# Patient Record
Sex: Female | Born: 1995 | Race: Black or African American | Hispanic: No | Marital: Single | Smoking: Never smoker
Health system: Southern US, Community
[De-identification: ages and names within clinical notes are randomized; demographics above are authoritative.]

## PROBLEM LIST (undated history)

## (undated) ENCOUNTER — Inpatient Hospital Stay (HOSPITAL_COMMUNITY): Payer: Self-pay

## (undated) ENCOUNTER — Ambulatory Visit

## (undated) DIAGNOSIS — F32A Depression, unspecified: Secondary | ICD-10-CM

## (undated) DIAGNOSIS — R87629 Unspecified abnormal cytological findings in specimens from vagina: Secondary | ICD-10-CM

## (undated) DIAGNOSIS — F419 Anxiety disorder, unspecified: Secondary | ICD-10-CM

## (undated) DIAGNOSIS — Z789 Other specified health status: Secondary | ICD-10-CM

## (undated) HISTORY — DX: Unspecified abnormal cytological findings in specimens from vagina: R87.629

## (undated) HISTORY — PX: NO PAST SURGERIES: SHX2092

## (undated) HISTORY — DX: Anxiety disorder, unspecified: F41.9

## (undated) HISTORY — DX: Depression, unspecified: F32.A

## (undated) HISTORY — PX: CERVICAL BIOPSY: SHX590

---
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
## 2016-10-22 ENCOUNTER — Ambulatory Visit: Payer: Medicaid Other | Admitting: Obstetrics and Gynecology

## 2016-11-12 ENCOUNTER — Ambulatory Visit: Payer: Medicaid Other | Admitting: Obstetrics and Gynecology

## 2016-12-15 ENCOUNTER — Inpatient Hospital Stay (HOSPITAL_COMMUNITY)
Admission: AD | Admit: 2016-12-15 | Discharge: 2016-12-15 | Disposition: A | Discharge status: Home / Self Care | Payer: Medicaid Other | Source: Ambulatory Visit | Attending: Obstetrics and Gynecology | Admitting: Obstetrics and Gynecology

## 2016-12-15 ENCOUNTER — Encounter (HOSPITAL_COMMUNITY): Payer: Self-pay | Admitting: *Deleted

## 2016-12-15 DIAGNOSIS — D649 Anemia, unspecified: Secondary | ICD-10-CM | POA: Insufficient documentation

## 2016-12-15 DIAGNOSIS — Z880 Allergy status to penicillin: Secondary | ICD-10-CM | POA: Insufficient documentation

## 2016-12-15 DIAGNOSIS — N939 Abnormal uterine and vaginal bleeding, unspecified: Secondary | ICD-10-CM | POA: Insufficient documentation

## 2016-12-15 LAB — URINALYSIS, ROUTINE W REFLEX MICROSCOPIC
BILIRUBIN URINE: NEGATIVE
Glucose, UA: NEGATIVE mg/dL
Ketones, ur: NEGATIVE mg/dL
LEUKOCYTES UA: NEGATIVE
NITRITE: NEGATIVE
Protein, ur: NEGATIVE mg/dL
SPECIFIC GRAVITY, URINE: 1.02 (ref 1.005–1.030)
pH: 5 (ref 5.0–8.0)

## 2016-12-15 LAB — CBC
HCT: 34.6 % — ABNORMAL LOW (ref 36.0–46.0)
HEMOGLOBIN: 11.5 g/dL — AB (ref 12.0–15.0)
MCH: 24.4 pg — AB (ref 26.0–34.0)
MCHC: 33.2 g/dL (ref 30.0–36.0)
MCV: 73.3 fL — ABNORMAL LOW (ref 78.0–100.0)
Platelets: 439 10*3/uL — ABNORMAL HIGH (ref 150–400)
RBC: 4.72 MIL/uL (ref 3.87–5.11)
RDW: 16.6 % — ABNORMAL HIGH (ref 11.5–15.5)
WBC: 5.8 10*3/uL (ref 4.0–10.5)

## 2016-12-15 LAB — WET PREP, GENITAL
Clue Cells Wet Prep HPF POC: NONE SEEN
SPERM: NONE SEEN
Trich, Wet Prep: NONE SEEN
Yeast Wet Prep HPF POC: NONE SEEN

## 2016-12-15 LAB — POCT PREGNANCY, URINE: PREG TEST UR: NEGATIVE

## 2016-12-15 MED ORDER — NORETHINDRONE-ETH ESTRADIOL 0.5-35 MG-MCG PO TABS: 1.0000 | ORAL_TABLET | Freq: Every day | ORAL | 0 refills | Status: DC

## 2016-12-15 NOTE — MAU Note (Addendum)
Has been feeling really weak and dizzy the last 2 days.  Has been bleeding a lot this month, has had "multiple periods". Was dx with a UTI a year ago, her momma had lost her card and she wasn't able to get the medication.  (gait steady and even when walking, did not require assistance)

## 2016-12-15 NOTE — Discharge Instructions (Signed)
Get your pills at the pharmacy and take one every day. Condoms for protections from sexually transmitted infections and protection from pregnancy. Drink at least 8 8-oz glasses of water every day. You can take ibuprofen 200 mg - take 3 tablets by mouth every 6 hours with food. Take a multivitamin every day. Eat 3 meals a day with healthy foods. Return if the bleeding worsens. [facility].

## 2016-12-15 NOTE — MAU Provider Note (Signed)
History     CSN: [id number]  Arrival date and time: 12/15/16 1721   First Provider Initiated Contact with Patient 12/15/16 1813      Chief Complaint  Patient presents with  . Vaginal Bleeding   HPI Rhonda Evans 21 y.o.  Comes to MAU today with heavy vaginal bleeding for the third time this month.  Was feeling weak and dizzy earlier today but now is OK.  Has not had a meal today but only a few bites of snacks today.  Bled first this month on the 12th, and then twice more for approx 3 days at a time.  Uses condoms (but not always) for contraception.  OB History    Gravida Para Term Preterm AB Living   1 1 1     1    SAB TAB Ectopic Multiple Live Births                  Past Medical History:  Diagnosis Date  . Medical history non-contributory     Past Surgical History:  Procedure Laterality Date  . NO PAST SURGERIES      Family History  Problem Relation Age of Onset  . Diabetes Maternal Grandfather   . Hypertension Maternal Grandfather   . Cancer Maternal Grandfather   . Cancer Paternal Grandfather   . Hypertension Maternal Aunt   . Diabetes Maternal Grandmother        great  . Hypertension Maternal Grandmother        great  . Hearing loss Neg Hx     Social History  Substance Use Topics  . Smoking status: Never Smoker  . Smokeless tobacco: Never Used  . Alcohol use No    Allergies:  Allergies  Allergen Reactions  . Amoxicillin Anaphylaxis and Rash    Has patient had a PCN reaction causing immediate rash, facial/tongue/throat swelling, SOB or lightheadedness with hypotension: Yes Has patient had a PCN reaction causing severe rash involving mucus membranes or skin necrosis: No Has patient had a PCN reaction that required hospitalization Yes Has patient had a PCN reaction occurring within the last 10 years: No If all of the above answers are "NO", then may proceed with Cephalosporin use.     Prescriptions Prior to Admission  Medication Sig Dispense  Refill Last Dose  . sulfamethoxazole-trimethoprim (BACTRIM DS,SEPTRA DS) 800-160 MG tablet Take 1 tablet by mouth 2 (two) times daily. 10 tablet 0   this was prescribed one year ago and she never got it filled due to a problem with her insurance.  Review of Systems  Constitutional: Negative for fever.  Gastrointestinal: Negative for nausea and vomiting.       Mild abdominal pain with the bleeding.  Genitourinary: Positive for vaginal bleeding. Negative for dysuria, urgency and vaginal discharge.   Physical Exam   Blood pressure 123/78, pulse 81, temperature 98.1 F (36.7 C), temperature source Oral, resp. rate 16, height 5\' 5"  (1.651 m), weight 161 lb 8 oz (73.3 kg), last menstrual period 12/13/2016, SpO2 100 %, unknown if currently breastfeeding.  Physical Exam  Nursing note and vitals reviewed. Constitutional: She is oriented to person, place, and time. She appears well-developed and well-nourished.  HENT:  Head: Normocephalic.  Eyes: EOM are normal.  Neck: Neck supple.  GI: Soft. There is no tenderness.  Genitourinary:  Genitourinary Comments: Speculum exam: Vagina - Mod amount of dark blood Cervix - small amount of active bleeding Bimanual exam: Cervix closed Uterus non tender, normal size Adnexa non tender,  no masses bilaterally GC/Chlam, wet prep done Chaperone present for exam.   Musculoskeletal: Normal range of motion.  Neurological: She is alert and oriented to person, place, and time.  Skin: Skin is warm and dry.  Psychiatric: She has a normal mood and affect.    MAU Course  Procedures Results for orders placed or performed during the hospital encounter of 12/15/16 (from the past 24 hour(s))  Urinalysis, Routine w reflex microscopic     Status: Abnormal   Collection Time: 12/15/16  5:37 PM  Result Value Ref Range   Color, Urine YELLOW YELLOW   APPearance CLEAR CLEAR   Specific Gravity, Urine 1.020 1.005 - 1.030   pH 5.0 5.0 - 8.0   Glucose, UA NEGATIVE  NEGATIVE mg/dL   Hgb urine dipstick LARGE (A) NEGATIVE   Bilirubin Urine NEGATIVE NEGATIVE   Ketones, ur NEGATIVE NEGATIVE mg/dL   Protein, ur NEGATIVE NEGATIVE mg/dL   Nitrite NEGATIVE NEGATIVE   Leukocytes, UA NEGATIVE NEGATIVE   RBC / HPF TOO NUMEROUS TO COUNT 0 - 5 RBC/hpf   WBC, UA 0-5 0 - 5 WBC/hpf   Bacteria, UA RARE (A) NONE SEEN   Squamous Epithelial / LPF 0-5 (A) NONE SEEN   Mucous PRESENT   Pregnancy, urine POC     Status: None   Collection Time: 12/15/16  5:45 PM  Result Value Ref Range   Preg Test, Ur NEGATIVE NEGATIVE  CBC     Status: Abnormal   Collection Time: 12/15/16  6:11 PM  Result Value Ref Range   WBC 5.8 4.0 - 10.5 K/uL   RBC 4.72 3.87 - 5.11 MIL/uL   Hemoglobin 11.5 (L) 12.0 - 15.0 g/dL   HCT 16.134.6 (L) 09.636.0 - 04.546.0 %   MCV 73.3 (L) 78.0 - 100.0 fL   MCH 24.4 (L) 26.0 - 34.0 pg   MCHC 33.2 30.0 - 36.0 g/dL   RDW 40.916.6 (H) 81.111.5 - 91.415.5 %   Platelets 439 (H) 150 - 400 K/uL  Wet prep, genital     Status: Abnormal   Collection Time: 12/15/16  6:12 PM  Result Value Ref Range   Yeast Wet Prep HPF POC NONE SEEN NONE SEEN   Trich, Wet Prep NONE SEEN NONE SEEN   Clue Cells Wet Prep HPF POC NONE SEEN NONE SEEN   WBC, Wet Prep HPF POC FEW (A) NONE SEEN   Sperm NONE SEEN     MDM Advised client she does not have a UTI and does not need a blood transfusion.  Likely the dizziness and weakness she had today is from lack of good nutrition and lack of fluids today.  Discussed the way to manage this episode of abnormal bleeding with birth control pills.  [facility]  Assessment and Plan  Abnormal vaginal bleeding Mild anemia  Plan Will prescribe birth control pills (x2 packs) to help the bleeding to stop.   Get your pills at the pharmacy and take one every day. Condoms for protections from sexually transmitted infections and protection from pregnancy. Drink at least 8 8-oz glasses of water every day. You can take ibuprofen 200 mg -  take 3 tablets by mouth every 6 hours with food. Take a multivitamin every day. Eat 3 meals a day with healthy foods. Return if the bleeding worsens. Labs pending. We will call you if you need any additional treatment. [facility] for further evaluation.  Maram Bently L Abrar Koone 12/15/2016, 6:15 PM

## 2016-12-16 LAB — HIV ANTIBODY (ROUTINE TESTING W REFLEX): HIV Screen 4th Generation wRfx: NONREACTIVE

## 2016-12-16 LAB — RPR: RPR Ser Ql: NONREACTIVE

## 2016-12-17 LAB — GC/CHLAMYDIA PROBE AMP (~~LOC~~) NOT AT ARMC
CHLAMYDIA, DNA PROBE: POSITIVE — AB
NEISSERIA GONORRHEA: NEGATIVE

## 2016-12-19 ENCOUNTER — Other Ambulatory Visit: Payer: Self-pay | Admitting: Student

## 2016-12-19 ENCOUNTER — Telehealth (HOSPITAL_COMMUNITY): Payer: Self-pay

## 2016-12-19 DIAGNOSIS — A749 Chlamydial infection, unspecified: Secondary | ICD-10-CM

## 2016-12-19 MED ORDER — AZITHROMYCIN 500 MG PO TABS: 1000.0000 mg | ORAL_TABLET | Freq: Once | ORAL | 0 refills | Status: AC

## 2016-12-19 NOTE — Telephone Encounter (Signed)

## 2017-05-27 ENCOUNTER — Encounter (HOSPITAL_COMMUNITY): Payer: Self-pay | Admitting: *Deleted

## 2017-05-27 ENCOUNTER — Inpatient Hospital Stay (HOSPITAL_COMMUNITY)
Admission: AD | Admit: 2017-05-27 | Discharge: 2017-05-27 | Disposition: A | Discharge status: Home / Self Care | Payer: Medicaid Other | Source: Ambulatory Visit | Attending: Family Medicine | Admitting: Family Medicine

## 2017-05-27 DIAGNOSIS — Z8249 Family history of ischemic heart disease and other diseases of the circulatory system: Secondary | ICD-10-CM | POA: Insufficient documentation

## 2017-05-27 DIAGNOSIS — A5602 Chlamydial vulvovaginitis: Secondary | ICD-10-CM | POA: Insufficient documentation

## 2017-05-27 DIAGNOSIS — Z3202 Encounter for pregnancy test, result negative: Secondary | ICD-10-CM | POA: Insufficient documentation

## 2017-05-27 DIAGNOSIS — Z822 Family history of deafness and hearing loss: Secondary | ICD-10-CM | POA: Insufficient documentation

## 2017-05-27 DIAGNOSIS — B9689 Other specified bacterial agents as the cause of diseases classified elsewhere: Secondary | ICD-10-CM | POA: Diagnosis not present

## 2017-05-27 DIAGNOSIS — Z8619 Personal history of other infectious and parasitic diseases: Secondary | ICD-10-CM

## 2017-05-27 DIAGNOSIS — Z809 Family history of malignant neoplasm, unspecified: Secondary | ICD-10-CM | POA: Diagnosis not present

## 2017-05-27 DIAGNOSIS — Z833 Family history of diabetes mellitus: Secondary | ICD-10-CM | POA: Insufficient documentation

## 2017-05-27 DIAGNOSIS — N760 Acute vaginitis: Secondary | ICD-10-CM

## 2017-05-27 DIAGNOSIS — Z880 Allergy status to penicillin: Secondary | ICD-10-CM | POA: Insufficient documentation

## 2017-05-27 DIAGNOSIS — A5402 Gonococcal vulvovaginitis, unspecified: Secondary | ICD-10-CM | POA: Insufficient documentation

## 2017-05-27 DIAGNOSIS — N898 Other specified noninflammatory disorders of vagina: Secondary | ICD-10-CM

## 2017-05-27 LAB — URINALYSIS, ROUTINE W REFLEX MICROSCOPIC
Bilirubin Urine: NEGATIVE
GLUCOSE, UA: NEGATIVE mg/dL
Ketones, ur: NEGATIVE mg/dL
NITRITE: NEGATIVE
PH: 5 (ref 5.0–8.0)
Protein, ur: NEGATIVE mg/dL
SPECIFIC GRAVITY, URINE: 1.021 (ref 1.005–1.030)

## 2017-05-27 LAB — WET PREP, GENITAL
Sperm: NONE SEEN
Trich, Wet Prep: NONE SEEN
YEAST WET PREP: NONE SEEN

## 2017-05-27 LAB — POCT PREGNANCY, URINE: Preg Test, Ur: NEGATIVE

## 2017-05-27 MED ORDER — AZITHROMYCIN 250 MG PO TABS: ORAL_TABLET | ORAL | 0 refills | Status: DC

## 2017-05-27 MED ORDER — METRONIDAZOLE 500 MG PO TABS: 500.0000 mg | ORAL_TABLET | Freq: Two times a day (BID) | ORAL | 0 refills | Status: DC

## 2017-05-27 NOTE — MAU Provider Note (Signed)
History     CSN: [id number]  Arrival date and time: 05/27/17 1306   First Provider Initiated Contact with Patient 05/27/17 1338      Chief Complaint  Patient presents with  . Possible Pregnancy  . Vaginal Discharge   HPI .  Rhonda Evans presents for evaluation of vaginal discharge and odor for several weeks.  Recent hx of + Chlamydia, treated.  Boyfriend is aware and she cannot be sure he was treated "because he didn't talk about medication".  She is not currently using OCPs, that she has had in the past.  She is having mild nausea.  Hx of irregular cycles, LMP over a month ago.  Not sure if she pregnant.     Past Medical History:  Diagnosis Date  . Medical history non-contributory     Past Surgical History:  Procedure Laterality Date  . NO PAST SURGERIES      Family History  Problem Relation Age of Onset  . Diabetes Maternal Grandfather   . Hypertension Maternal Grandfather   . Cancer Maternal Grandfather   . Cancer Paternal Grandfather   . Hypertension Maternal Aunt   . Diabetes Maternal Grandmother        great  . Hypertension Maternal Grandmother        great  . Hearing loss Neg Hx     Social History   Tobacco Use  . Smoking status: Never Smoker  . Smokeless tobacco: Never Used  Substance Use Topics  . Alcohol use: No  . Drug use: No    Allergies:  Allergies  Allergen Reactions  . Amoxicillin Anaphylaxis and Rash    Has patient had a PCN reaction causing immediate rash, facial/tongue/throat swelling, SOB or lightheadedness with hypotension: Yes Has patient had a PCN reaction causing severe rash involving mucus membranes or skin necrosis: No Has patient had a PCN reaction that required hospitalization Yes Has patient had a PCN reaction occurring within the last 10 years: No If all of the above answers are "NO", then may proceed with Cephalosporin use.     Medications Prior to Admission  Medication Sig Dispense Refill Last Dose  .  norethindrone-ethinyl estradiol (NECON,BREVICON,MODICON) 0.5-35 MG-MCG tablet Take 1 tablet by mouth daily. (Patient not taking: Reported on 05/27/2017) 2 Package 0 Not Taking at Unknown time    Review of Systems  Constitutional: Negative for chills and fever.  Respiratory: Negative for shortness of breath.   Cardiovascular: Negative for chest pain.  Gastrointestinal: Positive for nausea (mild). Negative for abdominal pain, diarrhea and vomiting.  Genitourinary: Positive for vaginal discharge (with odor). Negative for difficulty urinating, dysuria, frequency, hematuria, pelvic pain and vaginal bleeding.   Physical Exam   Blood pressure 127/76, pulse 94, temperature 98.6 F (37 C), temperature source Oral, resp. rate 16, height 5\' 5"  (1.651 m), weight 165 lb (74.8 kg), SpO2 100 %, unknown if currently breastfeeding.  Physical Exam  Nursing note and vitals reviewed. Constitutional: She is oriented to person, place, and time. She appears well-developed and well-nourished. No distress.  HENT:  Head: Normocephalic.  Neck: Normal range of motion.  Cardiovascular: Normal rate.  Respiratory: Effort normal.  GI: Soft. She exhibits no distension and no mass. There is no tenderness. There is no rebound and no guarding.  Genitourinary: There is no rash, tenderness or lesion on the right labia. There is no rash, tenderness or lesion on the left labia. Uterus is not enlarged and not tender. Cervix exhibits no motion tenderness and no discharge. Right  adnexum displays no mass, no tenderness and no fullness. Left adnexum displays no mass, no tenderness and no fullness. No erythema, tenderness or bleeding in the vagina. No foreign body in the vagina. Vaginal discharge (moderate amount of white frothy discharge with mild odor) found.  Neurological: She is alert and oriented to person, place, and time.  Skin: Skin is warm and dry.  Psychiatric: She has a normal mood and affect. Her behavior is normal. Thought  content normal.   Results for orders placed or performed during the hospital encounter of 05/27/17 (from the past 24 hour(s))  Urinalysis, Routine w reflex microscopic     Status: Abnormal   Collection Time: 05/27/17  1:09 PM  Result Value Ref Range   Color, Urine YELLOW YELLOW   APPearance HAZY (A) CLEAR   Specific Gravity, Urine 1.021 1.005 - 1.030   pH 5.0 5.0 - 8.0   Glucose, UA NEGATIVE NEGATIVE mg/dL   Hgb urine dipstick SMALL (A) NEGATIVE   Bilirubin Urine NEGATIVE NEGATIVE   Ketones, ur NEGATIVE NEGATIVE mg/dL   Protein, ur NEGATIVE NEGATIVE mg/dL   Nitrite NEGATIVE NEGATIVE   Leukocytes, UA LARGE (A) NEGATIVE   RBC / HPF 0-5 0 - 5 RBC/hpf   WBC, UA TOO NUMEROUS TO COUNT 0 - 5 WBC/hpf   Bacteria, UA FEW (A) NONE SEEN   Squamous Epithelial / LPF 6-30 (A) NONE SEEN   Mucus PRESENT   Pregnancy, urine POC     Status: None   Collection Time: 05/27/17  1:25 PM  Result Value Ref Range   Preg Test, Ur NEGATIVE NEGATIVE  Wet prep, genital     Status: Abnormal   Collection Time: 05/27/17  3:00 PM  Result Value Ref Range   Yeast Wet Prep HPF POC NONE SEEN NONE SEEN   Trich, Wet Prep NONE SEEN NONE SEEN   Clue Cells Wet Prep HPF POC PRESENT (A) NONE SEEN   WBC, Wet Prep HPF POC MANY (A) NONE SEEN   Sperm NONE SEEN    MAU Course  Procedures  GC/CHL culture and HIV pending  MDM MSE Exam Labs Discussed with the patient retreating for Chlamydia with the concern her partner may not hve been treated vs in case he was treated, waiting for the culture result.  She would like to be treated  Assessment and Plan  A:  Vaginal discharge with odor      Bacterial vaginosis      Recent dx of Chlamydia, elsewhere    P:  Rx for Flagyl and Zithromax to pharmacy        ETOH warning with Flagyl      Will call for + culture results      Pelvic rest until 1 week after partner is treated if + culture     Encouraged contraception  Eve M Derrisha Foos 05/27/2017, 3:32 PM

## 2017-05-27 NOTE — MAU Note (Cosign Needed)
History     CSN: [id number]  Arrival date and time: 05/27/17 1306   First Provider Initiated Contact with Patient 05/27/17 1338      Chief Complaint  Patient presents with  . Possible Pregnancy  . Vaginal Discharge    21 y.o G1P1 female with prior history of Chlaymdia infection presents today with complaints of vaginal discharge and odor with intermittent nausea. States this has all been going on for "several weeks, maybe a month." Reports discharge has increased in amount, described as white/clear in color. Patient is sexually active and was treated for Chlamydia recently but states she did not wait the appropriate time before engaging in intercourse again. Denies any form of birth control currently. Patient is unsure if partner was treated, stating he said she went but she isn't sure if he took medication. Patient does not know LMP, but reports it to be irregular and heavy. Denies fever, chills, shortness of breath, abdominal pain, diarrhea, constipation, vaginal bleeding, vaginal itching, hematuria, or dysuria.   OB History    Gravida Para Term Preterm AB Living   1 1 1     1    SAB TAB Ectopic Multiple Live Births                  Past Medical History:  Diagnosis Date  . Medical history non-contributory     Past Surgical History:  Procedure Laterality Date  . NO PAST SURGERIES      Family History  Problem Relation Age of Onset  . Diabetes Maternal Grandfather   . Hypertension Maternal Grandfather   . Cancer Maternal Grandfather   . Cancer Paternal Grandfather   . Hypertension Maternal Aunt   . Diabetes Maternal Grandmother        great  . Hypertension Maternal Grandmother        great  . Hearing loss Neg Hx     Social History   Tobacco Use  . Smoking status: Never Smoker  . Smokeless tobacco: Never Used  Substance Use Topics  . Alcohol use: No  . Drug use: No    Allergies:  Allergies  Allergen Reactions  . Amoxicillin Anaphylaxis and Rash    Has  patient had a PCN reaction causing immediate rash, facial/tongue/throat swelling, SOB or lightheadedness with hypotension: Yes Has patient had a PCN reaction causing severe rash involving mucus membranes or skin necrosis: No Has patient had a PCN reaction that required hospitalization Yes Has patient had a PCN reaction occurring within the last 10 years: No If all of the above answers are "NO", then may proceed with Cephalosporin use.     Medications Prior to Admission  Medication Sig Dispense Refill Last Dose  . norethindrone-ethinyl estradiol (NECON,BREVICON,MODICON) 0.5-35 MG-MCG tablet Take 1 tablet by mouth daily. (Patient not taking: Reported on 05/27/2017) 2 Package 0 Not Taking at Unknown time    Review of Systems  Constitutional: Negative for chills, fatigue and fever.  Respiratory: Negative for chest tightness and shortness of breath.   Cardiovascular: Negative for palpitations and leg swelling.  Gastrointestinal: Positive for nausea. Negative for abdominal pain, constipation, diarrhea and vomiting.  Genitourinary: Positive for vaginal discharge. Negative for dysuria, hematuria, pelvic pain and vaginal bleeding.       Positive for Vaginal odor.   All other systems reviewed and are negative.  Physical Exam   Blood pressure 127/76, pulse 94, temperature 98.6 F (37 C), temperature source Oral, resp. rate 16, height 5\' 5"  (1.651 m), weight 74.8 kg (  165 lb), SpO2 100 %, unknown if currently breastfeeding.  Physical Exam  Constitutional: She appears well-developed and well-nourished.  HENT:  Head: Normocephalic and atraumatic.  Cardiovascular: Normal rate.  Respiratory: Effort normal.  GI: Soft. There is no tenderness.  Genitourinary: Cervix exhibits no motion tenderness. Right adnexum displays no mass and no tenderness. Left adnexum displays no mass and no tenderness. Vaginal discharge found.  Genitourinary Comments: RN chaperone present.   Skin: Skin is warm and dry.      Results for orders placed or performed during the hospital encounter of 05/27/17 (from the past 24 hour(s))  Urinalysis, Routine w reflex microscopic     Status: Abnormal   Collection Time: 05/27/17  1:09 PM  Result Value Ref Range   Color, Urine YELLOW YELLOW   APPearance HAZY (A) CLEAR   Specific Gravity, Urine 1.021 1.005 - 1.030   pH 5.0 5.0 - 8.0   Glucose, UA NEGATIVE NEGATIVE mg/dL   Hgb urine dipstick SMALL (A) NEGATIVE   Bilirubin Urine NEGATIVE NEGATIVE   Ketones, ur NEGATIVE NEGATIVE mg/dL   Protein, ur NEGATIVE NEGATIVE mg/dL   Nitrite NEGATIVE NEGATIVE   Leukocytes, UA LARGE (A) NEGATIVE   RBC / HPF 0-5 0 - 5 RBC/hpf   WBC, UA TOO NUMEROUS TO COUNT 0 - 5 WBC/hpf   Bacteria, UA FEW (A) NONE SEEN   Squamous Epithelial / LPF 6-30 (A) NONE SEEN   Mucus PRESENT   Pregnancy, urine POC     Status: None   Collection Time: 05/27/17  1:25 PM  Result Value Ref Range   Preg Test, Ur NEGATIVE NEGATIVE    MAU Course  Procedures G/C culture: Pending   MDM  Assessment and Plan  Vaginal discharge and odor: G/C cultures pending. Patient informed she would receive a call with those results. If positive, patient encouraged to seek treatment for herself and her partner and to remain abstinent x1 week following appropriate treatment. Patient also encouraged to seek out a method of contraception.   Felisa BonierBianca Seivley 05/27/2017, 2:55 PM

## 2017-05-27 NOTE — Discharge Instructions (Signed)
Bacterial Vaginosis Bacterial vaginosis is an infection of the vagina. It happens when too many germs (bacteria) grow in the vagina. This infection puts you at risk for infections from sex (STIs). Treating this infection can lower your risk for some STIs. You should also treat this if you are pregnant. It can cause your baby to be born early. Follow these instructions at home: Medicines  Take over-the-counter and prescription medicines only as told by your doctor.  Take or use your antibiotic medicine as told by your doctor. Do not stop taking or using it even if you start to feel better. General instructions  If you your sexual partner is a woman, tell her that you have this infection. She needs to get treatment if she has symptoms. If you have a female partner, he does not need to be treated.  During treatment: ? Avoid sex. ? Do not douche. ? Avoid alcohol as told. ? Avoid breastfeeding as told.  Drink enough fluid to keep your pee (urine) clear or pale yellow.  Keep your vagina and butt (rectum) clean. ? Wash the area with warm water every day. ? Wipe from front to back after you use the toilet.  Keep all follow-up visits as told by your doctor. This is important. Preventing this condition  Do not douche.  Use only warm water to wash around your vagina.  Use protection when you have sex. This includes: ? Latex condoms. ? Dental dams.  Limit how many people you have sex with. It is best to only have sex with the same person (be monogamous).  Get tested for STIs. Have your partner get tested.  Wear underwear that is cotton or lined with cotton.  Avoid tight pants and pantyhose. This is most important in summer.  Do not use any products that have nicotine or tobacco in them. These include cigarettes and e-cigarettes. If you need help quitting, ask your doctor.  Do not use illegal drugs.  Limit how much alcohol you drink. Contact a doctor if:  Your symptoms do not get  better, even after you are treated.  You have more discharge or pain when you pee (urinate).  You have a fever.  You have pain in your belly (abdomen).  You have pain with sex.  Your bleed from your vagina between periods. Summary  This infection happens when too many germs (bacteria) grow in the vagina.  Treating this condition can lower your risk for some infections from sex (STIs).  You should also treat this if you are pregnant. It can cause early (premature) birth.  Do not stop taking or using your antibiotic medicine even if you start to feel better. This information is not intended to replace advice given to you by your health care provider. Make sure you discuss any questions you have with your health care provider. Document Released: 03/20/2008 Document Revised: 02/25/2016 Document Reviewed: 02/25/2016 Elsevier Interactive Patient Education  2017 [employer]  Chlamydia, Female Chlamydia is an STD (sexually transmitted disease). It is a bacterial infection that spreads (is contagious) through sexual contact. Chlamydia can occur in different areas of the body, including:  The tube that moves urine from the bladder out of the body (urethra).  The lower part of the uterus (cervix).  The throat.  The rectum.  This condition is not difficult to treat. However, if left untreated, chlamydia can lead to more serious health problems, including pelvic inflammatory disorder (PID). PID can increase your risk of not being able to  have children (sterility). What are the causes? Chlamydia is caused by the bacteria Chlamydia trachomatis. It is passed from an infected partner during sexual activity. Chlamydia can spread through contact with the genitals, mouth, or rectum. What are the signs or symptoms? In some cases, there may not be any symptoms for this condition (asymptomatic), especially early in the infection. If symptoms develop, they may include:  Burning with  urination.  Frequent urination.  Vaginal discharge.  Redness, soreness, and swelling (inflammation) of the rectum.  Bleeding or discharge from the rectum.  Abdominal pain.  Pain during sexual intercourse.  Bleeding between menstrual periods.  Itching, burning, or redness in the eyes, or discharge from the eyes.  How is this diagnosed? This condition may be diagnosed with:  Urine tests.  Swab tests. Depending on your symptoms, your health care provider may use a cotton swab to collect discharge from your vagina or rectum to test for the bacteria.  A pelvic exam.  How is this treated? This condition is treated with antibiotic medicines. If you are pregnant, certain types of antibiotics will need to be avoided. Follow these instructions at home: Medicines  Take over-the-counter and prescription medicines only as told by your health care provider.  Take your antibiotic medicine as told by your health care provider. Do not stop taking the antibiotic even if you start to feel better. Sexual activity  Tell sexual partners about your infection. This includes any oral, anal, or vaginal sex partners you have had within 60 days of when your symptoms started. Sexual partners should also be treated, even if they have no signs of the disease.  Do not have sex until you and your sexual partners have completed treatment and your health care provider says it is okay. If your health care provider prescribed you a single dose treatment, wait 7 days after taking the treatment before having sex. General instructions  It is your responsibility to get your test results. Ask your health care provider, or the department performing the test, when your results will be ready.  Get plenty of rest.  Eat a healthy, well-balanced diet.  Drink enough fluids to keep your urine clear or pale yellow.  Keep all follow-up visits as told by your health care provider. This is important. You may need to be  tested for infection again 3 months after treatment. How is this prevented? The only sure way to prevent chlamydia is to avoid having sex. However, you can lower your risk by:  Using latex condoms correctly every time you have sex.  Not having multiple sexual partners.  Asking if your sexual partner has been tested for STIs and had negative results.  Contact a health care provider if:  You develop new symptoms or your symptoms do not get better after completing treatment.  You have a fever or chills.  You have pain during sexual intercourse. Get help right away if:  Your pain gets worse and does not get better with medicine.  You develop flu-like symptoms, such as night sweats, sore throat, or muscle aches.  You experience nausea or vomiting.  You have difficulty swallowing.  You have bleeding between periods or after sex.  You have irregular menstrual periods.  You have abdominal or lower back pain that does not get better with medicine.  You feel weak or dizzy, or you faint.  You are pregnant and you develop symptoms of chlamydia. Summary  Chlamydia is an STD (sexually transmitted disease). It is a bacterial infection  that spreads (is contagious) through sexual contact.  This condition is not difficult to treat, however. If left untreated, chlamydia can lead to more serious health problems, including pelvic inflammatory disease (PID).  In some cases, there may not be any symptoms for this condition (asymptomatic).  This condition is treated with antibiotic medicines.  Using latex condoms correctly every time you have sex can help prevent chlamydia. This information is not intended to replace advice given to you by your health care provider. Make sure you discuss any questions you have with your health care provider. Document Released: 03/21/2005 Document Revised: 05/28/2016 Document Reviewed: 05/28/2016 Elsevier Interactive Patient Education  2017 [employer]

## 2017-05-27 NOTE — MAU Note (Signed)
Pt reports discharge with a odor and wants to be checked for STD's. Denies pain. Also thinks she may be pregnant, reports nausea off/on.

## 2017-05-28 LAB — HIV ANTIBODY (ROUTINE TESTING W REFLEX): HIV SCREEN 4TH GENERATION: NONREACTIVE

## 2017-05-29 LAB — GC/CHLAMYDIA PROBE AMP (~~LOC~~) NOT AT ARMC
Chlamydia: POSITIVE — AB
NEISSERIA GONORRHEA: POSITIVE — AB

## 2017-07-19 ENCOUNTER — Encounter (HOSPITAL_COMMUNITY): Payer: Self-pay | Admitting: Emergency Medicine

## 2017-07-19 ENCOUNTER — Emergency Department (HOSPITAL_COMMUNITY): Payer: Medicaid Other

## 2017-07-19 DIAGNOSIS — S93401A Sprain of unspecified ligament of right ankle, initial encounter: Secondary | ICD-10-CM | POA: Diagnosis not present

## 2017-07-19 DIAGNOSIS — Y999 Unspecified external cause status: Secondary | ICD-10-CM | POA: Insufficient documentation

## 2017-07-19 DIAGNOSIS — Y929 Unspecified place or not applicable: Secondary | ICD-10-CM | POA: Insufficient documentation

## 2017-07-19 DIAGNOSIS — W108XXA Fall (on) (from) other stairs and steps, initial encounter: Secondary | ICD-10-CM | POA: Insufficient documentation

## 2017-07-19 DIAGNOSIS — Y939 Activity, unspecified: Secondary | ICD-10-CM | POA: Insufficient documentation

## 2017-07-19 DIAGNOSIS — S99911A Unspecified injury of right ankle, initial encounter: Secondary | ICD-10-CM | POA: Diagnosis present

## 2017-07-19 NOTE — ED Triage Notes (Signed)
Pt reports she tripped and fell down 3-4 stairs. Pt reports R ankle pain. Pt is ambulatory.

## 2017-07-20 ENCOUNTER — Emergency Department (HOSPITAL_COMMUNITY)
Admission: EM | Admit: 2017-07-20 | Discharge: 2017-07-20 | Disposition: A | Discharge status: Home / Self Care | Payer: Medicaid Other | Attending: Emergency Medicine | Admitting: Emergency Medicine

## 2017-07-20 DIAGNOSIS — S93401A Sprain of unspecified ligament of right ankle, initial encounter: Secondary | ICD-10-CM

## 2017-07-20 MED ORDER — IBUPROFEN 600 MG PO TABS: 600.0000 mg | ORAL_TABLET | Freq: Four times a day (QID) | ORAL | 0 refills | Status: DC | PRN

## 2017-07-20 NOTE — ED Provider Notes (Signed)
[facility] EMERGENCY DEPARTMENT Provider Note   CSN: [id number] Arrival date & time: 07/19/17  2246     History   Chief Complaint Chief Complaint  Patient presents with  . Ankle Pain    HPI Rhonda Evans is a 22 y.o. female.  Patient presents to the emergency department with a chief complaint of right ankle pain.  She states that she tripped and fell down 3-4 stairs.  She injured her right ankle during the fall.  She complains of pain with palpation and movement.  It is difficult to ambulate on the affected ankle.  Did not take anything for symptoms.  Denies any other injuries.   The history is provided by the patient. No language interpreter was used.    Past Medical History:  Diagnosis Date  . Medical history non-contributory     There are no active problems to display for this patient.   Past Surgical History:  Procedure Laterality Date  . NO PAST SURGERIES      OB History    Gravida Para Term Preterm AB Living   1 1 1     1    SAB TAB Ectopic Multiple Live Births                   Home Medications    Prior to Admission medications   Medication Sig Start Date End Date Taking? Authorizing Provider  azithromycin (ZITHROMAX) 250 MG tablet Take all 4 tabs at once with food. 05/27/17   Key, Verita Schneiders, NP  metroNIDAZOLE (FLAGYL) 500 MG tablet Take 1 tablet (500 mg total) by mouth 2 (two) times daily. 05/27/17   KeyVerita Schneiders, NP  norethindrone-ethinyl estradiol (NECON,BREVICON,MODICON) 0.5-35 MG-MCG tablet Take 1 tablet by mouth daily. Patient not taking: Reported on 05/27/2017 12/15/16   Currie Paris, NP    Family History Family History  Problem Relation Age of Onset  . Diabetes Maternal Grandfather   . Hypertension Maternal Grandfather   . Cancer Maternal Grandfather   . Cancer Paternal Grandfather   . Hypertension Maternal Aunt   . Diabetes Maternal Grandmother        great  . Hypertension Maternal Grandmother        great  .  Hearing loss Neg Hx     Social History Social History   Tobacco Use  . Smoking status: Never Smoker  . Smokeless tobacco: Never Used  Substance Use Topics  . Alcohol use: No  . Drug use: No     Allergies   Amoxicillin   Review of Systems Review of Systems  All other systems reviewed and are negative.    Physical Exam Updated Vital Signs BP 125/82   Pulse (!) 115   Temp 99.2 F (37.3 C) (Oral)   Resp 16   Ht 5\' 5"  (1.651 m)   Wt 72.6 kg (160 lb)   LMP  (Within Months)   SpO2 95%   BMI 26.63 kg/m   Physical Exam Nursing note and vitals reviewed.  Constitutional: Pt appears well-developed and well-nourished. No distress.  HENT:  Head: Normocephalic and atraumatic.  Eyes: Conjunctivae are normal.  Neck: Normal range of motion.  Cardiovascular: Normal rate, regular rhythm. Intact distal pulses.   Capillary refill < 3 sec.  Pulmonary/Chest: Effort normal and breath sounds normal.  Musculoskeletal:  Right ankle Pt exhibits TTP about the right ankle without bony abnormality or deformity.   ROM: limited by pain  Strength: limited by pain  Neurological: Pt  is alert. Coordination normal.  Sensation: 5/5 Skin: Skin is warm and dry. Pt is not diaphoretic.  No evidence of open wound or skin tenting Psychiatric: Pt has a normal mood and affect.     ED Treatments / Results  Labs (all labs ordered are listed, but only abnormal results are displayed) Labs Reviewed - No data to display  EKG  EKG Interpretation None       Radiology Dg Ankle Complete Right  Result Date: 07/20/2017 CLINICAL DATA:  Larey SeatFell down the stairs now with ankle pain EXAM: RIGHT ANKLE - COMPLETE 3+ VIEW COMPARISON:  None. FINDINGS: There is no evidence of fracture, dislocation, or joint effusion. There is no evidence of arthropathy or other focal bone abnormality. Soft tissues are unremarkable. IMPRESSION: Negative. Electronically Signed   By: Jasmine PangKim  Fujinaga M.D.   On: 07/20/2017 00:04     Procedures Procedures (including critical care time)  Medications Ordered in ED Medications - No data to display   Initial Impression / Assessment and Plan / ED Course  I have reviewed the triage vital signs and the nursing notes.  Pertinent labs & imaging results that were available during my care of the patient were reviewed by me and considered in my medical decision making (see chart for details).     Patient presents with injury to right ankle.  DDx includes, fracture, strain, or sprain.  Plain films reveal no fracture or dislocation.  Pt advised to follow up with PCP and/or orthopedics. Patient given splint and crutches while in ED, conservative therapy such as RICE recommended and discussed.   Patient will be discharged home & is agreeable with above plan. Returns precautions discussed. Pt appears safe for discharge.   Final Clinical Impressions(s) / ED Diagnoses   Final diagnoses:  Sprain of right ankle, unspecified ligament, initial encounter    ED Discharge Orders        Ordered    ibuprofen (ADVIL,MOTRIN) 600 MG tablet  Every 6 hours PRN     07/20/17 0313       Roxy HorsemanBrowning, Javoni Lucken, PA-C 07/20/17 [id number]    Azalia Bilisampos, Kevin, MD 07/20/17 [phone]

## 2017-11-19 ENCOUNTER — Encounter (HOSPITAL_COMMUNITY): Payer: Self-pay | Admitting: Emergency Medicine

## 2017-11-19 ENCOUNTER — Emergency Department (HOSPITAL_COMMUNITY)
Admission: EM | Admit: 2017-11-19 | Discharge: 2017-11-20 | Disposition: A | Discharge status: Home / Self Care | Payer: Medicaid Other | Attending: Emergency Medicine | Admitting: Emergency Medicine

## 2017-11-19 DIAGNOSIS — Z5321 Procedure and treatment not carried out due to patient leaving prior to being seen by health care provider: Secondary | ICD-10-CM | POA: Diagnosis not present

## 2017-11-19 DIAGNOSIS — R1030 Lower abdominal pain, unspecified: Secondary | ICD-10-CM | POA: Insufficient documentation

## 2017-11-19 DIAGNOSIS — N898 Other specified noninflammatory disorders of vagina: Secondary | ICD-10-CM | POA: Insufficient documentation

## 2017-11-19 LAB — CBC
HCT: 35.1 % — ABNORMAL LOW (ref 36.0–46.0)
Hemoglobin: 11.5 g/dL — ABNORMAL LOW (ref 12.0–15.0)
MCH: 24.3 pg — ABNORMAL LOW (ref 26.0–34.0)
MCHC: 32.8 g/dL (ref 30.0–36.0)
MCV: 74.1 fL — ABNORMAL LOW (ref 78.0–100.0)
Platelets: 396 10*3/uL (ref 150–400)
RBC: 4.74 MIL/uL (ref 3.87–5.11)
RDW: 16 % — ABNORMAL HIGH (ref 11.5–15.5)
WBC: 10.9 10*3/uL — ABNORMAL HIGH (ref 4.0–10.5)

## 2017-11-19 LAB — URINALYSIS, ROUTINE W REFLEX MICROSCOPIC
Bacteria, UA: NONE SEEN
Bilirubin Urine: NEGATIVE
Glucose, UA: NEGATIVE mg/dL
Ketones, ur: NEGATIVE mg/dL
Nitrite: NEGATIVE
Protein, ur: NEGATIVE mg/dL
Specific Gravity, Urine: 1.027 (ref 1.005–1.030)
pH: 6 (ref 5.0–8.0)

## 2017-11-19 LAB — COMPREHENSIVE METABOLIC PANEL
ALT: 11 U/L — ABNORMAL LOW (ref 14–54)
AST: 16 U/L (ref 15–41)
Albumin: 3.6 g/dL (ref 3.5–5.0)
Alkaline Phosphatase: 75 U/L (ref 38–126)
Anion gap: 9 (ref 5–15)
BUN: 8 mg/dL (ref 6–20)
CO2: 26 mmol/L (ref 22–32)
Calcium: 9.5 mg/dL (ref 8.9–10.3)
Chloride: 105 mmol/L (ref 101–111)
Creatinine, Ser: 0.79 mg/dL (ref 0.44–1.00)
GFR calc Af Amer: >60 mL/min (ref >60)
GFR calc non Af Amer: >60 mL/min (ref >60)
Glucose, Bld: 79 mg/dL (ref 65–99)
Potassium: 3.7 mmol/L (ref 3.5–5.1)
Sodium: 140 mmol/L (ref 135–145)
Total Bilirubin: 0.2 mg/dL — ABNORMAL LOW (ref 0.3–1.2)
Total Protein: 7.5 g/dL (ref 6.5–8.1)

## 2017-11-19 LAB — I-STAT BETA HCG BLOOD, ED (MC, WL, AP ONLY): I-stat hCG, quantitative: <5 m[IU]/mL (ref <5)

## 2017-11-19 LAB — LIPASE, BLOOD: Lipase: 25 U/L (ref 11–51)

## 2017-11-19 NOTE — ED Triage Notes (Signed)
Pt reports lower abd pain X several weeks, intermittent, sharp in nature. Also reports abnormal vaginal DC.

## 2017-11-20 LAB — RPR: RPR Ser Ql: NONREACTIVE

## 2017-11-20 LAB — HIV ANTIBODY (ROUTINE TESTING W REFLEX): HIV Screen 4th Generation wRfx: NONREACTIVE

## 2017-11-20 NOTE — ED Notes (Signed)
Follow up call made   1215  11/20/17  s Monay Houlton rn

## 2017-11-20 NOTE — ED Notes (Signed)
No reply for vitals x4 pt not seen in lobby at this time.

## 2017-11-22 ENCOUNTER — Encounter (HOSPITAL_COMMUNITY): Payer: Self-pay | Admitting: *Deleted

## 2017-11-22 ENCOUNTER — Inpatient Hospital Stay (HOSPITAL_COMMUNITY)
Admission: AD | Admit: 2017-11-22 | Discharge: 2017-11-22 | Disposition: A | Discharge status: Home / Self Care | Payer: Medicaid Other | Source: Ambulatory Visit | Attending: Obstetrics and Gynecology | Admitting: Obstetrics and Gynecology

## 2017-11-22 DIAGNOSIS — Z880 Allergy status to penicillin: Secondary | ICD-10-CM | POA: Diagnosis not present

## 2017-11-22 DIAGNOSIS — N3000 Acute cystitis without hematuria: Secondary | ICD-10-CM | POA: Diagnosis not present

## 2017-11-22 DIAGNOSIS — N760 Acute vaginitis: Secondary | ICD-10-CM

## 2017-11-22 DIAGNOSIS — R109 Unspecified abdominal pain: Secondary | ICD-10-CM | POA: Diagnosis present

## 2017-11-22 DIAGNOSIS — B9689 Other specified bacterial agents as the cause of diseases classified elsewhere: Secondary | ICD-10-CM | POA: Diagnosis not present

## 2017-11-22 DIAGNOSIS — N730 Acute parametritis and pelvic cellulitis: Secondary | ICD-10-CM | POA: Diagnosis not present

## 2017-11-22 DIAGNOSIS — Z79899 Other long term (current) drug therapy: Secondary | ICD-10-CM | POA: Insufficient documentation

## 2017-11-22 LAB — URINALYSIS, ROUTINE W REFLEX MICROSCOPIC
BILIRUBIN URINE: NEGATIVE
GLUCOSE, UA: NEGATIVE mg/dL
Ketones, ur: NEGATIVE mg/dL
NITRITE: POSITIVE — AB
PH: 5 (ref 5.0–8.0)
Protein, ur: NEGATIVE mg/dL
SPECIFIC GRAVITY, URINE: 1.028 (ref 1.005–1.030)

## 2017-11-22 LAB — WET PREP, GENITAL
Sperm: NONE SEEN
Trich, Wet Prep: NONE SEEN
YEAST WET PREP: NONE SEEN

## 2017-11-22 LAB — POCT PREGNANCY, URINE: Preg Test, Ur: NEGATIVE

## 2017-11-22 MED ORDER — DIPHENHYDRAMINE HCL 25 MG PO CAPS
25.0000 mg | ORAL_CAPSULE | Freq: Once | ORAL | Status: AC
  Administered 2017-11-22: 25 mg via ORAL
  Filled 2017-11-22: qty 1

## 2017-11-22 MED ORDER — SECNIDAZOLE 2 G PO PACK: 2.0000 g | PACK | Freq: Once | ORAL | 0 refills | Status: AC

## 2017-11-22 MED ORDER — KETOROLAC TROMETHAMINE 60 MG/2ML IM SOLN
60.0000 mg | Freq: Once | INTRAMUSCULAR | Status: AC
  Administered 2017-11-22: 60 mg via INTRAMUSCULAR
  Filled 2017-11-22: qty 2

## 2017-11-22 MED ORDER — ACETAMINOPHEN 500 MG PO TABS
1000.0000 mg | ORAL_TABLET | Freq: Once | ORAL | Status: AC
Start: 2017-11-22 — End: 2017-11-22
  Administered 2017-11-22: 1000 mg via ORAL
  Filled 2017-11-22: qty 2

## 2017-11-22 MED ORDER — DOXYCYCLINE HYCLATE 100 MG PO TABS: 100.0000 mg | ORAL_TABLET | Freq: Two times a day (BID) | ORAL | 0 refills | Status: DC

## 2017-11-22 MED ORDER — CEFTRIAXONE SODIUM 250 MG IJ SOLR
250.0000 mg | Freq: Once | INTRAMUSCULAR | Status: AC
  Administered 2017-11-22: 250 mg via INTRAMUSCULAR
  Filled 2017-11-22: qty 250

## 2017-11-22 MED ORDER — SULFAMETHOXAZOLE-TRIMETHOPRIM 800-160 MG PO TABS: 1.0000 | ORAL_TABLET | Freq: Two times a day (BID) | ORAL | 0 refills | Status: DC

## 2017-11-22 NOTE — MAU Note (Signed)
PT SAYS  [facility]-  STARTED  ABOUT 2 WEEKS  BEFORE.   SHE LEFT  [facility] HAD TO GO TO WORK-  [employer] IN [place].    SINCE Tuesday  - ABD PAIN IS WORSE-  NO MEDS  FOR PAIN.     LAST SEX-  Sunday  .   NO BIRTH CONTROL.      HAS VAG D/C - LAST Friday - HAS ODOR- AND YELLOW.

## 2017-11-22 NOTE — Discharge Instructions (Signed)
In late 2019, the [facility] will be moving to the [place] campus. At that time, the MAU (Maternity Admissions Unit), where you are being seen today, will no longer take care of non-pregnant patients. We strongly encourage you to find a doctor's office before that time, so that you can be seen with any GYN concerns, like vaginal discharge, urinary tract infection, etc.. in a timely manner. ° °In order to make an office visit more convenient, the [facility] will be offering evening hours with same-day appointments, walk-in appointments and scheduled appointments available during this time. ° °[facility] Hours: °Monday - 8am - 7:30 pm with walk-in between 4pm- 7:30 pm °Tuesday - 8 am - 5 pm (starting 09/24/17 we will be open late and accepting walk-ins from 4pm - 7:30pm) °Wednesday - 8 am - 5 pm (starting 12/25/17 we will be open late and accepting walk-ins from 4pm - 7:30pm) °Thursday 8 am - 5 pm (starting 03/27/18 we will be open late and accepting walk-ins from 4pm - 7:30pm) °Friday 8 am - 5 pm ° °For an appointment please call the [facility] at [phone] ° °For urgent needs,  Urgent Care is also available for management of urgent GYN complaints such as vaginal discharge or urinary tract infections. ° ° ° ° ° °

## 2017-11-22 NOTE — MAU Provider Note (Signed)
Chief Complaint: Abdominal Pain   First Provider Initiated Contact with Patient 11/22/17 0522      SUBJECTIVE HPI: Rhonda Evans is a 22 y.o. G1P1001 not currently pregnant who presents to maternity admissions reporting abdominal pain and vaginal discharge. She reports abdominal pain has been occurring for 2 weeks now. She describes the abdominal pain as sharp pain and pressure in her lower pubic area. She rates pain 7/10- has not taken any medication for abdominal pain. She denies any associated symptoms with the abdominal pain and denies radiation to back or upper abdominal region. She reports vaginal discharge started this past Friday. Describes the discharge as yellow discharge with a odor. She denies vaginal itching or burning- has hx of BV, chlamydia and gonorrhea -has taken Flagyl for BV in the past. She reports recent IC this past Sunday after symptoms started occurring. She denies vaginal bleeding, h/a, dizziness, n/v, or chills. Patient reports not taking her temperature at home- fever is noted on arrival to MAU.    Past Medical History:  Diagnosis Date  . Medical history non-contributory    Past Surgical History:  Procedure Laterality Date  . NO PAST SURGERIES     Social History   Socioeconomic History  . Marital status: Single    Spouse name: Not on file  . Number of children: Not on file  . Years of education: Not on file  . Highest education level: Not on file  Occupational History  . Not on file  Social Needs  . Financial resource strain: Not on file  . Food insecurity:    Worry: Not on file    Inability: Not on file  . Transportation needs:    Medical: Not on file    Non-medical: Not on file  Tobacco Use  . Smoking status: Never Smoker  . Smokeless tobacco: Never Used  Substance and Sexual Activity  . Alcohol use: No  . Drug use: No  . Sexual activity: Yes    Birth control/protection: None  Lifestyle  . Physical activity:    Days per week: Not on file   Minutes per session: Not on file  . Stress: Not on file  Relationships  . Social connections:    Talks on phone: Not on file    Gets together: Not on file    Attends religious service: Not on file    Active member of club or organization: Not on file    Attends meetings of clubs or organizations: Not on file    Relationship status: Not on file  . Intimate partner violence:    Fear of current or ex partner: Not on file    Emotionally abused: Not on file    Physically abused: Not on file    Forced sexual activity: Not on file  Other Topics Concern  . Not on file  Social History Narrative  . Not on file   No current facility-administered medications on file prior to encounter.    Current Outpatient Medications on File Prior to Encounter  Medication Sig Dispense Refill  . azithromycin (ZITHROMAX) 250 MG tablet Take all 4 tabs at once with food. 4 tablet 0  . ibuprofen (ADVIL,MOTRIN) 600 MG tablet Take 1 tablet (600 mg total) by mouth every 6 (six) hours as needed. 30 tablet 0  . metroNIDAZOLE (FLAGYL) 500 MG tablet Take 1 tablet (500 mg total) by mouth 2 (two) times daily. 14 tablet 0  . norethindrone-ethinyl estradiol (NECON,BREVICON,MODICON) 0.5-35 MG-MCG tablet Take 1 tablet by mouth  daily. (Patient not taking: Reported on 05/27/2017) 2 Package 0   Allergies  Allergen Reactions  . Amoxicillin Anaphylaxis and Rash    Has patient had a PCN reaction causing immediate rash, facial/tongue/throat swelling, SOB or lightheadedness with hypotension: Yes Has patient had a PCN reaction causing severe rash involving mucus membranes or skin necrosis: No Has patient had a PCN reaction that required hospitalization Yes Has patient had a PCN reaction occurring within the last 10 years: No If all of the above answers are "NO", then may proceed with Cephalosporin use.     ROS:  Review of Systems  Constitutional: Positive for fever. Negative for chills, diaphoresis and fatigue.  Respiratory:  Negative.   Cardiovascular: Negative.   Gastrointestinal: Positive for abdominal pain. Negative for constipation, diarrhea, nausea and vomiting.  Genitourinary: Positive for vaginal discharge. Negative for difficulty urinating, dysuria, frequency, hematuria, urgency and vaginal bleeding.  Musculoskeletal: Negative.    I have reviewed patient's Past Medical Hx, Surgical Hx, Family Hx, Social Hx, medications and allergies.   Physical Exam   Patient Vitals for the past 24 hrs:  BP Temp Temp src Pulse Resp SpO2 Height Weight  11/22/17 0735 116/64 99.4 F (37.4 C) Oral (!) 116 20 97 % - -  11/22/17 0649 - (!) 102.3 F (39.1 C) Axillary - - - - -  11/22/17 0452 101/68 (!) 102.1 F (38.9 C) Oral (!) 129 20 - 5\' 5"  (1.651 m) 159 lb 12 oz (72.5 kg)   Constitutional: Well-developed, well-nourished female in no acute distress.  Cardiovascular: normal rate Respiratory: normal effort GI: Abd soft, tender in suprapubic region. Negative for rebound tenderness or guarding. Pos BS x 4 Neurologic: Alert and oriented x 4.  GU: Neg CVAT.  PELVIC EXAM: blind swabs obtained, yellow discharge noted on in vaginal introitus with a mild vaginal odor.  Bimanual exam: Cervix 0/long/high, firm, anterior, positive CMT, uterus tender, nonenlarged, adnexa without tenderness, enlargement, or mass   LAB RESULTS Results for orders placed or performed during the hospital encounter of 11/22/17 (from the past 24 hour(s))  Wet prep, genital     Status: Abnormal   Collection Time: 11/22/17  4:55 AM  Result Value Ref Range   Yeast Wet Prep HPF POC NONE SEEN NONE SEEN   Trich, Wet Prep NONE SEEN NONE SEEN   Clue Cells Wet Prep HPF POC PRESENT (A) NONE SEEN   WBC, Wet Prep HPF POC MANY (A) NONE SEEN   Sperm NONE SEEN   Urinalysis, Routine w reflex microscopic     Status: Abnormal   Collection Time: 11/22/17  5:05 AM  Result Value Ref Range   Color, Urine AMBER (A) YELLOW   APPearance HAZY (A) CLEAR   Specific  Gravity, Urine 1.028 1.005 - 1.030   pH 5.0 5.0 - 8.0   Glucose, UA NEGATIVE NEGATIVE mg/dL   Hgb urine dipstick SMALL (A) NEGATIVE   Bilirubin Urine NEGATIVE NEGATIVE   Ketones, ur NEGATIVE NEGATIVE mg/dL   Protein, ur NEGATIVE NEGATIVE mg/dL   Nitrite POSITIVE (A) NEGATIVE   Leukocytes, UA MODERATE (A) NEGATIVE   RBC / HPF 6-10 0 - 5 RBC/hpf   WBC, UA 21-50 0 - 5 WBC/hpf   Bacteria, UA MANY (A) NONE SEEN   Squamous Epithelial / LPF 0-5 0 - 5   Mucus PRESENT   Pregnancy, urine POC     Status: None   Collection Time: 11/22/17  5:17 AM  Result Value Ref Range   Preg Test, Ur  NEGATIVE NEGATIVE    MAU Management/MDM: Orders Placed This Encounter  Procedures  . Wet prep, genital  . Urine Culture  . Urinalysis, Routine w reflex microscopic  . Pregnancy, urine POC   Wet prep- positive for clue cells, will treat for BV based on clinical symptoms of discharge with odor GC/C- pending  UA- positive for nitrates  Urine culture pending   Meds ordered this encounter  Medications  . ketorolac (TORADOL) injection 60 mg  . acetaminophen (TYLENOL) tablet 1,000 mg  . Secnidazole (SOLOSEC) 2 g PACK    Sig: Take 2 g by mouth once for 1 dose.    Dispense:  1 each    Refill:  0    Order Specific Question:   Supervising Provider    Answer:   Conan Bowens [[id number]]  . sulfamethoxazole-trimethoprim (BACTRIM DS,SEPTRA DS) 800-160 MG tablet    Sig: Take 1 tablet by mouth 2 (two) times daily.    Dispense:  6 tablet    Refill:  0    Order Specific Question:   Supervising Provider    Answer:   Conan Bowens [[id number]]  . cefTRIAXone (ROCEPHIN) injection 250 mg    Order Specific Question:   Antibiotic Indication:    Answer:   STD  . diphenhydrAMINE (BENADRYL) capsule 25 mg  . doxycycline (VIBRA-TABS) 100 MG tablet    Sig: Take 1 tablet (100 mg total) by mouth 2 (two) times daily.    Dispense:  28 tablet    Refill:  0    Order Specific Question:   Supervising Provider    Answer:   Conan Bowens [[id number]]   Treatments in MAU included 60mg  Toradol for abdominal pain and 1,000mg  Tylenol for fever. Patient was dx with chlamydia and gonorrhea on 12/3 but only received azithromycin for treatment due to childhood allergy to amoxicillin. Will treat for gonorrhea and PID based on clinical symptoms of CMT, uterine tenderness, fever and not adequately treated. Discussed with pharmacy alternative medication for rocephin- pharmacy states rocephin is okay to give with benadryl and extra monitoring prior to discharge home. Rocephin 250mg  injection given to patient prior to discharge.     Patient did not have a reaction to Rocephin after medication was given or prior to discharge- monitored for 40 minutes after medication given.    Pt discharged. F/u in 3 weeks in office after treatment for PID. Discussed reasons to return to MAU. Rx for Doxycycline sent to pharmacy on file for PID treatment. Rx for Bactrim for UTI and Solosec for BV sent to pharmacy of choice.   ASSESSMENT 1. PID (acute pelvic inflammatory disease)   2. Acute cystitis without hematuria   3. BV (bacterial vaginosis)     PLAN Discharge home. Pt stable prior to discharge.  Rx for Bactrim and Solosec sent to pharmacy of choice  Discussed reasons to return to MAU  F/u in 3 weeks in office- make appointment to be seen   Educated on the need to increased amount of water in diet to help prevent UTIs  Will call patient if GC/C is positive or if urine culture shows Bactrim is resistant.  Discussed with patient the importance of abstaining intercourse until PID is fully treated- patient verbalizes understanding.    Follow-up Information    [facility] Follow up.   Specialty:  Urgent Care Why:  return to urgent care or [facility] for worsening symptoms  Contact information: [address]  [place] [address] [phone]          Allergies as of 11/22/2017      Reactions    Amoxicillin Anaphylaxis, Rash   Has patient had a PCN reaction causing immediate rash, facial/tongue/throat swelling, SOB or lightheadedness with hypotension: Yes Has patient had a PCN reaction causing severe rash involving mucus membranes or skin necrosis: No Has patient had a PCN reaction that required hospitalization Yes Has patient had a PCN reaction occurring within the last 10 years: No If all of the above answers are "NO", then may proceed with Cephalosporin use.      Medication List    STOP taking these medications   azithromycin 250 MG tablet Commonly known as:  ZITHROMAX   metroNIDAZOLE 500 MG tablet Commonly known as:  FLAGYL     TAKE these medications   doxycycline 100 MG tablet Commonly known as:  VIBRA-TABS Take 1 tablet (100 mg total) by mouth 2 (two) times daily.   ibuprofen 600 MG tablet Commonly known as:  ADVIL,MOTRIN Take 1 tablet (600 mg total) by mouth every 6 (six) hours as needed.   norethindrone-ethinyl estradiol 0.5-35 MG-MCG tablet Commonly known as:  NECON,BREVICON,MODICON Take 1 tablet by mouth daily.   Secnidazole 2 g Pack Commonly known as:  SOLOSEC Take 2 g by mouth once for 1 dose.   sulfamethoxazole-trimethoprim 800-160 MG tablet Commonly known as:  BACTRIM DS,SEPTRA DS Take 1 tablet by mouth 2 (two) times daily.       Steward Drone  Certified Nurse-Midwife 11/22/2017  7:57 AM

## 2017-11-24 LAB — URINE CULTURE: Culture: >=100000 — AB

## 2017-11-25 LAB — GC/CHLAMYDIA PROBE AMP (~~LOC~~) NOT AT ARMC
CHLAMYDIA, DNA PROBE: POSITIVE — AB
NEISSERIA GONORRHEA: POSITIVE — AB

## 2018-04-04 ENCOUNTER — Encounter: Payer: Self-pay | Admitting: Emergency Medicine

## 2018-04-04 ENCOUNTER — Emergency Department (HOSPITAL_COMMUNITY)
Admission: EM | Admit: 2018-04-04 | Discharge: 2018-04-04 | Disposition: A | Discharge status: Home / Self Care | Payer: Medicaid Other | Attending: Emergency Medicine | Admitting: Emergency Medicine

## 2018-04-04 DIAGNOSIS — L731 Pseudofolliculitis barbae: Secondary | ICD-10-CM | POA: Diagnosis not present

## 2018-04-04 DIAGNOSIS — Z79899 Other long term (current) drug therapy: Secondary | ICD-10-CM | POA: Diagnosis not present

## 2018-04-04 DIAGNOSIS — R600 Localized edema: Secondary | ICD-10-CM | POA: Diagnosis present

## 2018-04-04 LAB — I-STAT BETA HCG BLOOD, ED (MC, WL, AP ONLY): I-stat hCG, quantitative: <5 m[IU]/mL (ref <5)

## 2018-04-04 NOTE — ED Provider Notes (Signed)
[facility] EMERGENCY DEPARTMENT Provider Note   CSN: [id number] Arrival date & time: 04/04/18  1510     History   Chief Complaint Chief Complaint  Patient presents with  . Abscess    HPI Rhonda Evans is a 22 y.o. female presenting for ingrown hair to her left buttock that she first noticed yesterday.  Patient describes her pain as a mild sharp pain that is constant and worsened with touching the area and with sitting down.  Patient denies history of similar symptoms in the past.  Patient has not tried anything for her pain.  Patient states that she is also concerned that she may be pregnant today.  States her last menstrual period was in August.  Patient denies vaginal bleeding, vaginal discharge, abdominal pain, pelvic pain or nausea/vomiting.  HPI  Past Medical History:  Diagnosis Date  . Medical history non-contributory     There are no active problems to display for this patient.   Past Surgical History:  Procedure Laterality Date  . NO PAST SURGERIES       OB History    Gravida  1   Para  1   Term  1   Preterm      AB      Living  1     SAB      TAB      Ectopic      Multiple      Live Births               Home Medications    Prior to Admission medications   Medication Sig Start Date End Date Taking? Authorizing Provider  doxycycline (VIBRA-TABS) 100 MG tablet Take 1 tablet (100 mg total) by mouth 2 (two) times daily. 11/22/17   Sharyon Cable, CNM  ibuprofen (ADVIL,MOTRIN) 600 MG tablet Take 1 tablet (600 mg total) by mouth every 6 (six) hours as needed. 07/20/17   Roxy Horseman, PA-C  norethindrone-ethinyl estradiol (NECON,BREVICON,MODICON) 0.5-35 MG-MCG tablet Take 1 tablet by mouth daily. Patient not taking: Reported on 05/27/2017 12/15/16   Currie Paris, NP  sulfamethoxazole-trimethoprim (BACTRIM DS,SEPTRA DS) 800-160 MG tablet Take 1 tablet by mouth 2 (two) times daily. 11/22/17   Sharyon Cable, CNM      Family History Family History  Problem Relation Age of Onset  . Diabetes Maternal Grandfather   . Hypertension Maternal Grandfather   . Cancer Maternal Grandfather   . Cancer Paternal Grandfather   . Hypertension Maternal Aunt   . Diabetes Maternal Grandmother        great  . Hypertension Maternal Grandmother        great  . Hearing loss Neg Hx     Social History Social History   Tobacco Use  . Smoking status: Never Smoker  . Smokeless tobacco: Never Used  Substance Use Topics  . Alcohol use: No  . Drug use: No     Allergies   Amoxicillin   Review of Systems Review of Systems  Constitutional: Negative.  Negative for chills and fever.  Gastrointestinal: Negative.  Negative for abdominal pain, nausea and vomiting.  Genitourinary: Negative.  Negative for dysuria, flank pain, hematuria, pelvic pain, vaginal bleeding and vaginal discharge.  Skin:       Ingrown hair to left buttock   Physical Exam Updated Vital Signs BP 124/82 (BP Location: Right Arm)   Pulse 86   Temp 99 F (37.2 C) (Oral)   Resp 16   LMP  02/08/2018 (Exact Date)   SpO2 100%   Physical Exam  Constitutional: She is oriented to person, place, and time. She appears well-developed and well-nourished. No distress.  HENT:  Head: Normocephalic and atraumatic.  Right Ear: External ear normal.  Left Ear: External ear normal.  Nose: Nose normal.  Eyes: Pupils are equal, round, and reactive to light. EOM are normal.  Neck: Trachea normal and normal range of motion. No tracheal deviation present.  Pulmonary/Chest: Effort normal. No respiratory distress.  Abdominal: Soft. There is no tenderness. There is no rebound and no guarding.  Musculoskeletal: Normal range of motion.  Neurological: She is alert and oriented to person, place, and time.  Skin: Skin is warm and dry.     Patient with single ingrown hair to left buttock.  Small amount of pus noted in follicles surrounding the area.  Minimal  surrounding erythema.  No fluctuance or induration present.  No streaking present.  Psychiatric: She has a normal mood and affect. Her behavior is normal.    ED Treatments / Results  Labs (all labs ordered are listed, but only abnormal results are displayed) Labs Reviewed  I-STAT BETA HCG BLOOD, ED ([facility], [facility], [facility] ONLY)    EKG None  Radiology No results found.  Procedures Procedures (including critical care time)  Ingrown hair removal----------------- Single ingrown hair removed with tweezers.  Area was cleaned with Betadine prior to procedure.  Small amount of pus expressed with removal of hair.  Area covered with bacitracin and sterile gauze.  Procedure chaperoned by Marcelino Duster EMT. ---------------------------------  Medications Ordered in ED Medications - No data to display   Initial Impression / Assessment and Plan / ED Course  I have reviewed the triage vital signs and the nursing notes.  Pertinent labs & imaging results that were available during my care of the patient were reviewed by me and considered in my medical decision making (see chart for details).  Clinical Course as of Apr 04 1554  Fri Apr 04, 2018  1544 Examination chaperoned by Marcelino Duster EMT.   [BM]    Clinical Course User Index [BM] Bill Salinas, PA-C   Pregnancy test negative  Patient with 1 day history of pain and swelling to left buttock.  Single ingrown hair noted to area.  No surrounding infection or abscess present.  Ingrown hair was removed as above.  Area cleaned and prepped with Betadine and small amount of pus expressed.  Area covered with bacitracin and sterile gauze.  Patient encouraged to use warm compresses and sitz bath's to help encourage healing of the area.  Patient informed to follow-up with PCP as soon as possible for reevaluation.  Patient encouraged to come back to the emergency department if symptoms return.  Patient states improvement in pain following ingrown hair  removal.  Patient is afebrile, not tachycardic, not tachypnea, not hypotensive, well-appearing and in no acute distress.  At this time there does not appear to be any evidence of an acute emergency medical condition and the patient appears stable for discharge with appropriate outpatient follow up. Diagnosis was discussed with patient who verbalizes understanding of care plan and is agreeable to discharge. I have discussed return precautions with patient who verbalizes understanding of return precautions. Patient strongly encouraged to follow-up with their PCP. All questions answered.   Note: Portions of this report may have been transcribed using voice recognition software. Every effort was made to ensure accuracy; however, inadvertent computerized transcription errors may still be present.  Final Clinical  Impressions(s) / ED Diagnoses   Final diagnoses:  Ingrown hair    ED Discharge Orders    None       Elizabeth Palau 04/04/18 1604    Donnetta Hutching, MD 04/05/18 [phone]

## 2018-04-04 NOTE — ED Triage Notes (Signed)
Pt presents for evaluation of abscess to L buttocks. Denies fever, chills, some redness to skin reported. Pt reports she has missed period, LMP 8/17.

## 2018-04-04 NOTE — Discharge Instructions (Addendum)
Please return to the Emergency Department for any new or worsening symptoms or if your symptoms do not improve. Please be sure to follow up with your Primary Care Physician as soon as possible regarding your visit today. If you do not have a Primary Doctor please use the resources below to establish one. Please keep the area covered with a clean dry bandage and use antibiotic ointment for the next few days. Please use sitz bath's and warm soaks 3-4 times daily to help keep the area draining. Now that the area is open it will hopefully heal well on its own with the treatment as above.  However if the swelling and pain returns you will need to be reevaluated.  Even if your symptoms do not return please follow-up with your primary care provider for reevaluation as soon as possible.  Contact a health care provider if: Your cyst or abscess returns. You have a fever. You have more redness, swelling, or pain around your incision. You have more fluid or blood coming from your incision. Your incision feels warm to the touch. You have pus or a bad smell coming from your incision. Get help right away if: You have severe pain or bleeding. You cannot eat or drink without vomiting. You have decreased urine output. You become short of breath. You have chest pain. You cough up blood. The area where the incision and drainage occurred becomes numb or it tingles.  Do not take your medicine if  develop an itchy rash, swelling in your mouth or lips, or difficulty breathing.   RESOURCE GUIDE  Chronic Pain Problems: Contact [facility]  [phone] Patients need to be referred by their primary care doctor.  Insufficient Money for Medicine: Contact United Way:  call "211" or [facility] (424)146-8586.  No Primary Care Doctor: Call [facility]  [phone] - can help you locate a primary care doctor that  accepts your insurance, provides certain services, etc. Physician Referral  Service- [phone]  Agencies that provide inexpensive medical care: [facility]  [phone] [facility]  [phone] [facility]  [phone] [facility]  [phone] [facility]  [phone] [facility]  [phone]  Medicaid-accepting [facility] Providers: [facility]- [address]  [phone], Mon-Fri 9am-7pm, Sat 9am-1pm [facility]- [address] [place], Suite [place]  [phone] [facility]- [address] [place]  [phone] [facility]- [address]  [phone] Renaye Rakers- [address] [place], [address], [phone]  Only accepts [place] patients after they have their name  applied to their card  Self Pay (no insurance) in [facility]: Sickle Cell Patients: Dr Willey Blade, [facility] Internal Medicine  [address] [place] [address] [place], [phone] [facility]- [address] [place]  [phone]       [facility] [place]- [address]       -     [facility]- see information above (Speak to [employer] if you do not have insurance)       -  [facility]- [address] [place], [phone]       -  [facility]- [address],  [phone]       -  [facility]- [address] [phone]       -  Dr Julio Sicks-  [address], [place], [phone]       -  [facility]- [address] [phone]       -  [facility]- [address], [phone], also [address], [phone]       -    [facility]- [address] [place], [phone], 1st & 3rd Saturday   every month, 10am-1pm  1) Find a Doctor and Pay Out of Pocket Although you won't have to find out who is covered by your insurance plan, it is a good idea to ask around and get recommendations. You will then need to call the office and see if the  doctor you have chosen will accept you as a new patient and what types of options they offer for patients who are self-pay. Some doctors offer discounts or will set up payment plans for their patients who do not have insurance, but you will need to ask so you aren't surprised when you get to your appointment.  2) Contact Your Local Health Department Not all health departments have doctors that can see patients for sick visits, but many do, so it is worth a call to see if yours does. If you don't know where your local health department is, you can check in your phone book. The CDC also has a tool to help you locate your state's health department, and many state websites also have listings of all of their local health departments.  3) Find a Walk-in Clinic If your illness is not likely to be very severe or complicated, you may want to try a walk in clinic. These are popping up all over the country in pharmacies, drugstores, and shopping centers. They're usually staffed by nurse practitioners or physician assistants that have been trained to treat common illnesses and complaints. They're usually fairly quick and inexpensive. However, if you have serious medical issues or chronic medical problems, these are probably not your best option  STD Testing [facility] of [facility] [place], STD Clinic, [address] [place], phone [phone] or [phone].  Monday - Friday, call for an appointment. [facility] Department of [employer], STD Clinic, [address], [place], phone [phone] or [phone].  Monday - Friday, call for an appointment.  Abuse/Neglect: [facility] Child Abuse Hotline 253-008-2943 [facility] Child Abuse Hotline 916-141-6779 (After Hours)  Emergency Shelter:  [facility] 213-010-3911  Maternity Homes: Room at the [place] [facility] (806)538-3157 [facility] [phone]  MRSA  Hotline #:   (718) 359-0177  [facility] Resources  [facility]  [employer] [facility] Dept. [address]                 [address]         [address] [place] [address]  [place]                                               Cristobal Goldmann Phone:  [phone]  Phone:  [phone]                   Phone:  [phone]  [facility], [phone] [place] [facility] [place]- [phone]       -     [facility] in [place], [address]                                  [phone], [facility] Child Abuse Hotline 910-172-3284 or (804)756-5592 (After Hours)   Behavioral Health Services  Substance Abuse Resources: [facility]  (601) 032-3220 [facility] 418-257-5897 [facility] [place] [phone] Floydene Flock [phone] Residential & Outpatient Substance Abuse Program  8256689438  Psychological Services: [facility]  [phone] [facility] Services  [phone] [facility], [phone] [place]. [address], [place], ACCESS LINE: 646-447-0775 or (678)526-2836, [employer].za  Dental Assistance  If unable to pay or uninsured, contact:  [facility] or [facility]. to become qualified for the adult dental clinic.  Patients with Medicaid: [facility] [phone] [address], [phone] [address] [phone]  If unable to pay, or uninsured, contact [facility] [phone]) or [facility] Department [phone] in [place], [phone] in [facility]) to become qualified for the adult dental clinic   Other Low-Cost Community Dental Services: [facility]- [address] [place], [place], [place], [address], [phone], [phone], 2nd and 4th Thursday of the month at 6:30am.  10  clients each day by appointment, can sometimes see walk-in patients if someone does not show for an appointment. [facility]- [address] [place], [place], [address], [phone] [facility] [address] [place], [place], [address], [phone] [facility] Health Department- [phone] [facility] Health Department- [phone] [facility] [phone]

## 2018-04-09 ENCOUNTER — Ambulatory Visit (HOSPITAL_COMMUNITY): Payer: Self-pay | Admitting: Licensed Clinical Social Worker

## 2018-04-14 ENCOUNTER — Ambulatory Visit (INDEPENDENT_AMBULATORY_CARE_PROVIDER_SITE_OTHER): Payer: Medicaid Other | Admitting: Licensed Clinical Social Worker

## 2018-04-14 DIAGNOSIS — F331 Major depressive disorder, recurrent, moderate: Secondary | ICD-10-CM | POA: Diagnosis not present

## 2018-04-16 ENCOUNTER — Other Ambulatory Visit: Payer: Self-pay

## 2018-04-16 ENCOUNTER — Emergency Department (HOSPITAL_COMMUNITY): Payer: Medicaid Other

## 2018-04-16 ENCOUNTER — Emergency Department (HOSPITAL_COMMUNITY)
Admission: EM | Admit: 2018-04-16 | Discharge: 2018-04-16 | Disposition: A | Discharge status: Home / Self Care | Payer: Medicaid Other | Attending: Emergency Medicine | Admitting: Emergency Medicine

## 2018-04-16 ENCOUNTER — Encounter (HOSPITAL_COMMUNITY): Payer: Self-pay

## 2018-04-16 DIAGNOSIS — Z111 Encounter for screening for respiratory tuberculosis: Secondary | ICD-10-CM

## 2018-04-16 DIAGNOSIS — Z0289 Encounter for other administrative examinations: Secondary | ICD-10-CM | POA: Diagnosis not present

## 2018-04-16 DIAGNOSIS — Z79899 Other long term (current) drug therapy: Secondary | ICD-10-CM | POA: Diagnosis not present

## 2018-04-16 NOTE — ED Triage Notes (Signed)
Pt here for chest XR that she needs for work, pt unable to receive TB skin test d/t allergy.

## 2018-04-16 NOTE — Discharge Instructions (Addendum)
Return here as needed. Your chest x-ray was negative and shows no signs of TB

## 2018-04-16 NOTE — ED Notes (Signed)
Pt requesting 2 copies of her radiology reports of her chest x-ray. copies provided per pt request

## 2018-04-16 NOTE — ED Notes (Signed)
Pt reports needing a chest xray in order to attend orientation for a job tomorrow. Allergic to TB test; denies any complaints

## 2018-04-16 NOTE — ED Provider Notes (Signed)
[facility] EMERGENCY DEPARTMENT Provider Note   CSN: [id number] Arrival date & time: 04/16/18  1829     History   Chief Complaint Chief Complaint  Patient presents with  . Follow-up    HPI Rhonda Evans is a 22 y.o. female.  HPI Patient presents to the emergency department with a request for a chest x-ray to rule out TB.  The patient states she cannot have a TB skin test due to an allergy.  She has no symptoms but need this for her work for tomorrow. Past Medical History:  Diagnosis Date  . Medical history non-contributory     There are no active problems to display for this patient.   Past Surgical History:  Procedure Laterality Date  . NO PAST SURGERIES       OB History    Gravida  1   Para  1   Term  1   Preterm      AB      Living  1     SAB      TAB      Ectopic      Multiple      Live Births               Home Medications    Prior to Admission medications   Medication Sig Start Date End Date Taking? Authorizing Provider  doxycycline (VIBRA-TABS) 100 MG tablet Take 1 tablet (100 mg total) by mouth 2 (two) times daily. 11/22/17   Sharyon Cable, CNM  ibuprofen (ADVIL,MOTRIN) 600 MG tablet Take 1 tablet (600 mg total) by mouth every 6 (six) hours as needed. 07/20/17   Roxy Horseman, PA-C  norethindrone-ethinyl estradiol (NECON,BREVICON,MODICON) 0.5-35 MG-MCG tablet Take 1 tablet by mouth daily. Patient not taking: Reported on 05/27/2017 12/15/16   Currie Paris, NP  sulfamethoxazole-trimethoprim (BACTRIM DS,SEPTRA DS) 800-160 MG tablet Take 1 tablet by mouth 2 (two) times daily. 11/22/17   Sharyon Cable, CNM    Family History Family History  Problem Relation Age of Onset  . Diabetes Maternal Grandfather   . Hypertension Maternal Grandfather   . Cancer Maternal Grandfather   . Cancer Paternal Grandfather   . Hypertension Maternal Aunt   . Diabetes Maternal Grandmother        great  . Hypertension  Maternal Grandmother        great  . Hearing loss Neg Hx     Social History Social History   Tobacco Use  . Smoking status: Never Smoker  . Smokeless tobacco: Never Used  Substance Use Topics  . Alcohol use: No  . Drug use: No     Allergies   Amoxicillin   Review of Systems Review of Systems All other systems negative except as documented in the HPI. All pertinent positives and negatives as reviewed in the HPI.  Physical Exam Updated Vital Signs BP 126/85 (BP Location: Right Arm)   Pulse 94   Temp 98.6 F (37 C) (Oral)   Resp 16   Ht 5\' 5"  (1.651 m)   Wt 72.6 kg   LMP 04/16/2018   SpO2 94%   BMI 26.63 kg/m   Physical Exam  Constitutional: She is oriented to person, place, and time. She appears well-developed and well-nourished. No distress.  HENT:  Head: Normocephalic and atraumatic.  Eyes: Pupils are equal, round, and reactive to light.  Pulmonary/Chest: Effort normal and breath sounds normal. No stridor. No respiratory distress. She has no wheezes. She has  no rales.  Neurological: She is alert and oriented to person, place, and time.  Skin: Skin is warm and dry.  Psychiatric: She has a normal mood and affect.  Nursing note and vitals reviewed.    ED Treatments / Results  Labs (all labs ordered are listed, but only abnormal results are displayed) Labs Reviewed - No data to display  EKG None  Radiology Dg Chest 2 View  Result Date: 04/16/2018 CLINICAL DATA:  Allergy to TB skin test EXAM: CHEST - 2 VIEW COMPARISON:  None. FINDINGS: The heart size and mediastinal contours are within normal limits. Both lungs are clear. The visualized skeletal structures are unremarkable. IMPRESSION: No active cardiopulmonary disease. Electronically Signed   By: Jasmine Pang M.D.   On: 04/16/2018 21:37    Procedures Procedures (including critical care time)  Medications Ordered in ED Medications - No data to display   Initial Impression / Assessment and Plan /  ED Course  I have reviewed the triage vital signs and the nursing notes.  Pertinent labs & imaging results that were available during my care of the patient were reviewed by me and considered in my medical decision making (see chart for details).    Patient has a negative chest x-ray.  Patient has no other requests.  Final Clinical Impressions(s) / ED Diagnoses   Final diagnoses:  None    ED Discharge Orders    None       Charlestine Night, PA-C 04/17/18 0003    Mesner, Barbara Cower, MD 04/19/18 [phone]
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
## 2018-05-01 ENCOUNTER — Ambulatory Visit (HOSPITAL_COMMUNITY): Payer: Medicaid Other | Admitting: Licensed Clinical Social Worker

## 2018-05-15 ENCOUNTER — Ambulatory Visit (HOSPITAL_COMMUNITY): Payer: Medicaid Other | Admitting: Licensed Clinical Social Worker

## 2018-06-04 ENCOUNTER — Ambulatory Visit (INDEPENDENT_AMBULATORY_CARE_PROVIDER_SITE_OTHER): Payer: Medicaid Other | Admitting: Licensed Clinical Social Worker

## 2018-06-04 DIAGNOSIS — F331 Major depressive disorder, recurrent, moderate: Secondary | ICD-10-CM | POA: Diagnosis not present
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
## 2018-07-01 ENCOUNTER — Ambulatory Visit (HOSPITAL_COMMUNITY): Payer: Medicaid Other | Admitting: Licensed Clinical Social Worker

## 2018-07-16 ENCOUNTER — Ambulatory Visit (HOSPITAL_COMMUNITY): Payer: Medicaid Other | Admitting: Licensed Clinical Social Worker

## 2018-07-16 ENCOUNTER — Ambulatory Visit (INDEPENDENT_AMBULATORY_CARE_PROVIDER_SITE_OTHER): Payer: Medicaid Other | Admitting: Licensed Clinical Social Worker

## 2018-07-16 DIAGNOSIS — F331 Major depressive disorder, recurrent, moderate: Secondary | ICD-10-CM | POA: Diagnosis not present

## 2018-07-17 NOTE — Progress Notes (Signed)
   THERAPIST PROGRESS NOTE  Session Time: 4:10pm-4:55pm  Participation Level: Active  Behavioral Response: CasualAlertAnxious, Depressed and Irritable  Type of Therapy: Individual Therapy  Treatment Goals addressed: Coping  Interventions: CBT and Supportive  Summary: Rhonda Evans is a 22 y.o. female who presents with symptoms of depression including possible hallucinations, anger outburts, and decreased motivation and mood. Client presents with flat affect, denies tearfulness most days. Client reports she is still easily frustrated and is isolating herself to avoid 'lashing out' at others. Client is concerned she is not being a good enough mother due to this. [name]' father has joined the [employer] and client is concerned with the lack of support. Client currently lives with daughters biological grandfather who is supportive. Client endorses concern about being the only adult in the home with her daughter at this time. Client denies this is due to thoughts of wanting to hurt herself or others, but to ensure her daughter is taken care of even when she is 'having a bad day.' Client reports she continues to have frustrations at work and is worried about losing her job due to reacting to client behaviors such as yelling at her. Client reports having lots of trouble with memory and sleeping as well. Client reports racing thoughts while trying to fall asleep, denies ruminations or obsessive thoughts. Client was receptive to 'brain dump' when unable to fall asleep at night, as well as 5-senses grounding activity to help manage overwhelming moods.   Suicidal/Homicidal: Nowithout intent/plan  Therapist Response: Clinician met with client and daughter, assessing for SI/HI/psychosis and overall level of functioning. Clinician inquired about frequency of reported seeing shadows which client reports has increased. Clinician validated client feelings related to frustration of symptoms not improving  and feelings of lack of support from family. Clinician provided client mindfulness grounding activity and practiced in session. Clinician encouraged client to practice skill daily, no matter her mood. Clinician assessed for safety of client and child. Clinician and client scheduled appointment for medication management for client.  Plan: Return again in 2 weeks.  Diagnosis: Axis I: Major Depression, Recurrent severe     Olegario Messier, LCSW 07/16/2018

## 2018-07-22 ENCOUNTER — Emergency Department (HOSPITAL_COMMUNITY)
Admission: EM | Admit: 2018-07-22 | Discharge: 2018-07-22 | Disposition: A | Discharge status: Home / Self Care | Payer: Medicaid Other | Attending: Emergency Medicine | Admitting: Emergency Medicine

## 2018-07-22 DIAGNOSIS — R112 Nausea with vomiting, unspecified: Secondary | ICD-10-CM | POA: Insufficient documentation

## 2018-07-22 DIAGNOSIS — R102 Pelvic and perineal pain: Secondary | ICD-10-CM | POA: Insufficient documentation

## 2018-07-22 DIAGNOSIS — N926 Irregular menstruation, unspecified: Secondary | ICD-10-CM | POA: Insufficient documentation

## 2018-07-22 DIAGNOSIS — R350 Frequency of micturition: Secondary | ICD-10-CM | POA: Diagnosis not present

## 2018-07-22 DIAGNOSIS — N898 Other specified noninflammatory disorders of vagina: Secondary | ICD-10-CM

## 2018-07-22 LAB — URINALYSIS, ROUTINE W REFLEX MICROSCOPIC
Bilirubin Urine: NEGATIVE
Glucose, UA: NEGATIVE mg/dL
Ketones, ur: NEGATIVE mg/dL
NITRITE: NEGATIVE
PROTEIN: NEGATIVE mg/dL
Specific Gravity, Urine: 1.019 (ref 1.005–1.030)
pH: 6 (ref 5.0–8.0)

## 2018-07-22 LAB — PREGNANCY, URINE: Preg Test, Ur: NEGATIVE

## 2018-07-22 MED ORDER — FOSFOMYCIN TROMETHAMINE 3 G PO PACK
3.0000 g | PACK | Freq: Once | ORAL | Status: DC
  Filled 2018-07-22: qty 3

## 2018-07-22 NOTE — Discharge Instructions (Addendum)
Today your pregnancy test was negative.  Please take another test in 3 days, if that is negative take one 1 week from now.  I have given you information for the [facility] outpatient clinic.  Please call them to schedule an appointment for a pelvic exam.  As we discussed today I am unable to fully evaluate you as you refused pelvic exam and are having pelvic pain and vaginal discharge.  You may have conditions that may be life threatening or cause disability or loss of fertility.    I have given you a one-time dose of antibiotics here in the emergency room for urinary tract infection.  With the possible pregnancy, and your allergies this appears to be the best option time.   If your symptoms worsen, or you have additional concerns please seek additional medical care and evaluation.

## 2018-07-22 NOTE — ED Provider Notes (Addendum)
[facility]   CSN: [id number] Arrival date & time: 07/22/18  1310     History   Chief Complaint No chief complaint on file.   HPI Rhonda Evans is a 23 y.o. female who presents today for evaluation of urinary frequency and urgency.  She reports that for the past 2 weeks she has been having intermittent however gradually worsening urinary frequency urgency along with foul urinary odors.  She denies any dysuria.  She says that she has had urinary tract infections before and this feels similar.  She reports discomfort in her pelvis and in her lower back.  She denies any fevers.  She has been nauseous for the past 2 days with vomiting 2 days ago and yesterday however none today.  No diarrhea.  She denies any fevers.  She does report vaginal discharge.  She is currently 8 days late on her menstrual cycle, which she says is abnormal for her.  She says she may be pregnant.   HPI  Past Medical History:  Diagnosis Date  . Medical history non-contributory     There are no active problems to display for this patient.   Past Surgical History:  Procedure Laterality Date  . NO PAST SURGERIES       OB History    Gravida  1   Para  1   Term  1   Preterm      AB      Living  1     SAB      TAB      Ectopic      Multiple      Live Births               Home Medications    Prior to Admission medications   Medication Sig Start Date End Date Taking? Authorizing Provider  doxycycline (VIBRA-TABS) 100 MG tablet Take 1 tablet (100 mg total) by mouth 2 (two) times daily. 11/22/17   Sharyon Cableogers, Veronica C, CNM  ibuprofen (ADVIL,MOTRIN) 600 MG tablet Take 1 tablet (600 mg total) by mouth every 6 (six) hours as needed. 07/20/17   Roxy HorsemanBrowning, Robert, PA-C  norethindrone-ethinyl estradiol (NECON,BREVICON,MODICON) 0.5-35 MG-MCG tablet Take 1 tablet by mouth daily. Patient not taking: Reported on 05/27/2017 12/15/16   Currie ParisBurleson, Terri L,  NP  sulfamethoxazole-trimethoprim (BACTRIM DS,SEPTRA DS) 800-160 MG tablet Take 1 tablet by mouth 2 (two) times daily. 11/22/17   Sharyon Cableogers, Veronica C, CNM    Family History Family History  Problem Relation Age of Onset  . Diabetes Maternal Grandfather   . Hypertension Maternal Grandfather   . Cancer Maternal Grandfather   . Cancer Paternal Grandfather   . Hypertension Maternal Aunt   . Diabetes Maternal Grandmother        great  . Hypertension Maternal Grandmother        great  . Hearing loss Neg Hx     Social History Social History   Tobacco Use  . Smoking status: Never Smoker  . Smokeless tobacco: Never Used  Substance Use Topics  . Alcohol use: No  . Drug use: No     Allergies   Amoxicillin   Review of Systems Review of Systems  Constitutional: Negative for chills and fever.  Gastrointestinal: Positive for nausea and vomiting. Negative for abdominal pain, constipation and diarrhea.  Genitourinary: Positive for frequency, pelvic pain, urgency and vaginal discharge. Negative for dysuria and vaginal bleeding.  Musculoskeletal: Positive for back pain. Negative for neck  pain.  Skin: Negative for color change and wound.  Neurological: Negative for weakness and numbness.  All other systems reviewed and are negative.    Physical Exam Updated Vital Signs BP 114/79   Pulse 96   Temp 99.5 F (37.5 C)   Resp 18   SpO2 100%   Physical Exam Vitals signs and nursing note reviewed.  Constitutional:      General: She is not in acute distress.    Appearance: She is not ill-appearing.  HENT:     Head: Normocephalic.  Neck:     Musculoskeletal: Normal range of motion.  Cardiovascular:     Rate and Rhythm: Normal rate.     Pulses: Normal pulses.     Heart sounds: Normal heart sounds.  Pulmonary:     Effort: Pulmonary effort is normal. No respiratory distress.  Abdominal:     General: Abdomen is flat. Bowel sounds are normal. There is no distension.     Palpations:  Abdomen is soft.     Tenderness: There is abdominal tenderness in the right lower quadrant, suprapubic area and left lower quadrant.  Genitourinary:    Comments: Patient refused pelvic exam Skin:    General: Skin is warm and dry.  Neurological:     Mental Status: She is alert.      ED Treatments / Results  Labs (all labs ordered are listed, but only abnormal results are displayed) Labs Reviewed  URINALYSIS, ROUTINE W REFLEX MICROSCOPIC - Abnormal; Notable for the following components:      Result Value   Color, Urine AMBER (*)    APPearance CLOUDY (*)    Hgb urine dipstick SMALL (*)    Leukocytes, UA LARGE (*)    Bacteria, UA MANY (*)    All other components within normal limits  URINE CULTURE  PREGNANCY, URINE    EKG None  Radiology No results found.  Procedures Procedures (including critical care time)  Medications Ordered in ED Medications  fosfomycin (MONUROL) packet 3 g (has no administration in time range)     Initial Impression / Assessment and Plan / ED Course  I have reviewed the triage vital signs and the nursing notes.  Pertinent labs & imaging results that were available during my care of the patient were reviewed by me and considered in my medical decision making (see chart for details).    Patient presents today for evaluation of pelvic pain and urinary frequency and urgency.  She is 8 days late on her menstrual cycle.  Urine pregnancy test here is negative.  She reports pelvic pain, however refused pelvic exam or STD testing despite having discharge.  She is aware that this limits my ability to evaluate her and she may have a infection or other condition that could cause pain, worsening condition, infection, disability that may be severe, loss of fertility, and death and states her understanding of this.  Her urine appears contaminated, however she is having increased frequency or urgency.  Urine culture was sent.  While her pregnancy test here is  negative she is late on her menstrual cycle which is not normal for her, she has severe allergy to amoxicillin.  She has never had Keflex before, and chart review supports that.  Therefore will treat with one-time dose of fosfomycin due to concerns of pregnancy and allergic reaction.    I have given her information for the [facility] outpatient clinic to establish care for pelvic exam.    She reported nausea and  vomiting was worse 2 days ago, only once yesterday and none today.  She is offered further evaluation including blood work evaluation as needed, however she refused this.  She was offered nausea medicine which she refused.  Return precautions were discussed with patient who states their understanding.  At the time of discharge patient denied any unaddressed complaints or concerns.  Patient is agreeable for discharge home.   Final Clinical Impressions(s) / ED Diagnoses   Final diagnoses:  Urinary frequency  Pelvic pain  Vaginal discharge  Menstrual period late    ED Discharge Orders    None           Cristina Gong, PA-C 07/22/18 1709    Doug Sou, MD 07/22/18 [phone]

## 2018-07-22 NOTE — ED Triage Notes (Signed)
Pt here from home with c/o freg urination and possible pregnancy , no bleeding but does have some discharge

## 2018-07-24 LAB — URINE CULTURE

## 2018-07-25 ENCOUNTER — Telehealth: Payer: Self-pay | Admitting: *Deleted

## 2018-07-25 NOTE — Telephone Encounter (Signed)
Post ED Visit - Positive Culture Follow-up: Successful Patient Follow-Up  Culture assessed and recommendations reviewed by:  []  Enzo Bi, Pharm.D. []  Celedonio Miyamoto, Pharm.D., BCPS AQ-ID []  Garvin Fila, Pharm.D., BCPS []  Georgina Pillion, Pharm.D., BCPS []  [place], [address].D., BCPS, AAHIVP []  Estella Husk, Pharm.D., BCPS, AAHIVP [x]  Lysle Pearl, PharmD, BCPS []  Phillips Climes, PharmD, BCPS []  Agapito Games, PharmD, BCPS []  Verlan Friends, PharmD  Positive urine culture  [x]  Patient discharged without antimicrobial prescription and treatment is now indicated []  Organism is resistant to prescribed ED discharge antimicrobial []  Patient with positive blood cultures  Changes discussed with ED provider Aviva Kluver, PA New antibiotic prescription Macrobid 100mg  PO BID x 5 days Called to [facility], [address], [phone]  Contacted patient, date 07/25/2018, time 0900  Lysle Pearl 07/25/2018, 9:07 AM

## 2018-07-25 NOTE — Progress Notes (Signed)
ED Antimicrobial Stewardship Positive Culture Follow Up   Rhonda Evans is an 23 y.o. female who presented to [facility] on 07/22/2018 with a chief complaint of No chief complaint on file.   Recent Results (from the past 720 hour(s))  Urine culture     Status: Abnormal   Collection Time: 07/22/18  3:30 PM  Result Value Ref Range Status   Specimen Description URINE, CLEAN CATCH  Final   Special Requests   Final    NONE Performed at [facility], [address], [place], [place] [address]    Culture >=100,000 COLONIES/mL ESCHERICHIA COLI (A)  Final   Report Status 07/24/2018 FINAL  Final   Organism ID, Bacteria ESCHERICHIA COLI (A)  Final      Susceptibility   Escherichia coli - MIC*    AMPICILLIN >=32 RESISTANT Resistant     CEFAZOLIN <=4 SENSITIVE Sensitive     CEFTRIAXONE <=1 SENSITIVE Sensitive     CIPROFLOXACIN 1 SENSITIVE Sensitive     GENTAMICIN <=1 SENSITIVE Sensitive     IMIPENEM <=0.25 SENSITIVE Sensitive     NITROFURANTOIN <=16 SENSITIVE Sensitive     TRIMETH/SULFA >=320 RESISTANT Resistant     AMPICILLIN/SULBACTAM 16 INTERMEDIATE Intermediate     PIP/TAZO <=4 SENSITIVE Sensitive     Extended ESBL NEGATIVE Sensitive     * >=100,000 COLONIES/mL ESCHERICHIA COLI    []  Treated with N/A, organism resistant to prescribed antimicrobial [x]  Patient discharged originally without antimicrobial agent and treatment is now indicated  New antibiotic prescription: Prescribed 1x dose of fosfomycin in the ED but it is not charted against so it is unclear if she took it. Will follow-up with patient to ask if she took it. If so, no further treatment needed. If not, start macrobid 100mg  PO BID x 5 days  ED Provider: Aviva Kluver, PA   Rhonda Evans, Rhonda Evans 07/25/2018, 8:25 AM Clinical Pharmacist Monday - Friday phone -  [phone] Saturday - Sunday phone - [phone]

## 2018-08-06 ENCOUNTER — Ambulatory Visit (HOSPITAL_COMMUNITY): Payer: Medicaid Other | Admitting: Licensed Clinical Social Worker

## 2018-08-28 ENCOUNTER — Ambulatory Visit (HOSPITAL_COMMUNITY): Payer: Medicaid Other | Admitting: Licensed Clinical Social Worker

## 2018-09-03 ENCOUNTER — Ambulatory Visit (HOSPITAL_COMMUNITY): Payer: Medicaid Other | Admitting: Licensed Clinical Social Worker

## 2018-09-17 ENCOUNTER — Ambulatory Visit (HOSPITAL_COMMUNITY): Payer: Medicaid Other | Admitting: Psychiatry

## 2018-09-23 ENCOUNTER — Other Ambulatory Visit: Payer: Self-pay

## 2018-09-23 ENCOUNTER — Ambulatory Visit (INDEPENDENT_AMBULATORY_CARE_PROVIDER_SITE_OTHER): Payer: Medicaid Other | Admitting: Licensed Clinical Social Worker

## 2018-09-23 DIAGNOSIS — F331 Major depressive disorder, recurrent, moderate: Secondary | ICD-10-CM

## 2018-09-23 NOTE — Progress Notes (Signed)
Virtual Visit via Telephone Note  I connected with Rhonda Evans on 09/23/18 at 12:00 PM EDT by telephone and verified that I am speaking with the correct person using two identifiers.   I discussed the limitations, risks, security and privacy concerns of performing an evaluation and management service by telephone and the availability of in person appointments. I also discussed with the patient that there may be a patient responsible charge related to this service. The patient expressed understanding and agreed to proceed.   I discussed the assessment and treatment plan with the patient. The patient was provided an opportunity to ask questions and all were answered. The patient agreed with the plan and demonstrated an understanding of the instructions.   The patient was advised to call back or seek an in-person evaluation if the symptoms worsen or if the condition fails to improve as anticipated.  I provided 45 minutes of non-face-to-face time during this encounter.    THERAPIST PROGRESS NOTE  Session Time: 12:05-12:50  Participation Level: Active  Behavioral Response: NAAlertAnxious, Depressed and Irritable  Type of Therapy: Individual Therapy  Treatment Goals addressed: Anger and Coping  Interventions: CBT, Motivational Interviewing and Supportive  Summary: Rhonda Evans is a 23 y.o. female who presents with MDD R/O Bipolar R/O Borderline PD. Client reports ongoing visual hallucinations of a black shadow which she also sees when she closes her eyes. Client moved to her own residence with her daughter however reports spending most time at her mothers house with her brother, due to her wanting help with her daughter. Client reports she is easily 'triggered' by her daughter's behavior and does not want to continue to 'lash out' at her when it is not deserved. Client reports she believes due to how she was raised, being moved around often and her mother putting relationships before her  children, client feels she does ont know how to be a mother and does not feel stable enough to take care of her daughter without support, which she is receiving. Client reports continued mood swings daily with a short temper. Client noted it has been hard for her to fall and stay asleep, noting she has been up for 2-3 days with less than 4 hours of sleep and only able to fall asleep with sleeping pills. 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 Client continues to endorse racing thoughts and deny substance use at this time. Client did not attempt the 5 senses skill since last visit, noting her last few visits with therapist and psychiatrist were missed because on those days she just wasn't able to get up and come.  Suicidal/Homicidal: Nowithout intent/plan  Therapist Response:  Clinician spoke with client assessing for SI/HI/psychosis and changes in stressors. Clinician actively listened, providing validation and summarizing statements. Clinician utilized clarifying questions to gather information on specific symptoms. Clinician inquired about skills used since last visit, and discussed reasons for missed visits with therapist and psychiatrist. Client was encouraged to practice coping skills with daughter when needed and practice when not needed to gain mastery. Clinician encouraged client to identify specific triggers for different situations and feelings rather than "everything" and "all the time."  Plan: Return again in 2 weeks.  Diagnosis: Axis I: Major Depression, Recurrent severe     R/O Bipolar Disorder Mixed Episode vs Borderline Personality Disorder   Harlon Ditty, LCSW 09/23/2018

## 2018-09-26 ENCOUNTER — Other Ambulatory Visit: Payer: Self-pay

## 2018-09-26 ENCOUNTER — Ambulatory Visit (INDEPENDENT_AMBULATORY_CARE_PROVIDER_SITE_OTHER): Payer: Medicaid Other

## 2018-09-26 DIAGNOSIS — Z3009 Encounter for other general counseling and advice on contraception: Secondary | ICD-10-CM | POA: Diagnosis not present

## 2018-09-26 DIAGNOSIS — R300 Dysuria: Secondary | ICD-10-CM

## 2018-09-26 DIAGNOSIS — Z202 Contact with and (suspected) exposure to infections with a predominantly sexual mode of transmission: Secondary | ICD-10-CM

## 2018-09-26 MED ORDER — AZITHROMYCIN 500 MG PO TABS: 1000.0000 mg | ORAL_TABLET | Freq: Once | ORAL | 0 refills | Status: AC

## 2018-09-26 NOTE — Progress Notes (Signed)
TELEHEALTH VIRTUAL GYNECOLOGY VISIT ENCOUNTER NOTE  I connected with Rhonda Evans on 09/26/18 at 10:00 AM EDT by telephone at home and verified that I am speaking with the correct person using two identifiers.   I discussed the limitations, risks, security and privacy concerns of performing an evaluation and management service by telephone and the availability of in person appointments. I also discussed with the patient that there may be a patient responsible charge related to this service. The patient expressed understanding and agreed to proceed.   History:  Rhonda Evans is a 23 y.o. G54P1001 female being evaluated today for STD exposure.  She states her most recent partner reports testing positive for CT a week ago.  She reports she has not had sexual intercourse with this partner since finding out, but has vaginal discharge that is runny, milky, and has an odor but is not fishy.  She endorses pelvic pain and states it could be stomach/abdominal pain/aching but it is intermittent that lasts 2-3 minutes but is intense at 7/10.  She also reports odor and discomfort during urination and states she thinks she may have a UTI.  She endorses frequency and hesitancy as well as "feeling like my bladder isn't empty after I pee."  Patient also reports desire for initiation of oral contraception.  She states she has taking pills in the past with good results and denies h/o migraines, htn, or bleeding disorders.  Patient is a non-smoker, but goes on to report that last UPSI was 2 weeks ago and she did not have a menses in March.    Past Medical History:  Diagnosis Date  . Medical history non-contributory    Past Surgical History:  Procedure Laterality Date  . NO PAST SURGERIES     The following portions of the patient's history were reviewed and updated as appropriate: allergies, current medications, past family history, past medical history, past social history, past surgical history and problem list.    Health Maintenance:  Pap due   Review of Systems:  Pertinent items noted in HPI and remainder of comprehensive ROS otherwise negative.  Physical Exam:  Physical exam deferred due to nature of the encounter  Labs and Imaging No results found for this or any previous visit (from the past 336 hour(s)). No results found.    Assessment and Plan:  1. Exposure to sexually transmitted disease (STD) -Patient with known history of multiple STD exposure including CT. -Will send RX for Zithromax 1g to pharmacy on file for immediate treatment. -Discussed no sexual intercourse until at least 7 days after completion of treatment. -Encouraged safe sex practices in the future.   2. Encounter for counseling regarding contraception -Patient requests restart of oral contraception from past; Necon. -Discussed need for UPT in setting of amenorrhea for the month of March. -Patient to report to office for UPT, next week, and provider will send rx based on results. -Patient agreeable to plan and without q/c.  3. Dysuria -Patient instructed to come to office for lab visit so that urine culture can be sent. -Patient with known allergy to amoxicillin and has never had Keflex. -Previous urine species resistant/intermidiate to some treatments. -Will await results and treat accordingly. -Patient agreeable   I discussed the assessment and treatment plan with the patient. The patient was provided an opportunity to ask questions and all were answered. The patient agreed with the plan and demonstrated an understanding of the instructions.   The patient was advised to call back or seek  an in-person evaluation/go to the ED if the symptoms worsen or if the condition fails to improve as anticipated.  I provided >20 minutes of non-face-to-face time during this encounter.   Cherre Robins, CNM [facility] [employer], [facility]

## 2018-09-26 NOTE — Progress Notes (Signed)
TELEHEALTH VISIT  Pt states she had recent exposure to STD  Partner + CT x 1 week ago.  Irregular Periods unsure of LMP. Contraception: None [id number]

## 2018-10-01 ENCOUNTER — Ambulatory Visit: Payer: Medicaid Other

## 2018-10-02 ENCOUNTER — Encounter (HOSPITAL_COMMUNITY): Payer: Self-pay | Admitting: Psychiatry

## 2018-10-02 ENCOUNTER — Ambulatory Visit (INDEPENDENT_AMBULATORY_CARE_PROVIDER_SITE_OTHER): Payer: Medicaid Other | Admitting: Psychiatry

## 2018-10-02 ENCOUNTER — Other Ambulatory Visit: Payer: Self-pay

## 2018-10-02 DIAGNOSIS — F331 Major depressive disorder, recurrent, moderate: Secondary | ICD-10-CM | POA: Diagnosis not present

## 2018-10-02 DIAGNOSIS — F419 Anxiety disorder, unspecified: Secondary | ICD-10-CM

## 2018-10-02 MED ORDER — ESCITALOPRAM OXALATE 10 MG PO TABS: 10.0000 mg | ORAL_TABLET | Freq: Every day | ORAL | 1 refills | Status: DC

## 2018-10-02 MED ORDER — HYDROXYZINE HCL 10 MG PO TABS: ORAL_TABLET | ORAL | 0 refills | Status: DC
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
## 2018-10-07 ENCOUNTER — Ambulatory Visit (INDEPENDENT_AMBULATORY_CARE_PROVIDER_SITE_OTHER): Payer: Medicaid Other | Admitting: Licensed Clinical Social Worker

## 2018-10-07 ENCOUNTER — Other Ambulatory Visit: Payer: Self-pay

## 2018-10-07 DIAGNOSIS — F331 Major depressive disorder, recurrent, moderate: Secondary | ICD-10-CM | POA: Diagnosis not present

## 2018-10-07 NOTE — Progress Notes (Signed)
Virtual Visit via Telephone Note  I connected with Rhonda Evans on 10/07/18 at 12:00 PM EDT by telephone and verified that I am speaking with the correct person using two identifiers.   I discussed the limitations, risks, security and privacy concerns of performing an evaluation and management service by telephone and the availability of in person appointments. I also discussed with the patient that there may be a patient responsible charge related to this service. The patient expressed understanding and agreed to proceed.   I discussed the assessment and treatment plan with the patient. The patient was provided an opportunity to ask questions and all were answered. The patient agreed with the plan and demonstrated an understanding of the instructions.   The patient was advised to call back or seek an in-person evaluation if the symptoms worsen or if the condition fails to improve as anticipated.  I provided 30 minutes of non-face-to-face time during this encounter.   Harlon Ditty, LCSW    THERAPIST PROGRESS NOTE  Session Time: 12pm-12:30pm  Participation Level: Active  Behavioral Response: NAAlertEuthymic and tired (woken up from sleep)  Type of Therapy: Individual Therapy  Treatment Goals addressed: Coping  Interventions: DBT and Supportive  Summary: Rhonda Evans is a 23 y.o. female who presents vi telehealth, phone session, with major depressive disorder. Client reports overall she has had improved sleep since meeting with psychiatrist. Client did have a previous day when she had to take time off work due to yelling at a patient who was raising his voice. Client continues to stay at her brothers house with her daughter rather than stay at home alone. Client reports now this is due to her sleeping a lot and wanting her daughter to have supervision. Client reports still having no appetite and was encouraged to at least at something when taking her medications. Client repots  improved relationship with her boyfriend as they text daily however she does not believe the improvements with last when he returns home. Client reports she is sometimes easily frustrated with herself and has begun to hit herself. Client reports this is a behavior from her past but has increase recently. Client is open to trying TIPP skill and reports the tempeture change is an options she believes she can implement.   Suicidal/Homicidal: Nowithout intent/plan  Therapist Response: Clinician checked in with client, assessing for SI/HI/psychosis and overall level of functioning. Clinician inquired about medication compliance following recent visit with psychiatrist. Clinician inquired about clients interactions with her daughter and level of irritability. Clinician inquired about thoughts and situations prior to client hitting herself when frustrated. Clinician encouraged client to document triggers of hitting self. Clinician provided client with TIPP skill to help with distress tolerance and managing overwhelming emotions without hitting self.  Plan: Return again in  2-3 weeks.  Diagnosis: Axis I: Major Depressive Disorder, recurrent episode, moderate      Harlon Ditty, LCSW 10/07/2018

## 2018-10-08 ENCOUNTER — Other Ambulatory Visit: Payer: Self-pay

## 2018-10-08 ENCOUNTER — Ambulatory Visit (INDEPENDENT_AMBULATORY_CARE_PROVIDER_SITE_OTHER): Payer: Medicaid Other

## 2018-10-08 DIAGNOSIS — Z30011 Encounter for initial prescription of contraceptive pills: Secondary | ICD-10-CM

## 2018-10-08 DIAGNOSIS — R300 Dysuria: Secondary | ICD-10-CM

## 2018-10-08 DIAGNOSIS — Z3202 Encounter for pregnancy test, result negative: Secondary | ICD-10-CM

## 2018-10-08 LAB — POCT URINE PREGNANCY: Preg Test, Ur: NEGATIVE

## 2018-10-08 MED ORDER — NORETHINDRONE-ETH ESTRADIOL 0.5-35 MG-MCG PO TABS: 1.0000 | ORAL_TABLET | Freq: Every day | ORAL | 11 refills | Status: DC

## 2018-10-08 NOTE — Progress Notes (Signed)
Nurse visit for UPT to restart Necon per provider. Pt denies IC x 14 days. UPT neg today Pt still having urinary sx's. UCx sent today. Pt started antidepressants 10/02/18. Reviewed new meds with Sam, CNM. It is safe to take Necon.

## 2018-10-10 ENCOUNTER — Other Ambulatory Visit (HOSPITAL_COMMUNITY): Payer: Self-pay

## 2018-10-10 LAB — URINE CULTURE

## 2018-10-10 MED ORDER — NITROFURANTOIN MONOHYD MACRO 100 MG PO CAPS: 100.0000 mg | ORAL_CAPSULE | Freq: Two times a day (BID) | ORAL | 0 refills | Status: DC

## 2018-10-21 ENCOUNTER — Other Ambulatory Visit: Payer: Self-pay

## 2018-10-21 ENCOUNTER — Ambulatory Visit (HOSPITAL_COMMUNITY): Payer: Medicaid Other | Admitting: Licensed Clinical Social Worker

## 2018-11-13 ENCOUNTER — Other Ambulatory Visit: Payer: Self-pay

## 2018-11-13 ENCOUNTER — Ambulatory Visit (INDEPENDENT_AMBULATORY_CARE_PROVIDER_SITE_OTHER): Payer: Medicaid Other | Admitting: Psychiatry

## 2018-11-13 ENCOUNTER — Telehealth (HOSPITAL_COMMUNITY): Payer: Self-pay | Admitting: Psychiatry

## 2018-11-13 ENCOUNTER — Encounter (HOSPITAL_COMMUNITY): Payer: Self-pay | Admitting: Psychiatry

## 2018-11-13 DIAGNOSIS — F419 Anxiety disorder, unspecified: Secondary | ICD-10-CM

## 2018-11-13 DIAGNOSIS — F331 Major depressive disorder, recurrent, moderate: Secondary | ICD-10-CM | POA: Diagnosis not present

## 2018-11-13 MED ORDER — ESCITALOPRAM OXALATE 20 MG PO TABS: 20.0000 mg | ORAL_TABLET | Freq: Every day | ORAL | 1 refills | Status: DC

## 2018-11-13 MED ORDER — HYDROXYZINE PAMOATE 25 MG PO CAPS: 25.0000 mg | ORAL_CAPSULE | Freq: Every day | ORAL | 0 refills | Status: DC

## 2018-11-13 NOTE — Telephone Encounter (Signed)
D:  Dr. Lolly Mustache referred pt to MH-IOP.  A  Placed call to pt to orient and provide her with a start date, but pt only has Medicaid.  [facility] [place]  [facility]).  Pt states she will look up the phone number. Encouraged pt to contact Dr. Sheela Stack office for a therapist appointment.  R:  Pt receptive.
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
## 2018-11-19 ENCOUNTER — Telehealth: Payer: Self-pay | Admitting: *Deleted

## 2018-11-19 NOTE — Telephone Encounter (Signed)
Pt called to office for tx for UTI. Pt was just treated on 4-17.  Made pt aware she needs to come by office and leave urine sample to be dipped and UC sent for TOC.  Pt made aware she may need a different antibiotic if UTI is not completely tx. Pt made aware she may come this week or have sample at nurse visit next week. Pt states she will check at upcoming appt, advised to call sooner if needed.  Pt made aware message to be sent to provider for further tx recommendations.

## 2018-11-26 ENCOUNTER — Ambulatory Visit (INDEPENDENT_AMBULATORY_CARE_PROVIDER_SITE_OTHER): Payer: Medicaid Other | Admitting: Obstetrics

## 2018-11-26 ENCOUNTER — Other Ambulatory Visit: Payer: Self-pay

## 2018-11-26 VITALS — BP 112/71 | HR 81 | Ht 65.0 in | Wt 168.7 lb

## 2018-11-26 DIAGNOSIS — Z3202 Encounter for pregnancy test, result negative: Secondary | ICD-10-CM

## 2018-11-26 DIAGNOSIS — R319 Hematuria, unspecified: Secondary | ICD-10-CM | POA: Diagnosis not present

## 2018-11-26 DIAGNOSIS — N390 Urinary tract infection, site not specified: Secondary | ICD-10-CM

## 2018-11-26 LAB — POCT URINALYSIS DIPSTICK
Bilirubin, UA: POSITIVE
Glucose, UA: NEGATIVE
Ketones, UA: NEGATIVE
Nitrite, UA: POSITIVE
Protein, UA: POSITIVE — AB
Spec Grav, UA: 1.025 (ref 1.010–1.025)
Urobilinogen, UA: 0.2 E.U./dL
pH, UA: 5 (ref 5.0–8.0)

## 2018-11-26 LAB — POCT URINE PREGNANCY: Preg Test, Ur: NEGATIVE

## 2018-11-26 MED ORDER — CIPROFLOXACIN HCL 500 MG PO TABS: 500.0000 mg | ORAL_TABLET | Freq: Two times a day (BID) | ORAL | 0 refills | Status: DC

## 2018-11-26 NOTE — Progress Notes (Addendum)
Presented for UPT and urine culture.   UPT today is NEGATIVE.  SUBJECTIVE: Rhonda Evans is a 23 y.o. female who complains of urinary frequency, urgency and dysuria x 30 days, bleeding and abdominal pain 7/10 radiating to back, denies fever, chills, or abnormal vaginal discharge. bleeding.   OBJECTIVE: Appears well, in no apparent distress.  Vital signs are normal. Urine dipstick shows positive for leukocytes, nitrates.    ASSESSMENT: Dysuria  PLAN: Treatment per orders.  Rx Cipro, Urine sent to Lab.  Call or return to clinic prn if these symptoms worsen or fail to improve as anticipated per Dr. Clearance Coots   I have reviewed the chart and agree with nursing staff's documentation of this patient's encounter.  Coral Ceo, MD 11/26/2018 12:08 PM

## 2018-11-30 LAB — URINE CULTURE

## 2018-12-04 ENCOUNTER — Encounter (HOSPITAL_COMMUNITY): Payer: Self-pay | Admitting: Psychiatry

## 2018-12-04 ENCOUNTER — Ambulatory Visit (INDEPENDENT_AMBULATORY_CARE_PROVIDER_SITE_OTHER): Payer: Medicaid Other | Admitting: Psychiatry

## 2018-12-04 ENCOUNTER — Other Ambulatory Visit: Payer: Self-pay

## 2018-12-04 DIAGNOSIS — F419 Anxiety disorder, unspecified: Secondary | ICD-10-CM

## 2018-12-04 DIAGNOSIS — F331 Major depressive disorder, recurrent, moderate: Secondary | ICD-10-CM | POA: Diagnosis not present

## 2018-12-04 MED ORDER — ESCITALOPRAM OXALATE 20 MG PO TABS: 20.0000 mg | ORAL_TABLET | Freq: Every day | ORAL | 1 refills | Status: DC

## 2018-12-04 MED ORDER — HYDROXYZINE PAMOATE 25 MG PO CAPS: ORAL_CAPSULE | ORAL | 1 refills | Status: DC
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
## 2019-01-16 ENCOUNTER — Telehealth: Payer: Self-pay

## 2019-01-16 NOTE — Telephone Encounter (Signed)
Pt. Looking for a PCP. Warm transfer to [facility].

## 2019-02-03 ENCOUNTER — Other Ambulatory Visit: Payer: Self-pay

## 2019-02-03 ENCOUNTER — Ambulatory Visit (HOSPITAL_COMMUNITY): Payer: Medicaid Other | Admitting: Psychiatry

## 2019-02-23 ENCOUNTER — Ambulatory Visit: Payer: Medicaid Other | Admitting: Family Medicine

## 2019-05-20 ENCOUNTER — Emergency Department (HOSPITAL_COMMUNITY)
Admission: EM | Admit: 2019-05-20 | Discharge: 2019-05-20 | Disposition: A | Discharge status: Home / Self Care | Payer: Medicaid Other | Attending: Emergency Medicine | Admitting: Emergency Medicine

## 2019-05-20 DIAGNOSIS — R0789 Other chest pain: Secondary | ICD-10-CM | POA: Insufficient documentation

## 2019-05-20 DIAGNOSIS — R519 Headache, unspecified: Secondary | ICD-10-CM | POA: Insufficient documentation

## 2019-05-20 DIAGNOSIS — R05 Cough: Secondary | ICD-10-CM | POA: Diagnosis present

## 2019-05-20 DIAGNOSIS — J069 Acute upper respiratory infection, unspecified: Secondary | ICD-10-CM

## 2019-05-20 DIAGNOSIS — Z79899 Other long term (current) drug therapy: Secondary | ICD-10-CM | POA: Insufficient documentation

## 2019-05-20 DIAGNOSIS — M7918 Myalgia, other site: Secondary | ICD-10-CM | POA: Diagnosis not present

## 2019-05-20 DIAGNOSIS — U071 COVID-19: Secondary | ICD-10-CM | POA: Diagnosis not present

## 2019-05-20 MED ORDER — BENZONATATE 100 MG PO CAPS: 100.0000 mg | ORAL_CAPSULE | Freq: Three times a day (TID) | ORAL | 0 refills | Status: DC

## 2019-05-20 NOTE — ED Triage Notes (Signed)
Pt to ED, reports body aches, head ache, and chest pressure when she coughs, reports stuffy nose and feelings of SHOB at times. All symptoms started 3 days ago. Pt states she has known exposure to COVID. Denies fever.

## 2019-05-20 NOTE — Discharge Instructions (Signed)
Take Tessalon as needed as prescribed for cough.  Take Tylenol as needed as directed for body aches and headache.  You will need to quarantine for the next 10 days regardless of your test result.

## 2019-05-20 NOTE — ED Notes (Addendum)
Pt d/c home per MD order. Discharge summary reviewed with pt, pt verbalizes understanding. No s/s of acute distress noted. Ambulatory off unit.  °

## 2019-05-20 NOTE — ED Provider Notes (Signed)
[facility] EMERGENCY DEPARTMENT Provider Note   CSN: [id number] Arrival date & time: 05/20/19  0623     History   Chief Complaint Chief Complaint  Patient presents with  . Headache    Covid exposure  . Cough  . Chest Pain    HPI Rhonda Evans is a 23 y.o. female.     23yo female with complaint of cough, congestion, headache, sneezing, body aches, onset 3 days ago.  No history of asthma or chronic lung problems, non smoker.  Exposed to 3 people who have COVID, rode in the car with her cousin who has COVID on 11/20, exposed to step father who was admitted for COVID earlier this month.  Rhonda Evans was evaluated in Emergency Department on 05/20/2019 for the symptoms described in the history of present illness. She was evaluated in the context of the global COVID-19 pandemic, which necessitated consideration that the patient might be at risk for infection with the SARS-CoV-2 virus that causes COVID-19. Institutional protocols and algorithms that pertain to the evaluation of patients at risk for COVID-19 are in a state of rapid change based on information released by regulatory bodies including the CDC and federal and state organizations. These policies and algorithms were followed during the patient's care in the ED.      Past Medical History:  Diagnosis Date  . Medical history non-contributory     There are no active problems to display for this patient.   Past Surgical History:  Procedure Laterality Date  . NO PAST SURGERIES       OB History    Gravida  1   Para  1   Term  1   Preterm      AB      Living  1     SAB      TAB      Ectopic      Multiple      Live Births               Home Medications    Prior to Admission medications   Medication Sig Start Date End Date Taking? Authorizing Provider  benzonatate (TESSALON) 100 MG capsule Take 1 capsule (100 mg total) by mouth every 8 (eight) hours. 05/20/19   Jeannie Fend, PA-C  ciprofloxacin (CIPRO) 500 MG tablet Take 1 tablet (500 mg total) by mouth 2 (two) times daily. 11/26/18   Brock Bad, MD  escitalopram (LEXAPRO) 20 MG tablet Take 1 tablet (20 mg total) by mouth daily. 12/04/18 12/04/19  Arfeen, Phillips Grout, MD  hydrOXYzine (VISTARIL) 25 MG capsule Take 1-2 capsule at bed time 12/04/18   Arfeen, Phillips Grout, MD  nitrofurantoin, macrocrystal-monohydrate, (MACROBID) 100 MG capsule Take 1 capsule (100 mg total) by mouth 2 (two) times daily. 10/10/18   Gerrit Heck, CNM  norethindrone-ethinyl estradiol (NECON 0.5/35, 28,) 0.5-35 MG-MCG tablet Take 1 tablet by mouth daily. 10/08/18   Gerrit Heck, CNM    Family History Family History  Problem Relation Age of Onset  . Diabetes Maternal Grandfather   . Hypertension Maternal Grandfather   . Cancer Maternal Grandfather   . Cancer Paternal Grandfather   . Hypertension Maternal Aunt   . Diabetes Maternal Grandmother        great  . Hypertension Maternal Grandmother        great  . Hearing loss Neg Hx     Social History Social History   Tobacco Use  . Smoking status:  Never Smoker  . Smokeless tobacco: Never Used  Substance Use Topics  . Alcohol use: No  . Drug use: No     Allergies   Amoxicillin   Review of Systems Review of Systems  Constitutional: Positive for chills. Negative for fever.  HENT: Positive for congestion, rhinorrhea and sneezing. Negative for sore throat.   Respiratory: Positive for cough.   Gastrointestinal: Negative for abdominal distention, diarrhea, nausea and vomiting.  Musculoskeletal: Positive for arthralgias and myalgias.  Skin: Negative for rash.  Allergic/Immunologic: Negative for immunocompromised state.  Neurological: Positive for headaches.  Hematological: Negative for adenopathy.  Psychiatric/Behavioral: Negative for confusion.  All other systems reviewed and are negative.    Physical Exam Updated Vital Signs BP 115/74   Pulse 79   Temp 99.1 F (37.3  C) (Oral)   Resp 13   Ht 5\' 6"  (1.676 m)   Wt 77.1 kg   SpO2 100%   BMI 27.44 kg/m   Physical Exam Vitals signs and nursing note reviewed.  Constitutional:      General: She is not in acute distress.    Appearance: She is well-developed. She is not diaphoretic.  HENT:     Head: Normocephalic and atraumatic.  Cardiovascular:     Rate and Rhythm: Normal rate and regular rhythm.     Heart sounds: Normal heart sounds.  Pulmonary:     Effort: Pulmonary effort is normal.     Breath sounds: Normal breath sounds.  Chest:     Chest wall: No tenderness.  Musculoskeletal:     Right lower leg: No edema.     Left lower leg: No edema.  Skin:    General: Skin is warm and dry.     Findings: No erythema or rash.  Neurological:     Mental Status: She is alert and oriented to person, place, and time.  Psychiatric:        Behavior: Behavior normal.      ED Treatments / Results  Labs (all labs ordered are listed, but only abnormal results are displayed) Labs Reviewed  NOVEL CORONAVIRUS, NAA (HOSP ORDER, SEND-OUT TO REF LAB; TAT 18-24 HRS)    EKG None  Radiology No results found.  Procedures Procedures (including critical care time)  Medications Ordered in ED Medications - No data to display   Initial Impression / Assessment and Plan / ED Course  I have reviewed the triage vital signs and the nursing notes.  Pertinent labs & imaging results that were available during my care of the patient were reviewed by me and considered in my medical decision making (see chart for details).  Clinical Course as of May 20 931  Wed May 20, 2019  [id number] 23 year old female with complaint of cough, sneezing, congestion and body aches onset after exposure to 3 people known to have Covid.  Patient reports symptom onset 3 days ago, denies fevers or shortness of breath.  Patient reports chest pain with sneezing or coughing otherwise does not have pain in her chest.  On exam, patient is  well-appearing, vitals are reassuring.  Patient agreeable with Covid test and quarantine at home, return as needed.  Given prescription for Tessalon to take as needed for cough, recommend Tylenol for her headaches and body aches.   [LM]    Clinical Course User Index [LM] Jeannie FendMurphy, Kie Calvin A, PA-C      Final Clinical Impressions(s) / ED Diagnoses   Final diagnoses:  Viral upper respiratory tract infection    ED Discharge  Orders         Ordered    benzonatate (TESSALON) 100 MG capsule  Every 8 hours     05/20/19 0925           Tacy Learn, PA-C 05/20/19 0932    Isla Pence, MD 05/20/19 [phone]

## 2019-05-22 ENCOUNTER — Telehealth: Payer: Self-pay

## 2019-05-22 LAB — NOVEL CORONAVIRUS, NAA (HOSP ORDER, SEND-OUT TO REF LAB; TAT 18-24 HRS): SARS-CoV-2, NAA: DETECTED — AB

## 2019-05-22 NOTE — Telephone Encounter (Signed)
Patient called and was told her COVID-19 test was positive 05/20/2019. Test was done in hospital. She states she has chest pain and loss of taste and smell. Symptom tier and criteria for ending isolation were read to patient. Good preventative practices were reviewed.  She verbalized understanding of all information. [place] [facility] will be notified.

## 2019-06-01 ENCOUNTER — Emergency Department (HOSPITAL_COMMUNITY): Payer: Medicaid Other

## 2019-06-01 ENCOUNTER — Encounter (HOSPITAL_COMMUNITY): Payer: Self-pay | Admitting: Emergency Medicine

## 2019-06-01 ENCOUNTER — Other Ambulatory Visit: Payer: Self-pay

## 2019-06-01 ENCOUNTER — Emergency Department (HOSPITAL_COMMUNITY)
Admission: EM | Admit: 2019-06-01 | Discharge: 2019-06-01 | Disposition: A | Discharge status: Home / Self Care | Payer: Medicaid Other | Attending: Emergency Medicine | Admitting: Emergency Medicine

## 2019-06-01 DIAGNOSIS — Z79899 Other long term (current) drug therapy: Secondary | ICD-10-CM | POA: Diagnosis not present

## 2019-06-01 DIAGNOSIS — U071 COVID-19: Secondary | ICD-10-CM

## 2019-06-01 DIAGNOSIS — R05 Cough: Secondary | ICD-10-CM | POA: Diagnosis not present

## 2019-06-01 DIAGNOSIS — R0602 Shortness of breath: Secondary | ICD-10-CM | POA: Diagnosis not present

## 2019-06-01 LAB — URINALYSIS, ROUTINE W REFLEX MICROSCOPIC
Bacteria, UA: NONE SEEN
Bilirubin Urine: NEGATIVE
Glucose, UA: NEGATIVE mg/dL
Ketones, ur: 20 mg/dL — AB
Nitrite: NEGATIVE
Protein, ur: NEGATIVE mg/dL
Specific Gravity, Urine: 1.021 (ref 1.005–1.030)
pH: 6 (ref 5.0–8.0)

## 2019-06-01 LAB — PREGNANCY, URINE: Preg Test, Ur: NEGATIVE

## 2019-06-01 NOTE — ED Triage Notes (Signed)
Pt has covid was diagnosed on 11/25- pt states she has been sob and having chest pain since going home seemed worse today.

## 2019-06-01 NOTE — Discharge Instructions (Signed)
As discussed, your evaluation today has been largely reassuring.  But, it is important that you monitor your condition carefully, and do not hesitate to return to the ED if you develop new, or concerning changes in your condition. ? ?Otherwise, please follow-up with your physician for appropriate ongoing care. ? ?

## 2019-06-01 NOTE — ED Notes (Signed)
Patient Alert and oriented to baseline. Stable and ambulatory to baseline. Patient verbalized understanding of the discharge instructions.  Patient belongings were taken by the patient.   

## 2019-06-01 NOTE — ED Provider Notes (Signed)
[facility] EMERGENCY DEPARTMENT Provider Note   CSN: [id number] Arrival date & time: 06/01/19  1805     History   Chief Complaint Chief Complaint  Patient presents with  . covid +  . Shortness of Breath    HPI Rhonda Evans is a 23 y.o. female.     HPI Patient presents almost 2 weeks after my initial being diagnosed with coronavirus, now with concern for ongoing chest discomfort, dyspnea, cough. Initially the patient denies other complaints, but on review of systems she states that she has some concern for possible pregnancy, occasional polyuria, though no obvious dysuria, no hematuria. Patient notes that she was diagnosed in is doing transiently better, but now, with persistency of symptoms that she has concern.  Past Medical History:  Diagnosis Date  . Medical history non-contributory     There are no active problems to display for this patient.   Past Surgical History:  Procedure Laterality Date  . NO PAST SURGERIES       OB History    Gravida  1   Para  1   Term  1   Preterm      AB      Living  1     SAB      TAB      Ectopic      Multiple      Live Births               Home Medications    Prior to Admission medications   Medication Sig Start Date End Date Taking? Authorizing Provider  hydrOXYzine (VISTARIL) 25 MG capsule Take 1-2 capsule at bed time Patient taking differently: Take 25-50 mg by mouth at bedtime.  12/04/18  Yes Arfeen, Arlyce Harman, MD  benzonatate (TESSALON) 100 MG capsule Take 1 capsule (100 mg total) by mouth every 8 (eight) hours. Patient not taking: Reported on 06/01/2019 05/20/19   Suella Broad A, PA-C  escitalopram (LEXAPRO) 20 MG tablet Take 1 tablet (20 mg total) by mouth daily. Patient not taking: Reported on 06/01/2019 12/04/18 12/04/19  Arfeen, Arlyce Harman, MD  norethindrone-ethinyl estradiol (NECON 0.5/35, 28,) 0.5-35 MG-MCG tablet Take 1 tablet by mouth daily. Patient not taking: Reported on  06/01/2019 10/08/18   Gavin Pound, CNM    Family History Family History  Problem Relation Age of Onset  . Diabetes Maternal Grandfather   . Hypertension Maternal Grandfather   . Cancer Maternal Grandfather   . Cancer Paternal Grandfather   . Hypertension Maternal Aunt   . Diabetes Maternal Grandmother        great  . Hypertension Maternal Grandmother        great  . Hearing loss Neg Hx     Social History Social History   Tobacco Use  . Smoking status: Never Smoker  . Smokeless tobacco: Never Used  Substance Use Topics  . Alcohol use: No  . Drug use: No     Allergies   Amoxicillin   Review of Systems Review of Systems  Constitutional:       Per HPI, otherwise negative  HENT:       Per HPI, otherwise negative  Respiratory:       Per HPI, otherwise negative  Cardiovascular:       Per HPI, otherwise negative  Gastrointestinal: Negative for vomiting.  Endocrine:       Negative aside from HPI  Genitourinary:       Neg aside from HPI  Musculoskeletal:       Per HPI, otherwise negative  Skin: Negative.   Neurological: Negative for syncope.     Physical Exam Updated Vital Signs BP 111/78   Pulse 75   Temp 99.1 F (37.3 C) (Oral)   Resp 19   SpO2 99%   Physical Exam Vitals signs and nursing note reviewed.  Constitutional:      General: She is not in acute distress.    Appearance: She is well-developed.  HENT:     Head: Normocephalic and atraumatic.  Eyes:     Conjunctiva/sclera: Conjunctivae normal.  Cardiovascular:     Rate and Rhythm: Normal rate and regular rhythm.  Pulmonary:     Effort: Pulmonary effort is normal. No respiratory distress.     Breath sounds: Normal breath sounds. No stridor.  Abdominal:     General: There is no distension.  Skin:    General: Skin is warm and dry.  Neurological:     Mental Status: She is alert and oriented to person, place, and time.     Cranial Nerves: No cranial nerve deficit.      ED Treatments /  Results  Labs (all labs ordered are listed, but only abnormal results are displayed) Labs Reviewed  URINALYSIS, ROUTINE W REFLEX MICROSCOPIC - Abnormal; Notable for the following components:      Result Value   APPearance CLOUDY (*)    Hgb urine dipstick SMALL (*)    Ketones, ur 20 (*)    Leukocytes,Ua MODERATE (*)    All other components within normal limits  PREGNANCY, URINE    EKG EKG Interpretation  Date/Time:  Monday June 01 2019 18:22:46 EST Ventricular Rate:  84 PR Interval:  182 QRS Duration: 90 QT Interval:  340 QTC Calculation: 401 R Axis:   34 Text Interpretation: Normal sinus rhythm with sinus arrhythmia Artifact Abnormal ECG Confirmed by Gerhard MunchLockwood, Kyandra Mcclaine [phone]([id number]) on 06/01/2019 8:15:44 PM   Radiology Dg Chest Port 1 View  Result Date: 06/01/2019 CLINICAL DATA:  Shortness of breath x1 day, COVID positive EXAM: PORTABLE CHEST 1 VIEW COMPARISON:  04/16/2018 FINDINGS: Lungs are clear.  No pleural effusion or pneumothorax. Heart is normal in size. IMPRESSION: No evidence of acute cardiopulmonary disease. Electronically Signed   By: Charline BillsSriyesh  Krishnan M.D.   On: 06/01/2019 20:56    Procedures Procedures (including critical care time)  Medications Ordered in ED Medications - No data to display   Initial Impression / Assessment and Plan / ED Course  I have reviewed the triage vital signs and the nursing notes.  Pertinent labs & imaging results that were available during my care of the patient were reviewed by me and considered in my medical decision making (see chart for details).    On repeat exam the patient is awake, alert, aware of all findings now, with evidence for mild dehydration, nitrite negative urine, pregnancy negative, no pneumonia. She remains hemodynamically unremarkable, no hypoxia, no increased work of breathing, no tachypnea or tachycardia. Patient is appropriate for discharge. On discharge she requested refill of her hydroxyzine. This was  deferred to her primary care clinic for follow-up.     [name] was evaluated in Emergency Department on 06/01/2019 for the symptoms described in the history of present illness. She was evaluated in the context of the global COVID-19 pandemic, which necessitated consideration that the patient might be at risk for infection with the SARS-CoV-2 virus that causes COVID-19. Institutional protocols and algorithms that pertain to the evaluation of  patients at risk for COVID-19 are in a state of rapid change based on information released by regulatory bodies including the CDC and federal and state organizations. These policies and algorithms were followed during the patient's care in the ED.   Final Clinical Impressions(s) / ED Diagnoses   Final diagnoses:  COVID-19 virus infection    ED Discharge Orders    None       Gerhard Munch, MD 06/01/19 2303

## 2019-07-16 ENCOUNTER — Other Ambulatory Visit: Payer: Self-pay

## 2019-07-16 ENCOUNTER — Encounter (HOSPITAL_COMMUNITY): Payer: Self-pay

## 2019-07-16 ENCOUNTER — Emergency Department (HOSPITAL_COMMUNITY)
Admission: EM | Admit: 2019-07-16 | Discharge: 2019-07-16 | Disposition: A | Discharge status: Home / Self Care | Payer: Medicaid Other | Attending: Emergency Medicine | Admitting: Emergency Medicine

## 2019-07-16 DIAGNOSIS — Z79899 Other long term (current) drug therapy: Secondary | ICD-10-CM | POA: Insufficient documentation

## 2019-07-16 DIAGNOSIS — J039 Acute tonsillitis, unspecified: Secondary | ICD-10-CM | POA: Insufficient documentation

## 2019-07-16 DIAGNOSIS — J029 Acute pharyngitis, unspecified: Secondary | ICD-10-CM | POA: Diagnosis present

## 2019-07-16 LAB — GROUP A STREP BY PCR: Group A Strep by PCR: NOT DETECTED

## 2019-07-16 LAB — POC URINE PREG, ED: Preg Test, Ur: NEGATIVE

## 2019-07-16 MED ORDER — CLINDAMYCIN HCL 300 MG PO CAPS: 300.0000 mg | ORAL_CAPSULE | Freq: Three times a day (TID) | ORAL | 0 refills | Status: AC

## 2019-07-16 NOTE — ED Triage Notes (Addendum)
Pt states that she has had a sore throat and body aches "from top to bottom". Pt states that she had COVID back in November.  Pt also states that she has not had a menstrual cycle since early December.

## 2019-07-16 NOTE — ED Provider Notes (Signed)
[facility]-EMERGENCY DEPT Provider Note   CSN: [id number] Arrival date & time: 07/16/19  1520     History Chief Complaint  Patient presents with  . Sore Throat  . Generalized Body Aches    Rhonda Evans is a 24 y.o. female.  The history is provided by the patient. No language interpreter was used.  Sore Throat This is a new problem. The current episode started more than 2 days ago. The problem occurs constantly. The problem has been gradually worsening. Pertinent negatives include no abdominal pain. Nothing aggravates the symptoms. Nothing relieves the symptoms. She has tried nothing for the symptoms. The treatment provided moderate relief.   Pt complains of swollen tonsils.  Pt reports she had covid in November.     Past Medical History:  Diagnosis Date  . Medical history non-contributory     There are no problems to display for this patient.   Past Surgical History:  Procedure Laterality Date  . NO PAST SURGERIES       OB History    Gravida  1   Para  1   Term  1   Preterm      AB      Living  1     SAB      TAB      Ectopic      Multiple      Live Births              Family History  Problem Relation Age of Onset  . Diabetes Maternal Grandfather   . Hypertension Maternal Grandfather   . Cancer Maternal Grandfather   . Cancer Paternal Grandfather   . Hypertension Maternal Aunt   . Diabetes Maternal Grandmother        great  . Hypertension Maternal Grandmother        great  . Hearing loss Neg Hx     Social History   Tobacco Use  . Smoking status: Never Smoker  . Smokeless tobacco: Never Used  Substance Use Topics  . Alcohol use: No  . Drug use: No    Home Medications Prior to Admission medications   Medication Sig Start Date End Date Taking? Authorizing Provider  benzonatate (TESSALON) 100 MG capsule Take 1 capsule (100 mg total) by mouth every 8 (eight) hours. Patient not taking: Reported on 06/01/2019  05/20/19   Army Melia A, PA-C  clindamycin (CLEOCIN) 300 MG capsule Take 1 capsule (300 mg total) by mouth 3 (three) times daily for 10 days. 07/16/19 07/26/19  Elson Areas, PA-C  escitalopram (LEXAPRO) 20 MG tablet Take 1 tablet (20 mg total) by mouth daily. Patient not taking: Reported on 06/01/2019 12/04/18 12/04/19  Arfeen, Phillips Grout, MD  hydrOXYzine (VISTARIL) 25 MG capsule Take 1-2 capsule at bed time Patient taking differently: Take 25-50 mg by mouth at bedtime.  12/04/18   Arfeen, Phillips Grout, MD  norethindrone-ethinyl estradiol (NECON 0.5/35, 28,) 0.5-35 MG-MCG tablet Take 1 tablet by mouth daily. Patient not taking: Reported on 06/01/2019 10/08/18   Gerrit Heck, CNM    Allergies    Amoxicillin  Review of Systems   Review of Systems  Gastrointestinal: Negative for abdominal pain.  All other systems reviewed and are negative.   Physical Exam Updated Vital Signs BP (!) 118/91   Pulse (!) 112   Temp 99.1 F (37.3 C)   Resp 18   LMP 06/01/2019   SpO2 98%   Physical Exam Vitals and nursing note reviewed.  Constitutional:      Appearance: She is well-developed.  HENT:     Head: Normocephalic.     Right Ear: Tympanic membrane normal.     Mouth/Throat:     Mouth: Mucous membranes are moist.  Cardiovascular:     Rate and Rhythm: Normal rate.  Pulmonary:     Effort: Pulmonary effort is normal.  Abdominal:     General: There is no distension.     Palpations: Abdomen is soft.  Musculoskeletal:        General: Normal range of motion.     Cervical back: Normal range of motion.  Skin:    General: Skin is warm.  Neurological:     Mental Status: She is alert and oriented to person, place, and time.  Psychiatric:        Mood and Affect: Mood normal.     ED Results / Procedures / Treatments   Labs (all labs ordered are listed, but only abnormal results are displayed) Labs Reviewed  GROUP A STREP BY PCR  POC URINE PREG, ED    EKG None  Radiology No results found.   Procedures Procedures (including critical care time)  Medications Ordered in ED Medications - No data to display  ED Course  I have reviewed the triage vital signs and the nursing notes.  Pertinent labs & imaging results that were available during my care of the patient were reviewed by me and considered in my medical decision making (see chart for details).    MDM Rules/Calculators/A&P                      MDM  Strep negative,  Pt given clindamycin rx.  Final Clinical Impression(s) / ED Diagnoses Final diagnoses:  Tonsillitis    Rx / DC Orders ED Discharge Orders         Ordered    clindamycin (CLEOCIN) 300 MG capsule  3 times daily     07/16/19 1715        An After Visit Summary was printed and given to the patient.    Fransico Meadow, Hershal Coria 07/16/19 1953    Lajean Saver, MD 07/17/19 [id number]

## 2019-07-16 NOTE — Discharge Instructions (Signed)
Return if any problems.

## 2019-07-16 NOTE — ED Notes (Signed)
An After Visit Summary was printed and given to the patient. Discharge instructions given and no further questions at this time.  

## 2019-07-29 ENCOUNTER — Ambulatory Visit: Payer: Self-pay | Admitting: *Deleted

## 2019-07-29 NOTE — Telephone Encounter (Signed)
3 attempts to reach pt.

## 2019-07-29 NOTE — Telephone Encounter (Signed)
Attempted to contact patient. Mailbox full.

## 2020-02-21 ENCOUNTER — Encounter (HOSPITAL_COMMUNITY): Payer: Self-pay

## 2020-02-21 ENCOUNTER — Ambulatory Visit (HOSPITAL_COMMUNITY)
Admission: EM | Admit: 2020-02-21 | Discharge: 2020-02-21 | Disposition: A | Discharge status: Home / Self Care | Payer: Medicaid Other | Attending: Urgent Care | Admitting: Urgent Care

## 2020-02-21 ENCOUNTER — Other Ambulatory Visit: Payer: Self-pay

## 2020-02-21 DIAGNOSIS — R109 Unspecified abdominal pain: Secondary | ICD-10-CM

## 2020-02-21 DIAGNOSIS — R1030 Lower abdominal pain, unspecified: Secondary | ICD-10-CM | POA: Diagnosis present

## 2020-02-21 DIAGNOSIS — Z3202 Encounter for pregnancy test, result negative: Secondary | ICD-10-CM

## 2020-02-21 DIAGNOSIS — R102 Pelvic and perineal pain: Secondary | ICD-10-CM | POA: Diagnosis present

## 2020-02-21 DIAGNOSIS — R110 Nausea: Secondary | ICD-10-CM | POA: Diagnosis present

## 2020-02-21 LAB — POCT URINALYSIS DIPSTICK, ED / UC
Glucose, UA: NEGATIVE mg/dL
Ketones, ur: 15 mg/dL — AB
Leukocytes,Ua: NEGATIVE
Nitrite: NEGATIVE
Protein, ur: NEGATIVE mg/dL
Specific Gravity, Urine: >=1.03 (ref 1.005–1.030)
Urobilinogen, UA: 0.2 mg/dL (ref 0.0–1.0)
pH: 5.5 (ref 5.0–8.0)

## 2020-02-21 LAB — POC URINE PREG, ED: Preg Test, Ur: NEGATIVE

## 2020-02-21 MED ORDER — ONDANSETRON 8 MG PO TBDP: 8.0000 mg | ORAL_TABLET | Freq: Three times a day (TID) | ORAL | 0 refills | Status: DC | PRN

## 2020-02-21 MED ORDER — NAPROXEN 375 MG PO TABS: 375.0000 mg | ORAL_TABLET | Freq: Two times a day (BID) | ORAL | 0 refills | Status: DC

## 2020-02-21 NOTE — ED Triage Notes (Signed)
Pt presents with lower abdominal pain, radiates to the back and nausea x 1 week.

## 2020-02-21 NOTE — ED Provider Notes (Signed)
[facility]   MRN: [id number] DOB: 03-Jul-1995  Subjective:   Rhonda Evans is a 24 y.o. female presenting for 1 week history of persistent lower abdominal pain that can radiate toward her back.  Has also had nausea without vomiting.  Patient is sexually active, does not use condoms for protection.  She has 1 female sex partner.  She is not opposed to STI testing.  No current facility-administered medications for this encounter.  Current Outpatient Medications:  .  benzonatate (TESSALON) 100 MG capsule, Take 1 capsule (100 mg total) by mouth every 8 (eight) hours. (Patient not taking: Reported on 06/01/2019), Disp: 21 capsule, Rfl: 0 .  escitalopram (LEXAPRO) 20 MG tablet, Take 1 tablet (20 mg total) by mouth daily. (Patient not taking: Reported on 06/01/2019), Disp: 30 tablet, Rfl: 1 .  hydrOXYzine (VISTARIL) 25 MG capsule, Take 1-2 capsule at bed time (Patient taking differently: Take 25-50 mg by mouth at bedtime. ), Disp: 60 capsule, Rfl: 1 .  norethindrone-ethinyl estradiol (NECON 0.5/35, 28,) 0.5-35 MG-MCG tablet, Take 1 tablet by mouth daily. (Patient not taking: Reported on 06/01/2019), Disp: 1 Package, Rfl: 11   Allergies  Allergen Reactions  . Amoxicillin Anaphylaxis and Rash    Has patient had a PCN reaction causing immediate rash, facial/tongue/throat swelling, SOB or lightheadedness with hypotension: Yes Has patient had a PCN reaction causing severe rash involving mucus membranes or skin necrosis: No Has patient had a PCN reaction that required hospitalization Yes Has patient had a PCN reaction occurring within the last 10 years: No If all of the above answers are "NO", then may proceed with Cephalosporin use.     Past Medical History:  Diagnosis Date  . Medical history non-contributory      Past Surgical History:  Procedure Laterality Date  . NO PAST SURGERIES      Family History  Problem Relation Age of Onset  . Diabetes Maternal Grandfather   . Hypertension  Maternal Grandfather   . Cancer Maternal Grandfather   . Cancer Paternal Grandfather   . Hypertension Maternal Aunt   . Diabetes Maternal Grandmother        great  . Hypertension Maternal Grandmother        great  . Hearing loss Neg Hx     Social History   Tobacco Use  . Smoking status: Never Smoker  . Smokeless tobacco: Never Used  Vaping Use  . Vaping Use: Never used  Substance Use Topics  . Alcohol use: No  . Drug use: No    ROS   Objective:   Vitals: BP 111/73 (BP Location: Right Arm)   Pulse 83   Temp 98 F (36.7 C) (Oral)   Resp 16   LMP  (Within Months) Comment: 1 month  SpO2 98%   Physical Exam Constitutional:      General: She is not in acute distress.    Appearance: Normal appearance. She is well-developed. She is not ill-appearing, toxic-appearing or diaphoretic.  HENT:     Head: Normocephalic and atraumatic.     Nose: Nose normal.     Mouth/Throat:     Mouth: Mucous membranes are moist.     Pharynx: Oropharynx is clear.  Eyes:     General: No scleral icterus.    Extraocular Movements: Extraocular movements intact.     Pupils: Pupils are equal, round, and reactive to light.  Cardiovascular:     Rate and Rhythm: Normal rate.  Pulmonary:     Effort: Pulmonary  effort is normal.  Abdominal:     General: Bowel sounds are normal. There is no distension.     Palpations: Abdomen is soft.     Tenderness: There is generalized abdominal tenderness and tenderness in the right lower quadrant, periumbilical area, suprapubic area and left lower quadrant. There is no right CVA tenderness, left CVA tenderness, guarding or rebound.  Skin:    General: Skin is warm and dry.  Neurological:     General: No focal deficit present.     Mental Status: She is alert and oriented to person, place, and time.  Psychiatric:        Mood and Affect: Mood normal.        Behavior: Behavior normal.     Results for orders placed or performed during the hospital encounter of  02/21/20 (from the past 24 hour(s))  POC Urinalysis dipstick     Status: Abnormal   Collection Time: 02/21/20  5:48 PM  Result Value Ref Range   Glucose, UA NEGATIVE NEGATIVE mg/dL   Bilirubin Urine SMALL (A) NEGATIVE   Ketones, ur 15 (A) NEGATIVE mg/dL   Specific Gravity, Urine >=1.030 1.005 - 1.030   Hgb urine dipstick SMALL (A) NEGATIVE   pH 5.5 5.0 - 8.0   Protein, ur NEGATIVE NEGATIVE mg/dL   Urobilinogen, UA 0.2 0.0 - 1.0 mg/dL   Nitrite NEGATIVE NEGATIVE   Leukocytes,Ua NEGATIVE NEGATIVE  POC urine pregnancy     Status: None   Collection Time: 02/21/20  5:54 PM  Result Value Ref Range   Preg Test, Ur NEGATIVE NEGATIVE    Assessment and Plan :   PDMP not reviewed this encounter.  1. Lower abdominal pain   2. Pelvic pain in female   3. Nausea     Discussed with patient differential for pelvic pain in a female which includes STI, gynecologic source such as uterine fibroid, ovarian cyst, endometriosis.  Patient is inclined to just wait for the STI test results before doing treatment.  Urine culture pending, use naproxen, Zofran for supportive care.  Follow-up with gynecologist for further work-up of pelvic pain. Counseled patient on potential for adverse effects with medications prescribed/recommended today, ER and return-to-clinic precautions discussed, patient verbalized understanding.    Wallis Bamberg, [place] 02/21/20 [id number]

## 2020-02-22 LAB — CERVICOVAGINAL ANCILLARY ONLY
Bacterial Vaginitis (gardnerella): NEGATIVE
Candida Glabrata: NEGATIVE
Candida Vaginitis: NEGATIVE
Chlamydia: NEGATIVE
Comment: NEGATIVE
Comment: NEGATIVE
Comment: NEGATIVE
Comment: NEGATIVE
Comment: NEGATIVE
Comment: NORMAL
Neisseria Gonorrhea: NEGATIVE
Trichomonas: NEGATIVE

## 2020-02-22 LAB — URINE CULTURE: Culture: <10000 — AB

## 2020-07-23 ENCOUNTER — Encounter (HOSPITAL_COMMUNITY): Payer: Self-pay | Admitting: Emergency Medicine

## 2020-07-23 ENCOUNTER — Emergency Department (HOSPITAL_COMMUNITY): Payer: Medicaid Other

## 2020-07-23 ENCOUNTER — Other Ambulatory Visit: Payer: Self-pay

## 2020-07-23 ENCOUNTER — Emergency Department (HOSPITAL_COMMUNITY)
Admission: EM | Admit: 2020-07-23 | Discharge: 2020-07-23 | Disposition: A | Discharge status: Home / Self Care | Payer: Medicaid Other | Attending: Emergency Medicine | Admitting: Emergency Medicine

## 2020-07-23 DIAGNOSIS — N3001 Acute cystitis with hematuria: Secondary | ICD-10-CM | POA: Diagnosis not present

## 2020-07-23 DIAGNOSIS — R0789 Other chest pain: Secondary | ICD-10-CM

## 2020-07-23 DIAGNOSIS — K219 Gastro-esophageal reflux disease without esophagitis: Secondary | ICD-10-CM | POA: Diagnosis not present

## 2020-07-23 DIAGNOSIS — B9689 Other specified bacterial agents as the cause of diseases classified elsewhere: Secondary | ICD-10-CM | POA: Diagnosis not present

## 2020-07-23 LAB — CBC
HCT: 36.6 % (ref 36.0–46.0)
Hemoglobin: 12 g/dL (ref 12.0–15.0)
MCH: 25 pg — ABNORMAL LOW (ref 26.0–34.0)
MCHC: 32.8 g/dL (ref 30.0–36.0)
MCV: 76.3 fL — ABNORMAL LOW (ref 80.0–100.0)
Platelets: 418 K/uL — ABNORMAL HIGH (ref 150–400)
RBC: 4.8 MIL/uL (ref 3.87–5.11)
RDW: 15.1 % (ref 11.5–15.5)
WBC: 4.9 K/uL (ref 4.0–10.5)
nRBC: 0 % (ref 0.0–0.2)

## 2020-07-23 LAB — BASIC METABOLIC PANEL WITH GFR
Anion gap: 8 (ref 5–15)
BUN: 7 mg/dL (ref 6–20)
CO2: 25 mmol/L (ref 22–32)
Calcium: 9.3 mg/dL (ref 8.9–10.3)
Chloride: 104 mmol/L (ref 98–111)
Creatinine, Ser: 0.64 mg/dL (ref 0.44–1.00)
GFR, Estimated: >60 mL/min
Glucose, Bld: 102 mg/dL — ABNORMAL HIGH (ref 70–99)
Potassium: 3.4 mmol/L — ABNORMAL LOW (ref 3.5–5.1)
Sodium: 137 mmol/L (ref 135–145)

## 2020-07-23 LAB — URINALYSIS, ROUTINE W REFLEX MICROSCOPIC
Bilirubin Urine: NEGATIVE
Glucose, UA: NEGATIVE mg/dL
Hgb urine dipstick: NEGATIVE
Ketones, ur: 5 mg/dL — AB
Nitrite: NEGATIVE
Protein, ur: NEGATIVE mg/dL
Specific Gravity, Urine: 1.011 (ref 1.005–1.030)
pH: 5 (ref 5.0–8.0)

## 2020-07-23 LAB — TROPONIN I (HIGH SENSITIVITY)
Troponin I (High Sensitivity): 4 ng/L
Troponin I (High Sensitivity): 3 ng/L (ref <18)

## 2020-07-23 LAB — I-STAT BETA HCG BLOOD, ED (MC, WL, AP ONLY): I-stat hCG, quantitative: <5 m[IU]/mL

## 2020-07-23 MED ORDER — ALUM & MAG HYDROXIDE-SIMETH 200-200-20 MG/5ML PO SUSP
30.0000 mL | Freq: Once | ORAL | Status: AC
  Administered 2020-07-23: 30 mL via ORAL
  Filled 2020-07-23: qty 30

## 2020-07-23 MED ORDER — LIDOCAINE VISCOUS HCL 2 % MT SOLN
15.0000 mL | Freq: Once | OROMUCOSAL | Status: AC
  Administered 2020-07-23: 15 mL via ORAL
  Filled 2020-07-23: qty 15

## 2020-07-23 MED ORDER — PANTOPRAZOLE SODIUM 20 MG PO TBEC: 20.0000 mg | DELAYED_RELEASE_TABLET | Freq: Every day | ORAL | 0 refills | Status: DC

## 2020-07-23 MED ORDER — NITROFURANTOIN MONOHYD MACRO 100 MG PO CAPS: 100.0000 mg | ORAL_CAPSULE | Freq: Two times a day (BID) | ORAL | 0 refills | Status: AC

## 2020-07-23 NOTE — ED Triage Notes (Signed)
Pt reports pain to center of chest that radiates to back with SOB and nausea since last night.  Denies nausea at present.

## 2020-07-23 NOTE — Discharge Instructions (Signed)
You were evaluated in the emergency room today for your chest pain.  Your blood work, chest x-ray, and EKG were reassuring.  There is not any emergent issue with your heart or your lungs.  There is no sign of infection in your lungs, and there is no sign of heart attack or stress on your heart.  I suspect that your chest pain is secondary to inflammation in your stomach and lower esophagus secondary to acid reflux.  You have been prescribed a medication called Protonix which you should take daily for the next 30 days to treat your reflux.  You were found to have urinary tract infection.  You have been prescribed a medication called Macrobid which you should take twice daily for the entire course to clear urinary tract infection.  Below is the contact information for the cardiology office.  You should call them first thing Monday morning to schedule follow-up appointment for your atypical chest pain.  Return the emergency department if you develop worsening chest pain, difficulty breathing, nausea or vomiting that does not stop, or any other new severe symptoms.

## 2020-07-23 NOTE — ED Provider Notes (Signed)
[facility] EMERGENCY DEPARTMENT Provider Note   CSN: [id number] Arrival date & time: 07/23/20  1440     History No chief complaint on file.   Rhonda Evans is a 25 y.o. female who presents with concern for 24 hours of central lower chest/epigastric pain and burning since last night.  Patient states that pain has been constant since that time, is worse after she eats, and has associated nausea.  She denies any palpitations, shortness of breath, vomiting, diarrhea, abdominal pain.  She has history of GERD, but does not take any medications for this.  She is unable to identify whether or not this pain feels similar.  She denies any congestion, sore throat, any other HEENT symptoms.  She is vaccinated as COVID-19.  Additionally endorses urinary frequency and urgency for the last week.  History of urinary tract infection in the past.  Denies sexual activity, is not on any hormone replacement therapy, has not had any recent prolonged travel or immobilization, no recent surgical procedures, no history of malignancy or clot in the past.  I personally read this patient's medical records.  She does not carry medical diagnoses and is not on any medications every day.  HPI     Past Medical History:  Diagnosis Date  . Medical history non-contributory     There are no problems to display for this patient.   Past Surgical History:  Procedure Laterality Date  . NO PAST SURGERIES       OB History    Gravida  1   Para  1   Term  1   Preterm      AB      Living  1     SAB      IAB      Ectopic      Multiple      Live Births              Family History  Problem Relation Age of Onset  . Diabetes Maternal Grandfather   . Hypertension Maternal Grandfather   . Cancer Maternal Grandfather   . Cancer Paternal Grandfather   . Hypertension Maternal Aunt   . Diabetes Maternal Grandmother        great  . Hypertension Maternal Grandmother        great  .  Hearing loss Neg Hx     Social History   Tobacco Use  . Smoking status: Never Smoker  . Smokeless tobacco: Never Used  Vaping Use  . Vaping Use: Never used  Substance Use Topics  . Alcohol use: No  . Drug use: No    Home Medications Prior to Admission medications   Medication Sig Start Date End Date Taking? Authorizing Provider  nitrofurantoin, macrocrystal-monohydrate, (MACROBID) 100 MG capsule Take 1 capsule (100 mg total) by mouth 2 (two) times daily for 5 days. 07/23/20 07/28/20 Yes Rubie Ficco, Lupe Carney R, PA-C  pantoprazole (PROTONIX) 20 MG tablet Take 1 tablet (20 mg total) by mouth daily. 07/23/20 08/22/20 Yes Brayson Livesey, Lupe Carney R, PA-C  benzonatate (TESSALON) 100 MG capsule Take 1 capsule (100 mg total) by mouth every 8 (eight) hours. Patient not taking: Reported on 06/01/2019 05/20/19   Army Melia A, PA-C  escitalopram (LEXAPRO) 20 MG tablet Take 1 tablet (20 mg total) by mouth daily. Patient not taking: Reported on 06/01/2019 12/04/18 12/04/19  Arfeen, Phillips Grout, MD  hydrOXYzine (VISTARIL) 25 MG capsule Take 1-2 capsule at bed time Patient taking differently: Take 25-50 mg  by mouth at bedtime.  12/04/18   Arfeen, Phillips Grout, MD  naproxen (NAPROSYN) 375 MG tablet Take 1 tablet (375 mg total) by mouth 2 (two) times daily with a meal. 02/21/20   Wallis Bamberg, PA-C  norethindrone-ethinyl estradiol (NECON 0.5/35, 28,) 0.5-35 MG-MCG tablet Take 1 tablet by mouth daily. Patient not taking: Reported on 06/01/2019 10/08/18   Gerrit Heck, CNM  ondansetron (ZOFRAN-ODT) 8 MG disintegrating tablet Take 1 tablet (8 mg total) by mouth every 8 (eight) hours as needed for nausea or vomiting. 02/21/20   Wallis Bamberg, PA-C    Allergies    Amoxicillin  Review of Systems   Review of Systems  Constitutional: Negative.   HENT: Negative.   Respiratory: Negative.  Negative for cough and shortness of breath.   Cardiovascular: Positive for chest pain. Negative for palpitations and leg swelling.   Gastrointestinal: Negative.   Endocrine: Negative.   Genitourinary: Positive for frequency and urgency. Negative for decreased urine volume, difficulty urinating, dysuria, flank pain, hematuria, vaginal bleeding, vaginal discharge and vaginal pain.  Musculoskeletal: Negative.   Skin: Negative.   Neurological: Negative.     Physical Exam Updated Vital Signs BP 117/74 (BP Location: Right Arm)   Pulse 74   Temp 98.1 F (36.7 C) (Oral)   Resp 19   LMP 06/08/2020   SpO2 100%   Physical Exam Vitals and nursing note reviewed. Exam conducted with a chaperone present.  HENT:     Head: Normocephalic and atraumatic.     Nose: Nose normal.     Mouth/Throat:     Mouth: Mucous membranes are moist.     Pharynx: Oropharynx is clear. Uvula midline. No pharyngeal swelling, oropharyngeal exudate, posterior oropharyngeal erythema or uvula swelling.     Tonsils: No tonsillar exudate.  Eyes:     General: Lids are normal. Vision grossly intact.        Right eye: No discharge.        Left eye: No discharge.     Extraocular Movements: Extraocular movements intact.     Conjunctiva/sclera: Conjunctivae normal.     Pupils: Pupils are equal, round, and reactive to light.  Neck:     Trachea: Trachea and phonation normal.  Cardiovascular:     Rate and Rhythm: Normal rate and regular rhythm.     Pulses: Normal pulses.          Radial pulses are 2+ on the right side and 2+ on the left side.       Dorsalis pedis pulses are 2+ on the right side and 2+ on the left side.     Heart sounds: Normal heart sounds. No murmur heard.   Pulmonary:     Effort: Pulmonary effort is normal. No bradypnea, accessory muscle usage, prolonged expiration or respiratory distress.     Breath sounds: Normal breath sounds. No wheezing or rales.  Chest:     Chest wall: No lacerations, deformity, swelling, tenderness, crepitus or edema.  Breasts:     Right: Normal.     Left: Normal.    Abdominal:     General: Bowel  sounds are normal. There is no distension.     Palpations: Abdomen is soft.     Tenderness: There is no abdominal tenderness.  Musculoskeletal:        General: No deformity.     Cervical back: Full passive range of motion without pain, normal range of motion and neck supple. No crepitus. No pain with movement, spinous process tenderness or  muscular tenderness.     Right lower leg: No edema.     Left lower leg: No edema.  Lymphadenopathy:     Cervical: No cervical adenopathy.  Skin:    General: Skin is warm and dry.     Capillary Refill: Capillary refill takes less than 2 seconds.  Neurological:     General: No focal deficit present.     Mental Status: She is alert and oriented to person, place, and time.     Cranial Nerves: Cranial nerves are intact.     Sensory: Sensation is intact.     Motor: Motor function is intact.     Gait: Gait is intact.  Psychiatric:        Mood and Affect: Mood normal.     ED Results / Procedures / Treatments   Labs (all labs ordered are listed, but only abnormal results are displayed) Labs Reviewed  BASIC METABOLIC PANEL - Abnormal; Notable for the following components:      Result Value   Potassium 3.4 (*)    Glucose, Bld 102 (*)    All other components within normal limits  CBC - Abnormal; Notable for the following components:   MCV 76.3 (*)    MCH 25.0 (*)    Platelets 418 (*)    All other components within normal limits  URINALYSIS, ROUTINE W REFLEX MICROSCOPIC - Abnormal; Notable for the following components:   APPearance CLOUDY (*)    Ketones, ur 5 (*)    Leukocytes,Ua LARGE (*)    Bacteria, UA RARE (*)    All other components within normal limits  I-STAT BETA HCG BLOOD, ED ([facility], [facility], [facility] ONLY)  TROPONIN I (HIGH SENSITIVITY)  TROPONIN I (HIGH SENSITIVITY)    EKG EKG Interpretation  Date/Time:  Saturday July 23 2020 14:49:18 EST Ventricular Rate:  91 PR Interval:  176 QRS Duration: 94 QT Interval:  340 QTC Calculation: 418 R  Axis:   58 Text Interpretation: Normal sinus rhythm Normal ECG since last tracing no significant change Confirmed by Eber Hong ([id number]) on 07/23/2020 6:26:04 PM   Radiology DG Chest 2 View  Result Date: 07/23/2020 CLINICAL DATA:  Sudden onset chest tightness, shortness of breath and nausea. COVID-19 positive test on 07/12/2020. EXAM: CHEST - 2 VIEW COMPARISON:  06/01/2019 FINDINGS: The heart size and mediastinal contours are within normal limits. Both lungs are clear. The visualized skeletal structures are unremarkable. IMPRESSION: Normal examination. Electronically Signed   By: Beckie Salts M.D.   On: 07/23/2020 15:18    Procedures Procedures   Medications Ordered in ED Medications  alum & mag hydroxide-simeth (MAALOX/MYLANTA) 200-200-20 MG/5ML suspension 30 mL (30 mLs Oral Given 07/23/20 1918)    And  lidocaine (XYLOCAINE) 2 % viscous mouth solution 15 mL (15 mLs Oral Given 07/23/20 1918)    ED Course  I have reviewed the triage vital signs and the nursing notes.  Pertinent labs & imaging results that were available during my care of the patient were reviewed by me and considered in my medical decision making (see chart for details).    MDM Rules/Calculators/A&P                         25 year old female presents with concern for 24 hours of epigastric and low central chest pain and burning.  History of GERD.  Differential diagnosis of epigastric pain includes but is not limited to: Functional or nonulcer dyspepsia, PUD, GERD, Gastritis, (NSAIDs, alcohol, stress,  H. pylori, pernicious anemia), pancreatitis / pancreatic cancer, overeating indigestion (high-fat foods, coffee), drugs (aspirin, antibiotics (eg, macrolides, metronidazole), corticosteroids, digoxin, narcotics, theophylline), gastroparesis, gastric volvulus, gastric cancer, lactose intolerance, malabsorption, parasitic infection (Giardia, Strongyloides, Ascaris), abdominal hernia, intestinal ischemia, esophageal rupture,   cholelithiasis /choledocholithiasis / cholangitis, hepatitis, ACS, pericarditis, pneumonia, pregnancy.  Vital signs are normal on intake.  Physical exam is reassuring.  Cardiopulmonary exam is normal, abdominal exam is benign.  Patient is neurologically intact.  Basic laboratory studies obtained in triage.  CMP without leukocytosis or anemia, BMP unremarkable.  Initial troponin negative, 4, patient is not pregnant.  UA and delta Trope pending. EKG NSR, CXR negative. Will proceed with GI cocktail.  Patient did not tolerate GI cocktail due to taste and spit it back out.  Delta troponin negative, 3. UA suggestive of infection.   Given reassuring laboratory studies, EKG, chest x-ray,  and physical exam, no further work-up is warranted in the ED at this time.  No concern for ACS or pneumonia at this time.  Suspect patient symptoms are secondary to gastritis versus GERD.  Will discharge with antibiotics for urinary tract infection, prescription for PPI.  Zephyra voiced understanding of her medical evaluation and treatment.  Each of her questions was answered to her expressed satisfaction. Return precautions given. Cardiology follow up. Patient is well-appearing, stable, and appropriate for discharge at this time.  This chart was dictated using voice recognition software, Dragon. Despite the best efforts of this provider to proofread and correct errors, errors may still occur which can change documentation meaning.  Final Clinical Impression(s) / ED Diagnoses Final diagnoses:  Atypical chest pain  Gastroesophageal reflux disease, unspecified whether esophagitis present  Acute cystitis with hematuria    Rx / DC Orders ED Discharge Orders         Ordered    pantoprazole (PROTONIX) 20 MG tablet  Daily        07/23/20 2017    nitrofurantoin, macrocrystal-monohydrate, (MACROBID) 100 MG capsule  2 times daily        07/23/20 2102           Marwah Disbro, Idelia Salm 07/23/20 2353    Eber Hong, MD 07/24/20 1505

## 2020-10-18 ENCOUNTER — Emergency Department (HOSPITAL_COMMUNITY)
Admission: EM | Admit: 2020-10-18 | Discharge: 2020-10-19 | Disposition: A | Discharge status: Home / Self Care | Payer: Medicaid Other | Attending: Emergency Medicine | Admitting: Emergency Medicine

## 2020-10-18 ENCOUNTER — Encounter (HOSPITAL_COMMUNITY): Payer: Self-pay | Admitting: Emergency Medicine

## 2020-10-18 ENCOUNTER — Other Ambulatory Visit: Payer: Self-pay

## 2020-10-18 DIAGNOSIS — K08409 Partial loss of teeth, unspecified cause, unspecified class: Secondary | ICD-10-CM | POA: Insufficient documentation

## 2020-10-18 DIAGNOSIS — T82898A Other specified complication of vascular prosthetic devices, implants and grafts, initial encounter: Secondary | ICD-10-CM | POA: Insufficient documentation

## 2020-10-18 DIAGNOSIS — Y658 Other specified misadventures during surgical and medical care: Secondary | ICD-10-CM | POA: Insufficient documentation

## 2020-10-18 NOTE — ED Provider Notes (Signed)
  Emergency Medicine Provider in Triage Note   MSE was initiated and I personally evaluated the patient  10:47 PM on October 18, 2020 as provider in triage.   Chief Complaint: Arm pain  HPI  Patient is a 25 y.o. who presets to the ED with complaints of LUE Pain & blood clot in her mouth. Patient has left lower molar dental extraction performed today. She thinks there were problems with her LUE IV- having pain to the site, worse with movement. Also had a blood clot in her mouth at extraction site. D    Review of Systems  Positive: Myalgia, dental problem Negative: Numbness, weakness, fever  Physical Exam  BP 114/87 (BP Location: Right Arm)   Pulse 87   Temp 98.7 F (37.1 C) (Oral)   Resp 18   SpO2 95%    Gen:   Awake, no distress   HEENT:  Atraumatic. Gauze in place to bilateral molar region, no active bleeding noted at this time. Tolerating own secretions without difficulty.  Resp:  Normal effort  Cardiac:  Normal rate  Abd:   Nondistended, nontender  MSK:   LUE: Small amount of bruising to the left AC. NO erythema/warmth/drainage. Able to actively range the elbow some. Tender to [facility]. Neuro:  Speech clear. Sensation grossly intact to UEs, able to squeeze my hand bilaterally, can perform OK sign, thumbs up, and cross 2nd/3rd digits bilaterally.   Medical Decision Making   Initiation of care has begun. The patient has been counseled on the process, plan, and necessity for staying for the completion/evaluation, informed that the remainder of the evaluation will be completed by another provider, this initial triage assessment does not replace that evaluation, and the importance of remaining in the ED until their evaluation is complete.    Clinical Impression  Arm pain.         Desmond Lope 10/18/20 2254    Alvira Monday, MD 10/19/20 [phone]

## 2020-10-18 NOTE — ED Triage Notes (Signed)
Patient here after having an IV at dentist office.  Patient states that her left arm hurts after having the IV, states that it could have been placed wrong.  Also has stitches and a blood clot in mouth.

## 2020-10-19 NOTE — Discharge Instructions (Addendum)
You were evaluated in the Emergency Department and after careful evaluation, we did not find any emergent condition requiring admission or further testing in the hospital.  Your exam/testing today was overall reassuring.  Recommend Tylenol or Motrin at home for discomfort.  Recommend warm compresses on the arm to help it heal.  Please return to the Emergency Department if you experience any worsening of your condition.  Thank you for allowing [place] to be a part of your care.

## 2020-10-19 NOTE — ED Provider Notes (Signed)
[facility] Emergency Department Provider Note MRN:  [id number]  Arrival date & time: 10/19/20     Chief Complaint   Arm Pain   History of Present Illness   Rhonda Evans is a 25 y.o. year-old female with a past medical history presenting to the ED with chief complaint of arm pain.  Patient had a dental extraction procedure earlier today, felt like the IV that was placed in her left arm was difficult and excessively painful.  Felt like the nurse that did the IV was acting like something went wrong.  Also concerned about the blood clot that she noticed in the back of her mouth where the tooth used to be symptoms are mild, constant.  No fever.  No active bleeding.  Review of Systems  A complete 10 system review of systems was obtained and all systems are negative except as noted in the HPI and PMH.   Patient's Health History    Past Medical History:  Diagnosis Date  . Medical history non-contributory     Past Surgical History:  Procedure Laterality Date  . NO PAST SURGERIES      Family History  Problem Relation Age of Onset  . Diabetes Maternal Grandfather   . Hypertension Maternal Grandfather   . Cancer Maternal Grandfather   . Cancer Paternal Grandfather   . Hypertension Maternal Aunt   . Diabetes Maternal Grandmother        great  . Hypertension Maternal Grandmother        great  . Hearing loss Neg Hx     Social History   Socioeconomic History  . Marital status: Single    Spouse name: Not on file  . Number of children: Not on file  . Years of education: Not on file  . Highest education level: Not on file  Occupational History  . Not on file  Tobacco Use  . Smoking status: Never Smoker  . Smokeless tobacco: Never Used  Vaping Use  . Vaping Use: Never used  Substance and Sexual Activity  . Alcohol use: No  . Drug use: No  . Sexual activity: Yes    Partners: Male    Birth control/protection: None  Other Topics Concern  . Not on  file  Social History Narrative  . Not on file   Social Determinants of Health   Financial Resource Strain: Not on file  Food Insecurity: Not on file  Transportation Needs: Not on file  Physical Activity: Not on file  Stress: Not on file  Social Connections: Not on file  Intimate Partner Violence: Not on file     Physical Exam   Vitals:   10/18/20 2253 10/19/20 0258  BP: 114/87 116/73  Pulse: 87 79  Resp: 18 18  Temp: 98.7 F (37.1 C)   SpO2: 95% 100%    CONSTITUTIONAL: Well-appearing, NAD NEURO:  Alert and oriented x 3, no focal deficits EYES:  eyes equal and reactive ENT/NECK:  no LAD, no JVD; small blood clot noted at former location of left mandibular molar CARDIO: Regular rate, well-perfused, normal S1 and S2 PULM:  CTAB no wheezing or rhonchi GI/GU:  normal bowel sounds, non-distended, non-tender MSK/SPINE:  No gross deformities, no edema SKIN: Well-healing puncture site to the left antecubital fossa with mild amount of bruising PSYCH:  Appropriate speech and behavior  *Additional and/or pertinent findings included in MDM below  Diagnostic and Interventional Summary    EKG Interpretation  Date/Time:    Ventricular Rate:  PR Interval:    QRS Duration:   QT Interval:    QTC Calculation:   R Axis:     Text Interpretation:        Labs Reviewed - No data to display  No orders to display    Medications - No data to display   Procedures  /  Critical Care Procedures  ED Course and Medical Decision Making  I have reviewed the triage vital signs, the nursing notes, and pertinent available records from the EMR.  Listed above are laboratory and imaging tests that I personally ordered, reviewed, and interpreted and then considered in my medical decision making (see below for details).  Patient overall requires reassurance for this traumatic IV experience.  There is no evidence of infection, there is no evidence of retained foreign body, there is no  significant swelling to suggest DVT.  Appropriate for anti-inflammatories and warm compresses.  The oral exam is also reassuring, there is an appropriate small blood clot where tooth used to be, no active bleeding.  Appropriate for discharge.       Elmer Sow. Pilar Plate, MD [facility] Emergency Medicine [facility] [email or web address]  Final Clinical Impressions(s) / ED Diagnoses     ICD-10-CM   1. Status post tooth extraction  K08.409   2. Hematoma of intravenous catheter site, initial encounter [facility])  Q76.195K     ED Discharge Orders    None       Discharge Instructions Discussed with and Provided to Patient:     Discharge Instructions     You were evaluated in the Emergency Department and after careful evaluation, we did not find any emergent condition requiring admission or further testing in the hospital.  Your exam/testing today was overall reassuring.  Recommend Tylenol or Motrin at home for discomfort.  Recommend warm compresses on the arm to help it heal.  Please return to the Emergency Department if you experience any worsening of your condition.  Thank you for allowing [place] to be a part of your care.       Sabas Sous, MD 10/19/20 [phone]

## 2020-10-27 ENCOUNTER — Encounter (HOSPITAL_COMMUNITY): Payer: Self-pay

## 2020-10-27 ENCOUNTER — Ambulatory Visit (HOSPITAL_COMMUNITY)
Admission: EM | Admit: 2020-10-27 | Discharge: 2020-10-27 | Disposition: A | Discharge status: Home / Self Care | Payer: Medicaid Other | Attending: Internal Medicine | Admitting: Internal Medicine

## 2020-10-27 ENCOUNTER — Other Ambulatory Visit: Payer: Self-pay

## 2020-10-27 DIAGNOSIS — N898 Other specified noninflammatory disorders of vagina: Secondary | ICD-10-CM | POA: Insufficient documentation

## 2020-10-27 DIAGNOSIS — J101 Influenza due to other identified influenza virus with other respiratory manifestations: Secondary | ICD-10-CM | POA: Insufficient documentation

## 2020-10-27 LAB — POC INFLUENZA A AND B ANTIGEN (URGENT CARE ONLY)
INFLUENZA A ANTIGEN, POC: POSITIVE — AB
INFLUENZA B ANTIGEN, POC: NEGATIVE

## 2020-10-27 MED ORDER — OSELTAMIVIR PHOSPHATE 75 MG PO CAPS: 75.0000 mg | ORAL_CAPSULE | Freq: Two times a day (BID) | ORAL | 0 refills | Status: DC

## 2020-10-27 MED ORDER — IBUPROFEN 600 MG PO TABS
600.0000 mg | ORAL_TABLET | Freq: Four times a day (QID) | ORAL | 0 refills | Status: DC | PRN
Start: 2020-10-27 — End: 2021-07-04

## 2020-10-27 NOTE — Discharge Instructions (Addendum)
Increase oral fluid intake Tessalon Perles as needed for cough Ibuprofen as needed for body aches and/or fever We will call you with recommendations if labs are abnormal Safe sex practices recommended.

## 2020-10-27 NOTE — ED Triage Notes (Addendum)
Pt reports cough which causes headache, eye pain, body aches, chills and loss of appetite. States coughing makes head and eyes hurt more.  Pt also reports brown/yellow malodorous vaginal discharge and would like to be tested for STDs.

## 2020-10-28 ENCOUNTER — Telehealth (HOSPITAL_COMMUNITY): Payer: Self-pay | Admitting: Emergency Medicine

## 2020-10-28 LAB — CERVICOVAGINAL ANCILLARY ONLY
Bacterial Vaginitis (gardnerella): POSITIVE — AB
Candida Glabrata: NEGATIVE
Candida Vaginitis: NEGATIVE
Chlamydia: POSITIVE — AB
Comment: NEGATIVE
Comment: NEGATIVE
Comment: NEGATIVE
Comment: NEGATIVE
Comment: NEGATIVE
Comment: NORMAL
Neisseria Gonorrhea: NEGATIVE
Trichomonas: NEGATIVE

## 2020-10-28 MED ORDER — METRONIDAZOLE 0.75 % VA GEL: 1.0000 | Freq: Every day | VAGINAL | 0 refills | Status: AC

## 2020-10-28 MED ORDER — DOXYCYCLINE HYCLATE 100 MG PO CAPS: 100.0000 mg | ORAL_CAPSULE | Freq: Two times a day (BID) | ORAL | 0 refills | Status: AC

## 2020-10-30 NOTE — ED Provider Notes (Signed)
[facility]    CSN: [id number] Arrival date & time: 10/27/20  1350      History   Chief Complaint Chief Complaint  Patient presents with  . Cough  . Chills  . Headache  . Generalized Body Aches  . Vaginal Discharge    HPI Rhonda Evans is a 25 y.o. female comes to the urgent care with 1 day history of headache, eye pain, generalized body aches, fever, chills and loss of appetite.  Symptoms started abruptly yesterday and has been persistent.  She started having a nonproductive cough.  No shortness of breath or wheezing.  No sick contacts.  Patient also complains of trouble with yellowish brown malodorous vaginal discharge.  No dysuria urgency or frequency.  Patient is sexually active and engages in unprotected sexual intercourse.  Patient would like to be tested for sexually transmitted diseases. HPI  Past Medical History:  Diagnosis Date  . Medical history non-contributory     There are no problems to display for this patient.   Past Surgical History:  Procedure Laterality Date  . NO PAST SURGERIES      OB History    Gravida  1   Para  1   Term  1   Preterm      AB      Living  1     SAB      IAB      Ectopic      Multiple      Live Births               Home Medications    Prior to Admission medications   Medication Sig Start Date End Date Taking? Authorizing Provider  ibuprofen (ADVIL) 600 MG tablet Take 1 tablet (600 mg total) by mouth every 6 (six) hours as needed. 10/27/20  Yes Raetta Agostinelli, Britta Mccreedy, MD  oseltamivir (TAMIFLU) 75 MG capsule Take 1 capsule (75 mg total) by mouth every 12 (twelve) hours. 10/27/20  Yes Stella Bortle, Britta Mccreedy, MD  doxycycline (VIBRAMYCIN) 100 MG capsule Take 1 capsule (100 mg total) by mouth 2 (two) times daily for 7 days. 10/28/20 11/04/20  Merrilee Jansky, MD  metroNIDAZOLE (METROGEL VAGINAL) 0.75 % vaginal gel Place 1 Applicatorful vaginally at bedtime for 5 days. 10/28/20 11/02/20  Merrilee Jansky, MD   pantoprazole (PROTONIX) 20 MG tablet Take 1 tablet (20 mg total) by mouth daily. 07/23/20 08/22/20  Sponseller, Lupe Carney R, PA-C  escitalopram (LEXAPRO) 20 MG tablet Take 1 tablet (20 mg total) by mouth daily. Patient not taking: Reported on 06/01/2019 12/04/18 10/27/20  Arfeen, Phillips Grout, MD  norethindrone-ethinyl estradiol (NECON 0.5/35, 28,) 0.5-35 MG-MCG tablet Take 1 tablet by mouth daily. Patient not taking: Reported on 06/01/2019 10/08/18 10/27/20  Gerrit Heck, CNM    Family History Family History  Problem Relation Age of Onset  . Diabetes Maternal Grandfather   . Hypertension Maternal Grandfather   . Cancer Maternal Grandfather   . Cancer Paternal Grandfather   . Hypertension Maternal Aunt   . Diabetes Maternal Grandmother        great  . Hypertension Maternal Grandmother        great  . Hearing loss Neg Hx     Social History Social History   Tobacco Use  . Smoking status: Never Smoker  . Smokeless tobacco: Never Used  Vaping Use  . Vaping Use: Never used  Substance Use Topics  . Alcohol use: No  . Drug use: No  Allergies   Amoxicillin and Latex   Review of Systems Review of Systems  Constitutional: Positive for chills and fever.  HENT: Positive for congestion and sore throat.   Respiratory: Positive for cough. Negative for shortness of breath and wheezing.   Genitourinary: Positive for vaginal discharge. Negative for dysuria, frequency and urgency.  Musculoskeletal: Positive for myalgias.  Neurological: Positive for headaches.     Physical Exam Triage Vital Signs ED Triage Vitals  Enc Vitals Group     BP 10/27/20 1416 103/66     Pulse Rate 10/27/20 1414 (!) 125     Resp 10/27/20 1414 18     Temp 10/27/20 1414 100.1 F (37.8 C)     Temp src --      SpO2 10/27/20 1414 96 %     Weight --      Height --      Head Circumference --      Peak Flow --      Pain Score 10/27/20 1411 8     Pain Loc --      Pain Edu? --      Excl. in GC? --    No data  found.  Updated Vital Signs BP 103/66   Pulse (!) 127   Temp 100.1 F (37.8 C)   Resp 18   LMP 10/22/2020 Comment: pt not sure of exact date but states was very recent  SpO2 95%   Visual Acuity Right Eye Distance:   Left Eye Distance:   Bilateral Distance:    Right Eye Near:   Left Eye Near:    Bilateral Near:     Physical Exam Vitals and nursing note reviewed.  Constitutional:      Appearance: She is ill-appearing.  Eyes:     Extraocular Movements: Extraocular movements intact.     Pupils: Pupils are equal, round, and reactive to light.  Cardiovascular:     Rate and Rhythm: Normal rate and regular rhythm.  Pulmonary:     Effort: Pulmonary effort is normal.  Abdominal:     General: Bowel sounds are normal.     Palpations: Abdomen is soft.  Musculoskeletal:     Cervical back: Normal range of motion and neck supple.  Neurological:     Mental Status: She is alert.     GCS: GCS eye subscore is 4. GCS verbal subscore is 5. GCS motor subscore is 6.      UC Treatments / Results  Labs (all labs ordered are listed, but only abnormal results are displayed) Labs Reviewed  POC INFLUENZA A AND B ANTIGEN (URGENT CARE ONLY) - Abnormal; Notable for the following components:      Result Value   INFLUENZA A ANTIGEN, POC POSITIVE (*)    All other components within normal limits  CERVICOVAGINAL ANCILLARY ONLY - Abnormal; Notable for the following components:   Chlamydia Positive (*)    Bacterial Vaginitis (gardnerella) Positive (*)    All other components within normal limits    EKG   Radiology No results found.  Procedures Procedures (including critical care time)  Medications Ordered in UC Medications - No data to display  Initial Impression / Assessment and Plan / UC Course  I have reviewed the triage vital signs and the nursing notes.  Pertinent labs & imaging results that were available during my care of the patient were reviewed by me and considered in my  medical decision making (see chart for details).     1.  Influenza  A infection: Point-of-care flu antigen test was positive for influenza A Tamiflu 75 mg twice daily for 5 days Ibuprofen 600 mg every 6 hours as needed for pain and/or fever Increase oral fluid intake Return precautions given  2.  Acute vaginitis Cervicovaginal swab for GC/chlamydia/trichomonas We will call patient with recommendations if labs are abnormal Return precautions given Safe sex practices advised. Abstain from sexual intercourse until test results are available. Final Clinical Impressions(s) / UC Diagnoses   Final diagnoses:  Vaginal discharge  Influenza A     Discharge Instructions     Increase oral fluid intake Tessalon Perles as needed for cough Ibuprofen as needed for body aches and/or fever We will call you with recommendations if labs are abnormal Safe sex practices recommended.   ED Prescriptions    Medication Sig Dispense Auth. Provider   oseltamivir (TAMIFLU) 75 MG capsule Take 1 capsule (75 mg total) by mouth every 12 (twelve) hours. 10 capsule Francys Bolin, Britta Mccreedy, MD   ibuprofen (ADVIL) 600 MG tablet Take 1 tablet (600 mg total) by mouth every 6 (six) hours as needed. 30 tablet Zian Delair, Britta Mccreedy, MD     PDMP not reviewed this encounter.   Merrilee Jansky, MD 10/30/20 [phone]

## 2021-02-02 ENCOUNTER — Ambulatory Visit (HOSPITAL_COMMUNITY)
Admission: EM | Admit: 2021-02-02 | Discharge: 2021-02-02 | Disposition: A | Discharge status: Home / Self Care | Payer: Medicaid Other | Attending: Emergency Medicine | Admitting: Emergency Medicine

## 2021-02-02 ENCOUNTER — Other Ambulatory Visit: Payer: Self-pay

## 2021-02-02 ENCOUNTER — Encounter (HOSPITAL_COMMUNITY): Payer: Self-pay

## 2021-02-02 DIAGNOSIS — R110 Nausea: Secondary | ICD-10-CM | POA: Diagnosis not present

## 2021-02-02 DIAGNOSIS — L509 Urticaria, unspecified: Secondary | ICD-10-CM

## 2021-02-02 LAB — POC URINE PREG, ED: Preg Test, Ur: POSITIVE — AB

## 2021-02-02 NOTE — Discharge Instructions (Addendum)
Your urine pregnancy test today was positive, based on when you stated  your last period was you may be approximately 9 -10 weeks however you will need a ultrasound to confirm how far along you are  You can make an appointment at the [facility] information was below, or any obstetrician office or the local health department  There is information inside of your packet and what medications are safe to use while you are pregnant as well as information on what to expect during the first trimester  For your hives you can use over-the-counter Benadryl as needed, be mindful this medication may make you drowsy or an over-the-counter Benadryl cream and apply directly to the affected areas

## 2021-02-02 NOTE — ED Triage Notes (Signed)
Pt presents with nausea x 5 days. States she has a missed cycle and states she is concerned of food poisoning. States she has been getting mosquito bites that cause her to break into hives.

## 2021-02-02 NOTE — ED Provider Notes (Signed)
[facility]    CSN: [id number] Arrival date & time: 02/02/21  1157      History   Chief Complaint Chief Complaint  Patient presents with   Nausea    HPI Rhonda Evans is a 25 y.o. female.   Patient presents with nausea vomiting for 5 days.  Endorses a pulling sensation in the abdomen intermittently and urinary frequency and urgency as well.  Denies fevers, chills, body aches, diarrhea, constipation, URI symptoms, vaginal discharge, vaginal itching, vaginal odor, vaginal irritation.  Last menstrual period approximately around November 27, 2020.  Sexually active with 1 partner, no condom use, not on birth control.  Believes symptoms may be related to food poisoning, no other member of household sick  Concern with hives on her right foot that occurred with mosquito bite.  Rash is pruritic with no drainage.  Denies fevers, chills, body aches, changes in diet, soaps, lotions, detergents, linens.  Has rubbed alcohol over the area with some relief.  Past Medical History:  Diagnosis Date   Medical history non-contributory     There are no problems to display for this patient.   Past Surgical History:  Procedure Laterality Date   NO PAST SURGERIES      OB History     Gravida  1   Para  1   Term  1   Preterm      AB      Living  1      SAB      IAB      Ectopic      Multiple      Live Births               Home Medications    Prior to Admission medications   Medication Sig Start Date End Date Taking? Authorizing Provider  ibuprofen (ADVIL) 600 MG tablet Take 1 tablet (600 mg total) by mouth every 6 (six) hours as needed. 10/27/20   LampteyBritta Mccreedy, MD  oseltamivir (TAMIFLU) 75 MG capsule Take 1 capsule (75 mg total) by mouth every 12 (twelve) hours. 10/27/20   Merrilee Jansky, MD  pantoprazole (PROTONIX) 20 MG tablet Take 1 tablet (20 mg total) by mouth daily. 07/23/20 08/22/20  Sponseller, Lupe Carney R, PA-C  escitalopram (LEXAPRO) 20 MG tablet Take 1  tablet (20 mg total) by mouth daily. Patient not taking: Reported on 06/01/2019 12/04/18 10/27/20  Arfeen, Phillips Grout, MD  norethindrone-ethinyl estradiol (NECON 0.5/35, 28,) 0.5-35 MG-MCG tablet Take 1 tablet by mouth daily. Patient not taking: Reported on 06/01/2019 10/08/18 10/27/20  Gerrit Heck, CNM    Family History Family History  Problem Relation Age of Onset   Diabetes Maternal Grandfather    Hypertension Maternal Grandfather    Cancer Maternal Grandfather    Cancer Paternal Grandfather    Hypertension Maternal Aunt    Diabetes Maternal Grandmother        great   Hypertension Maternal Grandmother        great   Hearing loss Neg Hx     Social History Social History   Tobacco Use   Smoking status: Never   Smokeless tobacco: Never  Vaping Use   Vaping Use: Never used  Substance Use Topics   Alcohol use: No   Drug use: No     Allergies   Amoxicillin and Latex   Review of Systems Review of Systems Defer to HPI   Physical Exam Triage Vital Signs ED Triage Vitals  Enc Vitals Group  BP 02/02/21 1229 117/74     Pulse Rate 02/02/21 1227 76     Resp --      Temp 02/02/21 1229 98.5 F (36.9 C)     Temp Source 02/02/21 1229 Oral     SpO2 02/02/21 1227 100 %     Weight --      Height --      Head Circumference --      Peak Flow --      Pain Score 02/02/21 1227 0     Pain Loc --      Pain Edu? --      Excl. in GC? --    No data found.  Updated Vital Signs BP 117/74 (BP Location: Right Arm)   Pulse 76   Temp 98.5 F (36.9 C) (Oral)   LMP 11/27/2020 (Exact Date)   SpO2 100%   Visual Acuity Right Eye Distance:   Left Eye Distance:   Bilateral Distance:    Right Eye Near:   Left Eye Near:    Bilateral Near:     Physical Exam Constitutional:      Appearance: Normal appearance. She is normal weight.  HENT:     Head: Normocephalic.  Eyes:     Extraocular Movements: Extraocular movements intact.  Pulmonary:     Effort: Pulmonary effort is normal.      Breath sounds: Normal breath sounds.  Abdominal:     General: Abdomen is flat. There is no distension.     Palpations: Abdomen is soft.     Tenderness: There is no abdominal tenderness. There is no right CVA tenderness or left CVA tenderness.  Skin:    Comments: One quarter sized papular lesion over the right foot, mild erythema and swelling, no tenderness noted  Neurological:     Mental Status: She is alert and oriented to person, place, and time. Mental status is at baseline.  Psychiatric:        Mood and Affect: Mood normal.        Behavior: Behavior normal.     UC Treatments / Results  Labs (all labs ordered are listed, but only abnormal results are displayed) Labs Reviewed  POC URINE PREG, ED - Abnormal; Notable for the following components:      Result Value   Preg Test, Ur POSITIVE (*)    All other components within normal limits    EKG   Radiology No results found.  Procedures Procedures (including critical care time)  Medications Ordered in UC Medications - No data to display  Initial Impression / Assessment and Plan / UC Course  I have reviewed the triage vital signs and the nursing notes.  Pertinent labs & imaging results that were available during my care of the patient were reviewed by me and considered in my medical decision making (see chart for details).  Clinical Course as of 02/02/21 1312  Thu Feb 02, 2021  1249 POC urine pregnancy(!) [AW]    Clinical Course User Index [AW] Valinda Hoar, NP    Hives Nausea without vomiting  1.  Urine pregnancy positive, given resources to establish care with obstetrician, given resources about safe medication use as well as for trimester expectations, patient tearful when notified of lab results 2.  Declined prescription nausea medication. 3.  Advised over-the-counter  oral or topical Benadryl for management of hives Final Clinical Impressions(s) / UC Diagnoses   Final diagnoses:  Hives  Nausea  without vomiting     Discharge  Instructions      Your urine pregnancy test today was positive, based on when you stated  your last period was you may be approximately 9 -10 weeks however you will need a ultrasound to confirm how far along you are  You can make an appointment at the [facility] information was below, or any obstetrician office or the local health department  There is information inside of your packet and what medications are safe to use while you are pregnant as well as information on what to expect during the first trimester  For your hives you can use over-the-counter Benadryl as needed, be mindful this medication may make you drowsy or an over-the-counter Benadryl cream and apply directly to the affected areas   ED Prescriptions   None    PDMP not reviewed this encounter.   Valinda Hoar, NP 02/02/21 1319

## 2021-02-09 ENCOUNTER — Telehealth (INDEPENDENT_AMBULATORY_CARE_PROVIDER_SITE_OTHER): Payer: Medicaid Other | Admitting: *Deleted

## 2021-02-09 ENCOUNTER — Telehealth: Payer: Medicaid Other

## 2021-02-09 DIAGNOSIS — Z3490 Encounter for supervision of normal pregnancy, unspecified, unspecified trimester: Secondary | ICD-10-CM | POA: Insufficient documentation

## 2021-02-09 NOTE — Progress Notes (Signed)
9:15 Maud not connected virtually for her virtual visit. I called and heard a message " voicemail box is full" . Unable to leave a message.  Emoni Yang,RN 9:28 Tishanna not connected virtually for her virtual visit. I called and heard a message" voicemail is full " again.  I called her contact Adela Lank and left a message with her to let Hellen know that I was calling about her appointment and to call us back.  Shabreka Coulon,RN

## 2021-02-23 ENCOUNTER — Other Ambulatory Visit: Payer: Self-pay

## 2021-02-23 ENCOUNTER — Other Ambulatory Visit (HOSPITAL_COMMUNITY)
Admission: RE | Admit: 2021-02-23 | Discharge: 2021-02-23 | Disposition: A | Discharge status: Home / Self Care | Payer: Medicaid Other | Source: Ambulatory Visit | Attending: Family Medicine | Admitting: Family Medicine

## 2021-02-23 ENCOUNTER — Other Ambulatory Visit: Payer: Self-pay | Admitting: Obstetrics & Gynecology

## 2021-02-23 ENCOUNTER — Ambulatory Visit (INDEPENDENT_AMBULATORY_CARE_PROVIDER_SITE_OTHER): Payer: Medicaid Other | Admitting: Family Medicine

## 2021-02-23 ENCOUNTER — Encounter: Payer: Self-pay | Admitting: Family Medicine

## 2021-02-23 VITALS — BP 121/80 | HR 109

## 2021-02-23 DIAGNOSIS — Z3490 Encounter for supervision of normal pregnancy, unspecified, unspecified trimester: Secondary | ICD-10-CM | POA: Insufficient documentation

## 2021-02-23 DIAGNOSIS — O219 Vomiting of pregnancy, unspecified: Secondary | ICD-10-CM

## 2021-02-23 DIAGNOSIS — D582 Other hemoglobinopathies: Secondary | ICD-10-CM | POA: Diagnosis not present

## 2021-02-23 MED ORDER — DOXYLAMINE-PYRIDOXINE 10-10 MG PO TBEC: DELAYED_RELEASE_TABLET | ORAL | 1 refills | Status: DC

## 2021-02-23 MED ORDER — DOXYLAMINE-PYRIDOXINE 10-10 MG PO TBEC
DELAYED_RELEASE_TABLET | ORAL | 1 refills | Status: DC
Start: 2021-02-23 — End: 2021-02-23

## 2021-02-23 MED ORDER — PREPLUS 27-1 MG PO TABS
1.0000 | ORAL_TABLET | Freq: Every day | ORAL | 11 refills | Status: DC
Start: 2021-02-23 — End: 2021-02-23

## 2021-02-23 MED ORDER — PROMETHAZINE HCL 25 MG PO TABS
25.0000 mg | ORAL_TABLET | Freq: Four times a day (QID) | ORAL | 1 refills | Status: DC | PRN
Start: 2021-02-23 — End: 2021-07-20

## 2021-02-23 MED ORDER — BLOOD PRESSURE KIT DEVI: 1.0000 | 0 refills | Status: DC | PRN

## 2021-02-23 MED ORDER — PRENATAL 27-1 MG PO TABS
1.0000 | ORAL_TABLET | Freq: Every day | ORAL | 12 refills | Status: DC
Start: 2021-02-23 — End: 2023-02-13

## 2021-02-23 MED ORDER — GOJJI WEIGHT SCALE MISC: 1.0000 | 0 refills | Status: DC | PRN

## 2021-02-23 NOTE — Progress Notes (Signed)
History:   Rhonda Evans is a 25 y.o. G2P1001 at 37w1dby LMP, best clinical estimate being seen today for her first obstetrical visit.  She is unsure of her LMP. Her obstetrical history is significant for uncomplicated vaginal delivery in 2016. Patient  does  intend to breast feed. Pregnancy history fully reviewed (significant for teenage pregnancy and inadequate prenatal care, otherwise unremarkable).  Patient reports nausea and occasional vomiting with eating. Can keep fluids down. Hasn't tried anything yet.   FOB has sickle cell trait and patient believes she has sickle cell trait as well.       HISTORY: OB History  Gravida Para Term Preterm AB Living  2 1 1  0 0 1  SAB IAB Ectopic Multiple Live Births  0 0 0 0 1    # Outcome Date GA Lbr Len/2nd Weight Sex Delivery Anes PTL Lv  2 Current           1 Term 07/06/14 [id number] lb 11 oz (3.941 kg)  Vag-Spont  N LIV    Past Medical History:  Diagnosis Date   Anxiety    Depression    Medical history non-contributory    Past Surgical History:  Procedure Laterality Date   NO PAST SURGERIES     Family History  Problem Relation Age of Onset   Ovarian cancer Mother    Ovarian cancer Sister    Hypertension Maternal Aunt    Diabetes Maternal Grandmother        great   Hypertension Maternal Grandmother        great   Diabetes Maternal Grandfather    Hypertension Maternal Grandfather    Cancer Maternal Grandfather    Cancer Paternal Grandfather    Hearing loss Neg Hx    Social History   Tobacco Use   Smoking status: Never   Smokeless tobacco: Never  Vaping Use   Vaping Use: Former  Substance Use Topics   Alcohol use: No   Drug use: No   Allergies  Allergen Reactions   Amoxicillin Anaphylaxis and Rash    Has patient had a PCN reaction causing immediate rash, facial/tongue/throat swelling, SOB or lightheadedness with hypotension: Yes Has patient had a PCN reaction causing severe rash involving mucus membranes or skin  necrosis: No Has patient had a PCN reaction that required hospitalization Yes Has patient had a PCN reaction occurring within the last 10 years: No If all of the above answers are "NO", then may proceed with Cephalosporin use.    Latex Rash   Current Outpatient Medications on File Prior to Visit  Medication Sig Dispense Refill   ibuprofen (ADVIL) 600 MG tablet Take 1 tablet (600 mg total) by mouth every 6 (six) hours as needed. (Patient not taking: Reported on 02/23/2021) 30 tablet 0   oseltamivir (TAMIFLU) 75 MG capsule Take 1 capsule (75 mg total) by mouth every 12 (twelve) hours. (Patient not taking: Reported on 02/23/2021) 10 capsule 0   pantoprazole (PROTONIX) 20 MG tablet Take 1 tablet (20 mg total) by mouth daily. 30 tablet 0   [DISCONTINUED] escitalopram (LEXAPRO) 20 MG tablet Take 1 tablet (20 mg total) by mouth daily. (Patient not taking: Reported on 06/01/2019) 30 tablet 1   [DISCONTINUED] norethindrone-ethinyl estradiol (NECON 0.5/35, 28,) 0.5-35 MG-MCG tablet Take 1 tablet by mouth daily. (Patient not taking: Reported on 06/01/2019) 1 Package 11   No current facility-administered medications on file prior to visit.   Indications for ASA therapy (per uptodate) One  of the following: Previous pregnancy with preeclampsia, especially early onset and with an adverse outcome No Multifetal gestation No Chronic hypertension No Type 1 or 2 diabetes mellitus No Chronic kidney disease No Autoimmune disease (antiphospholipid syndrome, systemic lupus erythematosus) No  Two or more of the following: Nulliparity No Obesity (body mass index >30 kg/m2) No Family history of preeclampsia in mother or sister No Age ?35 years No Sociodemographic characteristics (African American race, low socioeconomic level) Yes Personal risk factors (eg, previous pregnancy with low birth weight or small for gestational age infant, previous adverse pregnancy outcome [eg, stillbirth], interval >10 years between  pregnancies) No  Review of Systems Pertinent items noted in HPI and remainder of comprehensive ROS otherwise negative. Physical Exam:   Vitals:   02/23/21 1539 02/23/21 1614  BP: (!) 121/110 121/80  Pulse: (!) 109    Fetal Heart Rate (bpm): 153  Constitutional: Well-developed, well-nourished pregnant female in no acute distress.  HEENT: PERRLA Skin: normal color and turgor, no rash Cardiovascular: normal rate & rhythm Respiratory: normal effort GI: Abd soft, non-tender MS: Normal gait  Neurologic: Alert and oriented x 4.  Pelvic: deferred   Assessment:    Pregnancy: G2P1001 Patient Active Problem List   Diagnosis Date Noted   Supervision of low-risk pregnancy 02/09/2021      Plan:    1. Encounter for supervision of low-risk pregnancy, antepartum Unclear dating, obtaining US. Will need papsmear on f/u visit or confirm it has been completed elsewhere previously.  - US OB LESS THAN 14 WEEKS WITH OB TRANSVAGINAL; Future - CBC/D/Plt+RPR+Rh+ABO+RubIgG... - Culture, OB Urine - CH[facility] AMB BABYSCRIPTS SCHEDULE OPTIMIZATION - Misc. Devices (GOJJI WEIGHT SCALE) MISC; 1 Device by Does not apply route as needed.  Dispense: 1 each; Refill: 0 - Blood Pressure Monitoring (BLOOD PRESSURE KIT) DEVI; 1 Device by Does not apply route as needed.  Dispense: 1 each; Refill: 0 - Ambulatory referral to [place] - Prenatal Vit-Fe Fumarate-FA (PREPLUS) 27-1 MG TABS; Take 1 tablet by mouth daily.  Dispense: 30 tablet; Refill: 11 - Hemoglobin A1c - Comprehensive metabolic panel - Protein / creatinine ratio, urine - Cervicovaginal ancillary only( [place])  2. Nausea and vomiting of early pregnancy Discussed small frequent meals, ginger, sea bands. Rx'd diclegis.   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 Offered genetic counseling for patient/FOB hemoglobin traits, she  declines currently.   Initial labs drawn. Continue prenatal vitamins. Problem list reviewed and updated. Genetic Screening discussed, she would like to do panorama/horizon, however will need to clarify dating first as she is likely <[redacted] weeks gestation.  Ultrasound discussed; dating [place]: ordered. Anticipatory guidance about prenatal visits given including labs, ultrasounds, and testing. Discussed usage of Babyscripts and virtual visits as additional source of managing and completing prenatal visits in midst of coronavirus and pandemic.   Encouraged to complete MyChart Registration for her ability to review results, send requests, and have questions addressed.  The nature of [place] [facility]ulty Practice with multiple MDs and Advanced Practice Providers was explained to patient; also emphasized that residents, students are part of our team. Routine obstetric precautions reviewed. Encouraged to seek out care at office or emergency room [facility] preferred) for urgent and/or emergent concerns.  Return in about 4 weeks (around 03/23/2021) for LROB.    Darrelyn Hillock, DO Ob Fellow   02/27/2021  4:04 PM

## 2021-02-23 NOTE — Progress Notes (Signed)
     Faculty Practice OB/GYN Physician Phone Call Documentation  I had a phone conversation with RN after hours line about Rhonda Evans about her prescriptions sent in today, during her visit. Not received by pharmacy. The prenatal vitamins and Diclegis were resent. Patient also reports increased nausea, I also added Phenergan as needed. She was told to call back for worsening or concerning symptoms.     Jaynie Collins, MD, FACOG Obstetrician & Gynecologist, [facility] for [employer], [facility]

## 2021-02-23 NOTE — Progress Notes (Signed)
Informal bedside ultrasound performed to assess FHR, FHR 152bpm- fetus appears to be around 8-9 weeks

## 2021-02-23 NOTE — Patient Instructions (Signed)
You should be able to schedule a lab visit for your genetic testing after your ultrasound. You have to be past [redacted] weeks gestation to have it done.   Please consider genetic counseling for sickle cell.

## 2021-02-24 LAB — HEMOGLOBIN A1C
Est. average glucose Bld gHb Est-mCnc: 100 mg/dL
Hgb A1c MFr Bld: 5.1 % (ref 4.8–5.6)

## 2021-02-24 LAB — CERVICOVAGINAL ANCILLARY ONLY
Bacterial Vaginitis (gardnerella): POSITIVE — AB
Candida Glabrata: NEGATIVE
Candida Vaginitis: NEGATIVE
Chlamydia: NEGATIVE
Comment: NEGATIVE
Comment: NEGATIVE
Comment: NEGATIVE
Comment: NEGATIVE
Comment: NEGATIVE
Comment: NORMAL
Neisseria Gonorrhea: NEGATIVE
Trichomonas: NEGATIVE

## 2021-02-24 LAB — CBC/D/PLT+RPR+RH+ABO+RUBIGG...
Antibody Screen: NEGATIVE
Basophils Absolute: 0 10*3/uL (ref 0.0–0.2)
Basos: 1 %
EOS (ABSOLUTE): 0.1 10*3/uL (ref 0.0–0.4)
Eos: 2 %
HCV Ab: <0.1 s/co ratio (ref 0.0–0.9)
HIV Screen 4th Generation wRfx: NONREACTIVE
Hematocrit: 36 % (ref 34.0–46.6)
Hemoglobin: 12 g/dL (ref 11.1–15.9)
Hepatitis B Surface Ag: NEGATIVE
Immature Grans (Abs): 0 10*3/uL (ref 0.0–0.1)
Immature Granulocytes: 0 %
Lymphocytes Absolute: 2.7 10*3/uL (ref 0.7–3.1)
Lymphs: 46 %
MCH: 25.3 pg — ABNORMAL LOW (ref 26.6–33.0)
MCHC: 33.3 g/dL (ref 31.5–35.7)
MCV: 76 fL — ABNORMAL LOW (ref 79–97)
Monocytes Absolute: 0.6 10*3/uL (ref 0.1–0.9)
Monocytes: 9 %
Neutrophils Absolute: 2.5 10*3/uL (ref 1.4–7.0)
Neutrophils: 42 %
Platelets: 422 10*3/uL (ref 150–450)
RBC: 4.75 x10E6/uL (ref 3.77–5.28)
RDW: 16.7 % — ABNORMAL HIGH (ref 11.7–15.4)
RPR Ser Ql: NONREACTIVE
Rh Factor: POSITIVE
Rubella Antibodies, IGG: 3.4 index (ref >0.99)
WBC: 5.9 10*3/uL (ref 3.4–10.8)

## 2021-02-24 LAB — COMPREHENSIVE METABOLIC PANEL
ALT: 9 IU/L (ref 0–32)
AST: 13 IU/L (ref 0–40)
Albumin/Globulin Ratio: 1.4 (ref 1.2–2.2)
Albumin: 4.2 g/dL (ref 3.9–5.0)
Alkaline Phosphatase: 57 IU/L (ref 44–121)
BUN/Creatinine Ratio: 7 — ABNORMAL LOW (ref 9–23)
BUN: 4 mg/dL — ABNORMAL LOW (ref 6–20)
Bilirubin Total: 0.3 mg/dL (ref 0.0–1.2)
CO2: 18 mmol/L — ABNORMAL LOW (ref 20–29)
Calcium: 9.8 mg/dL (ref 8.7–10.2)
Chloride: 103 mmol/L (ref 96–106)
Creatinine, Ser: 0.54 mg/dL — ABNORMAL LOW (ref 0.57–1.00)
Globulin, Total: 2.9 g/dL (ref 1.5–4.5)
Glucose: 67 mg/dL (ref 65–99)
Potassium: 3.9 mmol/L (ref 3.5–5.2)
Sodium: 137 mmol/L (ref 134–144)
Total Protein: 7.1 g/dL (ref 6.0–8.5)
eGFR: 133 mL/min/{1.73_m2} (ref >59)

## 2021-02-24 LAB — PROTEIN / CREATININE RATIO, URINE
Creatinine, Urine: 184.4 mg/dL
Protein, Ur: 9.8 mg/dL
Protein/Creat Ratio: 53 mg/g creat (ref 0–200)

## 2021-02-24 LAB — HCV INTERPRETATION

## 2021-02-25 LAB — CULTURE, OB URINE

## 2021-02-25 LAB — URINE CULTURE, OB REFLEX: Organism ID, Bacteria: NO GROWTH

## 2021-02-27 ENCOUNTER — Encounter: Payer: Self-pay | Admitting: Family Medicine

## 2021-03-06 ENCOUNTER — Other Ambulatory Visit: Payer: Self-pay

## 2021-03-06 ENCOUNTER — Ambulatory Visit
Admission: RE | Admit: 2021-03-06 | Discharge: 2021-03-06 | Disposition: A | Discharge status: Home / Self Care | Payer: Medicaid Other | Source: Ambulatory Visit | Attending: Family Medicine | Admitting: Family Medicine

## 2021-03-06 ENCOUNTER — Other Ambulatory Visit: Payer: Self-pay | Admitting: Family Medicine

## 2021-03-06 DIAGNOSIS — Z3490 Encounter for supervision of normal pregnancy, unspecified, unspecified trimester: Secondary | ICD-10-CM | POA: Diagnosis present

## 2021-03-07 ENCOUNTER — Telehealth: Payer: Self-pay | Admitting: Obstetrics and Gynecology

## 2021-03-07 ENCOUNTER — Other Ambulatory Visit: Payer: Self-pay | Admitting: Family Medicine

## 2021-03-07 NOTE — Telephone Encounter (Signed)
Reviewed normal U/S results. Patient has appt with [facility] on 04/18/21. Patient had no further questions.  Raelyn Mora, CNM   03/07/2021 3:43 PM

## 2021-03-29 ENCOUNTER — Other Ambulatory Visit: Payer: Self-pay

## 2021-03-29 ENCOUNTER — Ambulatory Visit (INDEPENDENT_AMBULATORY_CARE_PROVIDER_SITE_OTHER): Payer: Medicaid Other | Admitting: Certified Nurse Midwife

## 2021-03-29 VITALS — BP 120/83 | HR 102 | Wt 156.1 lb

## 2021-03-29 DIAGNOSIS — Z3492 Encounter for supervision of normal pregnancy, unspecified, second trimester: Secondary | ICD-10-CM

## 2021-03-29 DIAGNOSIS — N760 Acute vaginitis: Secondary | ICD-10-CM

## 2021-03-29 DIAGNOSIS — Z3493 Encounter for supervision of normal pregnancy, unspecified, third trimester: Secondary | ICD-10-CM

## 2021-03-29 DIAGNOSIS — Z3A14 14 weeks gestation of pregnancy: Secondary | ICD-10-CM

## 2021-03-29 DIAGNOSIS — B9689 Other specified bacterial agents as the cause of diseases classified elsewhere: Secondary | ICD-10-CM

## 2021-03-29 MED ORDER — NUVESSA 1.3 % VA GEL: 1.0000 | Freq: Once | VAGINAL | 0 refills | Status: AC

## 2021-03-29 NOTE — Progress Notes (Signed)
Patient reports pelvic cramps when she is laying down, denies vaginal bleeding

## 2021-03-31 NOTE — Progress Notes (Signed)
   PRENATAL VISIT NOTE  Subjective:  Rhonda Evans is a 25 y.o. G2P1001 at [redacted]w[redacted]d being seen today for ongoing prenatal care.  She is currently monitored for the following issues for this low-risk pregnancy and has Supervision of low-risk pregnancy on their problem list.  Patient reports  lower abdominal pain when laying down at night, goes away so she is able to sleep .  Contractions: Not present.  .  Movement: Absent. Denies leaking of fluid.   The following portions of the patient's history were reviewed and updated as appropriate: allergies, current medications, past family history, past medical history, past social history, past surgical history and problem list.   Objective:   Vitals:   03/29/21 1627  BP: 120/83  Pulse: (!) 102  Weight: 156 lb 1.6 oz (70.8 kg)   Fetal Status: Fetal Heart Rate (bpm): 164   Movement: Absent     General:  Alert, oriented and cooperative. Patient is in no acute distress.  Skin: Skin is warm and dry. No rash noted.   Cardiovascular: Normal heart rate noted  Respiratory: Normal respiratory effort, no problems with respiration noted  Abdomen: Soft, gravid, appropriate for gestational age.  Pain/Pressure: Present     Pelvic: Cervical exam deferred        Extremities: Normal range of motion.  Edema: None  Mental Status: Normal mood and affect. Normal behavior. Normal judgment and thought content.   Assessment and Plan:  Pregnancy: G2P1001 at [redacted]w[redacted]d 1. Supervision of low-risk pregnancy, third trimester - Doing well, overall, thinks nighttime pain is round ligament pains. Reviewed ways to ease RL pains but also recommended getting vaginal swabs to rule out infection.  2. [redacted] weeks gestation of pregnancy - Routine OB care - Genetic Screening  3. Bacterial vaginosis - metroNIDAZOLE (NUVESSA) 1.3 % GEL; Place 1 Applicatorful vaginally once for 1 dose.  Dispense: 5 g; Refill: 0  Preterm labor symptoms and general obstetric precautions including but not  limited to vaginal bleeding, contractions, leaking of fluid and fetal movement were reviewed in detail with the patient. Please refer to After Visit Summary for other counseling recommendations.   Return in about 4 weeks (around 04/26/2021) for IN-PERSON, LOB.  Future Appointments  Date Time Provider Department Center  04/27/2021  3:15 PM Brand Males, CNM [facility]   Bernerd Limbo, CNM

## 2021-04-07 ENCOUNTER — Telehealth: Payer: Self-pay | Admitting: Family Medicine

## 2021-04-07 NOTE — Telephone Encounter (Signed)
Patient calling to get her Avelina Laine Results  she said it's very important that she know because it's effecting her Marjo Bicker health.

## 2021-04-07 NOTE — Telephone Encounter (Signed)
Ottis Stain results reviewed in provider portal. Result shows low risk. Called pt to review results. Explained that Horizon carrier screening results are not resulted and should be resulted within the week.

## 2021-04-11 ENCOUNTER — Encounter: Payer: Self-pay | Admitting: General Practice

## 2021-04-11 ENCOUNTER — Telehealth: Payer: Self-pay | Admitting: Lactation Services

## 2021-04-11 DIAGNOSIS — Z3492 Encounter for supervision of normal pregnancy, unspecified, second trimester: Secondary | ICD-10-CM

## 2021-04-11 NOTE — Telephone Encounter (Signed)
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 She reports she was aware from last pregnancy.   Patient was informed that she can call [facility] at [phone] to schedule a telephone Genetic Counseling Session to discuss results.   Patient asked when her next [place] will be ordered. Per chart review, anatomy US has not been scheduled.   Called and scheduled anatomy US for 11/8 at 12:30 om MFM. My Chart message sent to patient.

## 2021-04-24 NOTE — BH Specialist Note (Signed)
Integrated Behavioral Health via Telemedicine Visit  04/24/2021 Rhonda Evans [id number]  Number of Integrated Behavioral Health visits: 1 Session Start time: 2:28  Session End time: 3:07 Total time:  39  Referring Provider: Leticia Penna, DO Patient/Family location: Center for [facility] Healthcare at [facility] for Women  [facility] Provider location: Center for [employer] at [facility] for [facility]   All persons participating in visit: Patient Rhonda Evans and Rhonda Evans   Types of Service: Individual psychotherapy and Telephone visit  I connected with Rhonda Evans and/or Rhonda Evans's  n/a  via  Telephone or Video Enabled Telemedicine Application  (Video is Caregility application) and verified that I am speaking with the correct person using two identifiers. Discussed confidentiality: Yes   I discussed the limitations of telemedicine and the availability of in person appointments.  Discussed there is a possibility of technology failure and discussed alternative modes of communication if that failure occurs.  I discussed that engaging in this telemedicine visit, they consent to the provision of behavioral healthcare and the services will be billed under their insurance.  Patient and/or legal guardian expressed understanding and consented to Telemedicine visit: Yes   Presenting Concerns: Patient and/or family reports the following symptoms/concerns: Exhaustion, crying, irritable; first panic attack in past week; open to implementing self-coping strategy to help manage emotions.  Duration of problem: Current pregnancy increase; Severity of problem: moderate  Patient and/or Family's Strengths/Protective Factors: Social connections, Concrete supports in place (healthy food, safe environments, etc.), and Sense of purpose  Goals Addressed: Patient will:  Reduce symptoms of: anxiety and depression   Increase knowledge and/or ability of: coping skills  and healthy habits   Demonstrate ability to: Increase healthy adjustment to current life circumstances  Progress towards Goals: Ongoing  Interventions: Interventions utilized:  Mindfulness or Management consultant and Psychoeducation and/or Health Education Standardized Assessments completed: Not Needed  Patient and/or Family Response: Pt agrees with treatment plan  Assessment: Patient currently experiencing Adjustment disorder with mixed anxious and depressed mood.   Patient may benefit from psychoeducation and brief therapeutic interventions regarding coping with symptoms of anxiety, depression .  Plan: Follow up with behavioral health clinician on : Two weeks Behavioral recommendations:  -Continue taking prenatal vitamin daily -CALM relaxation breathing exercise twice daily (morning; at bedtime) -Consider viewing [email or web address] for video tour of [facility] -Read educational materials regarding coping with symptoms of anxiety with panic -Consider apps (on AVS) for additional self-coping   Referral(s): Integrated [employer] (In Clinic)  I discussed the assessment and treatment plan with the patient and/or parent/guardian. They were provided an opportunity to ask questions and all were answered. They agreed with the plan and demonstrated an understanding of the instructions.   They were advised to call back or seek an in-person evaluation if the symptoms worsen or if the condition fails to improve as anticipated.  Rae Lips, LCSW  Depression screen [facility] 2/9 03/29/2021 02/23/2021  Decreased Interest 1 3  Down, Depressed, Hopeless 1 3  PHQ - 2 Score 2 6  Altered sleeping 3 3  Tired, decreased energy 3 3  Change in appetite 2 3  Feeling bad or failure about yourself  1 3  Trouble concentrating 2 2  Moving slowly or fidgety/restless 1 1  Suicidal thoughts 0 0  PHQ-9 Score 14 21   GAD 7 : Generalized Anxiety Score 03/29/2021 02/23/2021   Nervous, Anxious, on Edge 1 3  Control/stop worrying 1 2  Worry too much - different things 2 3  Trouble relaxing 2 2  Restless 3 1  Easily annoyed or irritable 3 3  Afraid - awful might happen 0 2  Total GAD 7 Score 12 16

## 2021-04-27 ENCOUNTER — Ambulatory Visit (INDEPENDENT_AMBULATORY_CARE_PROVIDER_SITE_OTHER): Payer: Medicaid Other

## 2021-04-27 ENCOUNTER — Other Ambulatory Visit (HOSPITAL_COMMUNITY)
Admission: RE | Admit: 2021-04-27 | Discharge: 2021-04-27 | Disposition: A | Discharge status: Home / Self Care | Payer: Medicaid Other | Source: Ambulatory Visit

## 2021-04-27 ENCOUNTER — Other Ambulatory Visit: Payer: Self-pay

## 2021-04-27 VITALS — BP 117/77 | HR 96 | Wt 162.0 lb

## 2021-04-27 DIAGNOSIS — Z3492 Encounter for supervision of normal pregnancy, unspecified, second trimester: Secondary | ICD-10-CM | POA: Insufficient documentation

## 2021-04-27 DIAGNOSIS — Z3A18 18 weeks gestation of pregnancy: Secondary | ICD-10-CM

## 2021-04-27 NOTE — Progress Notes (Signed)
   PRENATAL VISIT NOTE  Subjective:  Rhonda Evans is a 25 y.o. G2P1001 at [redacted]w[redacted]d being seen today for ongoing prenatal care.  She is currently monitored for the following issues for this low-risk pregnancy and has Supervision of low-risk pregnancy on their problem list.  Patient reports no complaints.  Contractions: Not present. Vag. Bleeding: None.  Movement: Present. Denies leaking of fluid.   The following portions of the patient's history were reviewed and updated as appropriate: allergies, current medications, past family history, past medical history, past social history, past surgical history and problem list.   Objective:   Vitals:   04/27/21 1538  BP: 117/77  Pulse: 96  Weight: 162 lb (73.5 kg)    Fetal Status: Fetal Heart Rate (bpm): 151   Movement: Present     General:  Alert, oriented and cooperative. Patient is in no acute distress.  Skin: Skin is warm and dry. No rash noted.   Cardiovascular: Normal heart rate noted  Respiratory: Normal respiratory effort, no problems with respiration noted  Abdomen: Soft, gravid, appropriate for gestational age.  Pain/Pressure: Present     Pelvic: Cervical exam deferred        Extremities: Normal range of motion.  Edema: None  Mental Status: Normal mood and affect. Normal behavior. Normal judgment and thought content.   Assessment and Plan:  Pregnancy: G2P1001 at [redacted]w[redacted]d 1. Supervision of low-risk pregnancy, second trimester - Routine OB - Doing well. No concerns  - AFP, Serum, Open Spina Bifida - Cytology - PAP( [place]) - Cervicovaginal ancillary only( [place])  2. [redacted] weeks gestation of pregnancy   Preterm labor symptoms and general obstetric precautions including but not limited to vaginal bleeding, contractions, leaking of fluid and fetal movement were reviewed in detail with the patient. Please refer to After Visit Summary for other counseling recommendations.   Return in about 4 weeks (around  05/25/2021).  Future Appointments  Date Time Provider Department Center  05/01/2021  2:15 PM [facility] HEALTH CLINICIAN [facility]  05/02/2021 12:45 PM [facility] US4 [facility]  05/25/2021  3:15 PM Hermina Staggers, MD [facility]     Brand Males, CNM 04/27/21 4:40 PM

## 2021-04-28 LAB — CERVICOVAGINAL ANCILLARY ONLY
Bacterial Vaginitis (gardnerella): NEGATIVE
Candida Glabrata: NEGATIVE
Candida Vaginitis: NEGATIVE
Comment: NEGATIVE
Comment: NEGATIVE
Comment: NEGATIVE

## 2021-04-29 LAB — AFP, SERUM, OPEN SPINA BIFIDA
AFP MoM: 1.27
AFP Value: 62.2 ng/mL
Gest. Age on Collection Date: 18.2 weeks
Maternal Age At EDD: 24.3 yr
OSBR Risk 1 IN: 10000
Test Results:: NEGATIVE
Weight: 162 [lb_av]

## 2021-05-01 ENCOUNTER — Other Ambulatory Visit: Payer: Self-pay

## 2021-05-01 ENCOUNTER — Ambulatory Visit (INDEPENDENT_AMBULATORY_CARE_PROVIDER_SITE_OTHER): Payer: Medicaid Other | Admitting: Clinical

## 2021-05-01 DIAGNOSIS — F4323 Adjustment disorder with mixed anxiety and depressed mood: Secondary | ICD-10-CM

## 2021-05-01 NOTE — Patient Instructions (Signed)
[facility] for [facility] [address] [place], [place] [address] [phone] (main office) [phone] [facility] office)  [email or web address]   Coping with Panic Attacks   What is a panic attack?  You may have had a panic attack if you experienced four or more of the symptoms listed below coming on abruptly and peaking in about 10 minutes.  Panic Symptoms    Pounding heart   Sweating   Trembling or shaking   Shortness of breath   Feeling of choking   Chest pain   Nausea or abdominal distress     Feeling dizzy, unsteady, lightheaded, or faint   Feelings of unreality or being detached from yourself   Fear of losing control or going crazy   Fear of dying   Numbness or tingling   Chills or hot flashes      Panic attacks are sometimes accompanied by avoidance of certain places or situations. These are often situations that would be difficult to escape from or in which help might not be available. Examples might include crowded shopping malls, public transportation, restaurants, or driving.   Why do panic attacks occur?   Panic attacks are the body's alarm system gone awry. All of [place] have a built-in alarm system, powered by adrenaline, which increases our heart rate, breathing, and blood flow in response to danger. Ordinarily, this 'danger response system' works well. In some people, however, the response is either out of proportion to whatever stress is going on, or may come out of the blue without any stress at all.   For example, if you are walking in the woods and see a bear coming your way, a variety of changes occur in your body to prepare you to either fight the danger or flee from the situation. Your heart rate will increase to get more blood flow around your body, your breathing rate will quicken so that more oxygen is available, and your muscles will tighten in order to be ready to fight or run. You may feel nauseated as blood flow  leaves your stomach area and moves into your limbs. These bodily changes are all essential to helping you survive the dangerous situation. After the danger has passed, your body functions will begin to go back to normal. This is because your body also has a system for "recovering" by bringing your body back down to a normal state when the danger is over.   As you can see, the emergency response system is adaptive when there is, in fact, a "true" or "real" danger (e.g., bear). However, sometimes people find that their emergency response system is triggered in "everyday" situations where there really is no true physical danger (e.g., in a meeting, in the grocery store, while driving in normal traffic, etc.).   What triggers a panic attack?  Sometimes particularly stressful situations can trigger a panic attack. For example, an argument with your spouse or stressors at work can cause a stress response (activating the emergency response system) because you perceive it as threatening or overwhelming, even if there is no direct risk to your survival.  Sometimes panic attacks don't seem to be triggered by anything in particular- they may "come out of the blue". Somehow, the natural "fight or flight" emergency response system has gotten activated when there is no real danger. Why does the body go into "emergency mode" when there is no real danger?   Often, people with panic attacks are frightened or alarmed by the  physical sensations of the emergency response system. First, unexpected physical sensations are experienced (tightness in your chest or some shortness of breath). This then leads to feeling fearful or alarmed by these symptoms ("Something's wrong!", "Am I having a heart attack?", "Am I going to faint?") The mind perceives that there is a danger even though no real danger exists. This, in turn, activates the emergency response system ("fight or flight"), leading to a "full blown" panic attack. In summary,  panic attacks occur when we misinterpret physical symptoms as signs of impending death, craziness, loss of control, embarrassment, or fear of fear. Sometimes you may be aware of thoughts of danger that activate the emergency response system (for example, thinking "I'm having a heart attack" when you feel chest pressure or increased heart rate). At other times, however, you may not be aware of such thoughts. After several incidences of being afraid of physical sensations, anxiety and panic can occur in response to the initial sensations without conscious thoughts of danger. Instead, you just feel afraid or alarmed. In other words, the panic or fear may seem to occur "automatically" without you consciously telling yourself anything.   After having had one or more panic attacks, you may also become more focused on what is going on inside your body. You may scan your body and be more vigilant about noticing any symptoms that might signal the start of a panic attack. This makes it easier for panic attacks to happen again because you pick up on sensations you might otherwise not have noticed, and misinterpret them as something dangerous. A panic attack may then result.      How do I cope with panic attacks?  An important part of overcoming panic attacks involves re-interpreting your body's physical reactions and teaching yourself ways to decrease the physical arousal. This can be done through practicing the cognitive and behavioral interventions below.   Research has found that over half of people who have panic attacks show some signs of hyperventilation or overbreathing. This can produce initial sensations that alarm you and lead to a panic attack. Overbreathing can also develop as part of the panic attack and make the symptoms worse. When people hyperventilate, certain blood vessels in the body become narrower. In particular, the brain may get slightly less oxygen. This can lead to the symptoms of dizziness,  confusion, and lightheadedness that often occur during panic attacks. Other parts of the body may also get a bit less oxygen, which may lead to numbness or tingling in the hands or feet or the sensation of cold, clammy hands. It also may lead the heart to pump harder. Although these symptoms may be frightening and feel unpleasant, it is important to remember that hyperventilating is not dangerous. However, you can help overcome the unpleasantness of overbreathing by practicing Breathing Retraining.   Practice this basic technique three times a day, every day:   Inhale. With your shoulders relaxed, inhale as slowly and deeply as you can while you count to six. If you can, use your diaphragm to fill your lungs with air.   Hold. Keep the air in your lungs as you slowly count to four.   Exhale. Slowly breath out as you count to six.   Repeat. Do the inhale-hold-exhale cycle several times. Each time you do it, exhale for longer counts.  Like any new skill, Breathing Retraining requires practice. Try practicing this skill twice a day for several minutes. Initially, do not try this technique in specific situations  or when you become frightened or have a panic attack. Begin by practicing in a quiet environment to build up your skill level so that you can later use it in time of "emergency."   2. Decreasing Avoidance  Regardless of whether you can identify why you began having panic attacks or whether they seemed to come out of the blue, the places where you began having panic attacks often can become triggers themselves. It is not uncommon for individuals to begin to avoid the places where they have had panic attacks. Over time, the individual may begin to avoid more and more places, thereby decreasing their activities and often negatively impacting their quality of life. To break the cycle of avoidance, it is important to first identify the places or situations that are being avoided, and then to do some  "relearning."  To begin this intervention, first create a list of locations or situations that you tend to avoid. Then choose an avoided location or situation that you would like to target first. Now develop an "exposure hierarchy" for this situation or location. An "exposure hierarchy" is a list of actions that make you feel anxious in this situation. Order these actions from least to most anxiety-producing. It is often helpful to have the first item on your hierarchy involve thinking or imagining part of the feared/avoided situation.   Here is an example of an exposure hierarchy for decreasing avoidance of the grocery store. Note how it is ordered from the least amount of anxiety (at the top) to the most anxiety (at the bottom):   Think about going to the grocery store alone.   Go to the grocery store with a friend or family member.   Go to the grocery store alone to pick up a few small items (5-10 minutes in the store).   Shopping for 10-20 minutes in the store alone.   Doing the shopping for the week by myself (20-30 minutes in the store).   Your homework is to "expose" yourself to the lowest item on your hierarchy and use your breathing relaxation and coping statements (see below) to help you remain in the situation. Practice this several times during the upcoming week. Once you have mastered each item with minimal anxiety, move on to the next higher action on your list.   Cognitive Interventions  Identify your negative self-talk Anxious thoughts can increase anxiety symptoms and panic. The first step in changing anxious thinking is to identify your own negative, alarming self-talk. Some common alarming thoughts:  I'm having a heart attack.            I must be going crazy. I think I'm dying. People will think I'm crazy. I'm going to pass our.  Oh no- here it comes.  I can't stand this.  I've got to get out of here!  2. Use positive coping statements Changing or disrupting a pattern of  anxious thoughts by replacing them with more calming or supportive statements can help to divert a panic attack. Some common helpful coping statements:  This is not an emergency.  I don't like feeling this way, but I can accept it.  I can feel like this and still be okay.  This has happened before, and I was okay. I'll be okay this time, too.  I can be anxious and still deal with this situation.   /Emotional Wellbeing Apps and Websites Here are a few free apps meant to help you to help yourself.  To find,  try searching on the internet to see if the app is offered on Apple/Android devices. If your first choice doesn't come up on your device, the good news is that there are many choices! Play around with different apps to see which ones are helpful to you.    Calm This is an app meant to help increase calm feelings. Includes info, strategies, and tools for tracking your feelings.      Calm Harm  This app is meant to help with self-harm. Provides many 5-minute or 15-min coping strategies for doing instead of hurting yourself.       Healthy Minds Health Minds is a problem-solving tool to help deal with emotions and cope with stress you encounter wherever you are.      MindShift This app can help people cope with anxiety. Rather than trying to avoid anxiety, you can make an important shift and face it.      MY3  MY3 features a support system, safety plan and resources with the goal of offering a tool to use in a time of need.       My Life My Voice  This mood journal offers a simple solution for tracking your thoughts, feelings and moods. Animated emoticons can help identify your mood.       Relax Melodies Designed to help with sleep, on this app you can mix sounds and meditations for relaxation.      Smiling Mind Smiling Mind is meditation made easy: it's a simple tool that helps put a smile on your mind.        Stop, Breathe & Think  A friendly, simple guide for  people through meditations for mindfulness and compassion.  Stop, Breathe and Think Kids Enter your current feelings and choose a "mission" to help you cope. Offers videos for certain moods instead of just sound recordings.       Team Orange The goal of this tool is to help teens change how they think, act, and react. This app helps you focus on your own good feelings and experiences.      The [employer] The [employer] ([employer]) contains simple tools to help patients with coping, relaxation, distraction, and positive thinking.

## 2021-05-02 ENCOUNTER — Ambulatory Visit: Payer: Medicaid Other | Attending: Certified Nurse Midwife

## 2021-05-02 ENCOUNTER — Other Ambulatory Visit: Payer: Self-pay | Admitting: Certified Nurse Midwife

## 2021-05-02 DIAGNOSIS — D582 Other hemoglobinopathies: Secondary | ICD-10-CM

## 2021-05-02 DIAGNOSIS — Z363 Encounter for antenatal screening for malformations: Secondary | ICD-10-CM

## 2021-05-02 DIAGNOSIS — Z3492 Encounter for supervision of normal pregnancy, unspecified, second trimester: Secondary | ICD-10-CM

## 2021-05-02 LAB — CYTOLOGY - PAP
Chlamydia: NEGATIVE
Comment: NEGATIVE
Comment: NORMAL
Neisseria Gonorrhea: NEGATIVE

## 2021-05-02 NOTE — BH Specialist Note (Signed)
Pt did not arrive to video visit and did not answer the phone ; ; left MyChart message for patient; unable to leave voicemail as mailbox is full.

## 2021-05-15 ENCOUNTER — Ambulatory Visit: Payer: Medicaid Other | Admitting: Clinical

## 2021-05-15 DIAGNOSIS — Z91199 Patient's noncompliance with other medical treatment and regimen due to unspecified reason: Secondary | ICD-10-CM

## 2021-05-25 ENCOUNTER — Ambulatory Visit (INDEPENDENT_AMBULATORY_CARE_PROVIDER_SITE_OTHER): Payer: Medicaid Other | Admitting: Obstetrics and Gynecology

## 2021-05-25 ENCOUNTER — Other Ambulatory Visit: Payer: Self-pay

## 2021-05-25 ENCOUNTER — Encounter: Payer: Medicaid Other | Admitting: Obstetrics and Gynecology

## 2021-05-25 ENCOUNTER — Encounter: Payer: Self-pay | Admitting: Obstetrics and Gynecology

## 2021-05-25 VITALS — BP 101/70 | HR 97 | Wt 164.3 lb

## 2021-05-25 DIAGNOSIS — Z3490 Encounter for supervision of normal pregnancy, unspecified, unspecified trimester: Secondary | ICD-10-CM

## 2021-05-25 NOTE — Progress Notes (Signed)
Subjective:  Rhonda Evans is a 25 y.o. G2P1001 at [redacted]w[redacted]d being seen today for ongoing prenatal care.  She is currently monitored for the following issues for this low-risk pregnancy and has Supervision of low-risk pregnancy on their problem list.  Patient reports no complaints.  Contractions: Not present. Vag. Bleeding: None.  Movement: Present. Denies leaking of fluid.   The following portions of the patient's history were reviewed and updated as appropriate: allergies, current medications, past family history, past medical history, past social history, past surgical history and problem list. Problem list updated.  Objective:   Vitals:   05/25/21 1546  BP: 101/70  Pulse: 97  Weight: 164 lb 4.8 oz (74.5 kg)    Fetal Status: Fetal Heart Rate (bpm): 149   Movement: Present     General:  Alert, oriented and cooperative. Patient is in no acute distress.  Skin: Skin is warm and dry. No rash noted.   Cardiovascular: Normal heart rate noted  Respiratory: Normal respiratory effort, no problems with respiration noted  Abdomen: Soft, gravid, appropriate for gestational age. Pain/Pressure: Absent     Pelvic:  Cervical exam deferred        Extremities: Normal range of motion.  Edema: None  Mental Status: Normal mood and affect. Normal behavior. Normal judgment and thought content.   Urinalysis:      Assessment and Plan:  Pregnancy: G2P1001 at [redacted]w[redacted]d  1. Encounter for supervision of low-risk pregnancy, antepartum Stable  Preterm labor symptoms and general obstetric precautions including but not limited to vaginal bleeding, contractions, leaking of fluid and fetal movement were reviewed in detail with the patient. Please refer to After Visit Summary for other counseling recommendations.  Return in about 4 weeks (around 06/22/2021) for OB visit, face to face, any provider.   Hermina Staggers, MD

## 2021-05-25 NOTE — Patient Instructions (Signed)

## 2021-06-07 ENCOUNTER — Encounter: Payer: Self-pay | Admitting: *Deleted

## 2021-06-23 ENCOUNTER — Other Ambulatory Visit: Payer: Self-pay

## 2021-06-23 ENCOUNTER — Ambulatory Visit (INDEPENDENT_AMBULATORY_CARE_PROVIDER_SITE_OTHER): Payer: Medicaid Other | Admitting: Nurse Practitioner

## 2021-06-23 VITALS — BP 114/70 | HR 97 | Wt 172.2 lb

## 2021-06-23 DIAGNOSIS — R1030 Lower abdominal pain, unspecified: Secondary | ICD-10-CM

## 2021-06-23 DIAGNOSIS — Z3A26 26 weeks gestation of pregnancy: Secondary | ICD-10-CM

## 2021-06-23 DIAGNOSIS — O99013 Anemia complicating pregnancy, third trimester: Secondary | ICD-10-CM

## 2021-06-23 DIAGNOSIS — Z3490 Encounter for supervision of normal pregnancy, unspecified, unspecified trimester: Secondary | ICD-10-CM

## 2021-06-23 NOTE — Patient Instructions (Addendum)
[email or web address] for childbirth classes   AREA PEDIATRIC/FAMILY PRACTICE PHYSICIANS  Central/[place]outheast [place] ([id number]) [facility] Deirdre Priest, MD; Lum Babe, MD; Sheffield Slider, MD; Leveda Anna, MD; McDiarmid, MD; Jerene Bears, MD; Jennette Kettle, MD; Gwendolyn Grant, MD [address], [place], [place] [address] [phone] Mon-Fri 8:30-12:30, 1:30-5:00 Providers come to see babies at [facility] Accepting [facility] at [facility] providers who accept newborns: Docia Chuck, MD; Kateri Plummer, MD; Paulino Rily, MD [address], [place], [place] [address] [phone] Mon-Fri 8:00-5:30 Babies seen by providers at [facility] Does NOT accept Medicaid Please call early in hospitalization for appointment (limited availability)  [facility] Burdette, MD [address] [place], [place] [address] [phone] Mon, Tue, Thur, Fri 8:30-5:00, Wed 10:00-7:00 (closed 1-2pm) Babies seen by [facility] providers Accepting Medicaid Donnie Coffin - Pediatrician Donnie Coffin, MD [address], [place], [place] [address] [phone] Mon-Fri 8:30-5:00, Sat 8:30-12:00 Provider comes to see babies at [facility] Accepting Medicaid Must have been referred from current patients or contacted office prior to delivery [facility]) Manson Passey, MD; Ave Filter, MD; Luna Fuse, MD; Kennedy Bucker, MD; Konrad Dolores, MD; Kathlene November, MD; Jenne Campus, MD; Lubertha South, MD; Wynetta Emery, MD; Duffy Rhody, MD; Gerre Couch, NP; Shirl Harris, NP [address] [place], [place], [place] [address] [phone] Mon, Halford Decamp, Thur, Fri 8:30-5:30, Wed 9:30-5:30, Sat 8:30-12:30 Babies seen by [facility] providers Accepting Medicaid Only accepting infants of first-time parents or siblings of current patients Hospital discharge coordinator will make follow-up appointment Cyril Mourning [address] [place], [place]   [address] [phone]   Fax - [phone] [facility] [address], [place], [place]  [address] Phone - [phone]   Fax - [phone] Lucio Edward [address] [place], [place]  [address] [phone]  [place] [phone]) [facility] Jenne Pane, MD; Alita Chyle, MD; Princella Ion, MD; MD; Earlene Plater, MD; Jamesetta Orleans, MD; Alvera Novel, MD; Clarene Duke, MD; Rana Snare, MD; Carmon Ginsberg, MD; Alinda Money, MD; Hosie Poisson, MD; Mayford Knife, MD [address] [place], [place] [address] [phone] Mon-Fri 8:30-5:00 (extended evenings Mon-Thur as needed), Sat-Sun 10:00-1:00 Providers come to see babies at [facility] Accepting Medicaid for families of first-time babies and families with all children in the household age 88 and under. Must register with office prior to making appointment (M-F only). [facility] Suezanne Jacquet, NP; Lynelle Doctor, MD; Susann Givens, MD; [place], [place] [address], [place], [place] [address] [phone] Mon-Fri 8:00-5:00 Babies seen by providers at [facility] Does NOT accept Medicaid/Commercial Insurance Only [facility] at [place] ([facility])  Holly Bodily, MD; Zachery Dauer, MD; Stefan Church, MD; Sabino Dick, MD; Quitman Livings, MD; Farris Has, MD; Gaynell Face, MD; Betha Loa, MD; Colon Flattery, MD; Clifton James, MD [address] [place], [place] [address] [phone] Mon-Fri 8:30-5:30, Sat (Oct.-Mar.) 9:00-1:00 Babies seen by providers at [facility] Accepting [facility]  [place] [phone]) [facility] Nestle, MD; Sheliah Hatch, MD [address], [place], [place] [address] [phone] Mon-Fri 8:30-5:00, Sat 8:30-12:00 Providers come to see babies at [facility] Does NOT accept [facility] at Lutricia Fe[place], [place]; Tracie Harrier, MD; [place], [place]; Wynelle Link, MD; Azucena Cecil, MD [address], [place], [place] [address] [phone] Mon-Fri 8:00-5:00 Babies seen by providers at [facility] Does NOT accept Medicaid Only accepting babies of parents who are patients Please call early in hospitalization for appointment (limited availability) [facility] Pediatricians Chestine Spore, MD; Abran Cantor, MD; Early Osmond, MD; Cherre Huger, NP; Hyacinth Meeker, MD; Dwan Bolt, MD; Jarold Motto, NP; Dario Guardian, MD; Talmage Nap, MD; Maisie Fus,  MD; Pricilla Holm, MD; Tama High, MD [address] [place], [place], [place] [address] [phone] Mon-Fri 8:00-5:00, Sat 9:00-12:00 Providers come to see babies at [facility] Does NOT accept [facility] [phone]) [facility] at [facility] providers accepting new patients: Drema Pry, NP; Delena Serve, PA [address] [place], [place] [address] [phone] Mon-Fri 8:00-5:00 Babies seen by providers at [facility] Does NOT accept Medicaid Only accepting babies of parents who are patients Please call early in hospitalization for appointment (limited availability) [facility] Cardell Peach, MD; Nash Dimmer, MD [address] [place] [place], [place] [address] [phone] (press 1 to schedule appointment) Mon-Fri 8:00-5:00 Providers come to see babies at [facility] Does NOT accept [facility], MD [address] [place], [place] [address] [phone] Mon-Fri 8:30-5:00 (lunch 12:30-1:00), extended hours by appointment only Wed 5:00-6:30 Babies seen by [facility] providers Accepting Medicaid [facility] [place] Carmine, MD; [place], MD; Hassan Rowan, MD [address] [place], [place], [place] [address] [phone] Mon-Fri 8:00-5:00 Babies seen by [facility] providers Does NOT accept Medicaid [facility] [place] Boykin Peek, MD; Durene Cal, MD; [place], [place] [address] [place], [place] [address] [phone] Mon-Fri 8:00-5:00 Babies seen by [facility] providers Does NOT accept [facility] [place], [place]; [place], [place]; [place], [place]; Avis Epley, MD; Vonna Kotyk, MD; Clance Boll,  MD; Stevphen Rochester, NP; Arvilla Market, NP; Ann Maki, NP; Otis Dials, NP; Vaughan Basta, MD; Eartha Inch, MD [address] [place], [place] [address] [phone] Mon-Fri 8:30-5:00, Sat 10:00-1:00 Providers come to see babies at [facility] Does NOT accept Medicaid Free prenatal information session Tuesdays at 4:45pm [facility], MD; [place], [place]; [place], [place]; [place], [place] [address] [place] [address] [phone] Mon-Fri 7:30-5:30 Babies seen by [facility] providers [facility] Doctor [address], [place], [place]  [address] [phone]   Fax - [phone]  [place] [phone] & [phone]) [facility], MD [address] [place], [place] [address] [phone] Mon-Thur 8:00-6:00 Providers come to see babies at [facility] Accepting Medicaid [facility] Dareen Piano, NP; Cyndia Bent, MD; [place], [place]; [place], [place] [address] [place], [place] [address] [phone] Mon-Thur 7:30-7:30, Fri 7:30-4:30 Babies seen by [facility] providers Accepting [facility] Pediatrics Juanito Doom, MD; Janene Harvey, NP; Vonita Moss, MD [address], [place], [place] [address] [phone] Mon-Fri 8:30-5:00, Sat 8:30-12:00 Providers come to see babies at [facility] Accepting Medicaid Must have Meet & Greet appointment at office prior to delivery [facility] [place] [facility]) Marlow Baars, MD; Earlene Plater, MD; Lucretia Roers, MD [address], [place], [place] [address] [phone] Mon-Wed 8:00-6:00, Thur-Fri 8:00-5:00, Sat 9:00-12:00 Providers come to see babies at [facility] Does NOT accept Medicaid Only accepting siblings of current patients [facility]  [address], [place], [place]  [address] [phone]   Fax - [phone] [facility] Medicine at [facility] [address]  [place], [place]  [address] [phone]   Fax - [phone]  [place] [phone] & [phone]) Adult nurse Health[facility] at [facility], [place]; [place], [place] [address] [place], [place] [address] [phone] Mon-Fri 7:00-5:00 Babies seen by [facility] providers Does NOT accept Medicaid [facility] [place], MD; [place], [place]; [place], [place] [address], [place], [place] [address] [phone] Mon-Fri 8:00-5:00 Babies seen by [facility] providers Accepting [facility] - [address] [place], MD; [place], [place]; Porter, NP; [place], [place] [address] Shelva Majestic  [address] [place], [place], [place] [address] [phone] Mon-Fri 8:00-5:00 Babies seen by providers at [facility] Accepting [facility] [place] [phone]) [facility] Primary Care at [facility] [place], [place] [address] [place], [place] [address] [phone] Mon-Fri 8:00-5:00 Babies seen by [facility] providers Does NOT accept Medicaid Limited availability, please call early in hospitalization to schedule follow-up [facility] Jeanelle [place]alling, [place]; Eddie Candle, MD; [place]and Sloop, MD; [place], [place]; Constance Goltz, MD; [place], [place] [address] [facility] [address], [place], [place] [address] [phone] Mon-Fri 8:30-5:00, Sat 9:00-12:00 Babies seen by providers at [facility] Accepting Medicaid Please register online then schedule online or call office [email or web address] - [facility] at [facility]) Durene Cal, NP; Lucianne Muss, MD; [place], [place] [address], [place], [place] [address] [phone] Mon-Fri 8:00-5:00 Babies seen by providers at [facility] Accepting [facility] [place] ([facility] [place]) [place], MD; Reed Breech, NP; Shelva Majestic, MD [address], [place], [place] [address] [phone] Mon-Fri  8:00-5:30, Sat&Sun by appointment (phones open at 8:30) Babies seen by [facility] providers Accepting Medicaid Must be a first-time baby or sibling of current patient [facility] - [place]  [address], [place], [place]  [address] [phone]   Fax - [phone]  [place] [address] [phone] & [phone]) [facility] Medicine [place], [place]; [place], [place]; [place], MD; [place], [place]; Carolyne Fiscal, MD [address] [place], [place] [address] [phone] Mon-Thur 8:00-7:00, Fri 8:00-5:00, Sat 8:00-12:00, Sun 9:00-12:00 Babies seen by [facility] providers Accepting Medicaid [facility] - Family Medicine at Liana Gerold, MD; Gaynell Face, MD; [facility], MD [address], [place], [place] [address] [phone] Mon-Thur 8:00-5:00 Babies seen by providers at [facility] Accepting Medicaid [facility] - Family Medicine at Dorthey Sawyer, MD; [place], MD; Madilyn Fireman, MD; Melvyn Neth, MD; List, MD; Lazarus Salines, MD; Gaynell Face, MD; Berneda Rose, MD; Flora Lipps, MD; Beryl Meager, MD; Luther Redo, MD; Lavonia Drafts, MD; Kellie Simmering, MD [address] [place], [place] [address] [phone] Mon-Fri 8:00-5:30, Sat (Oct.-Mar.) 9:00-1:00 Babies seen by providers at [facility] Accepting Medicaid Must fill out new patient packet, available online at [email or web address] - [place] [facility] at [facility]) Spero Geralds, NP; Tiburcio Pea, NP; Tresa Endo, NP; Whitney Post, MD; [place], [place]; Hennie Duos, MD; Benjamin, MD; Kavin Leech, NP [address], [place], [place] [address] [phone] Mon-Thur 8:00-5:30, Fri 8:00-5:00 Babies seen by providers at [facility] Accepting [facility]  [place] [phone]) [facility] [place], [place]; [place], MD; [place], MD; [place], [place] [address] [place], [place] [address] [phone] Mon-Fri 8:00-5:00 Babies seen by providers at [facility] Accepting [facility]   [place] [phone]) [facility] at [facility], [place]; Lenise Arena, MD; [place], [place] [address], [place], [place] [address] [phone] Mon-Fri 8:00-5:00 Babies seen by providers at [facility] Does NOT accept Medicaid Limited appointment availability, please call early in hospitalization  [facility], [place]; [place], MD [address] [place], [place] [address] [phone] Mon-Fri 8:00-5:00 Babies seen by [facility] providers Does NOT accept [facility], MD; Ninetta Lights, MD; [place], [place]; [place], MD [address], [place], [place] [address] [phone] Mon-Fri 8:00-5:00 After hours clinic [facility] [place], [place] [address]) [phone] Mon-Fri 5:00-8:00, Sat 12:00-6:00, Sun 10:00-4:00 Babies seen by [facility] providers Accepting [facility] Family  Medicine at [facility] [address] [place], [place]  [address] [phone]   Fax - [phone]  [place] [phone]) Adult nurse HealthCare at [facility], MD [address] [place], [place], [place] [address] [phone] Mon-Fri 8:00-5:00 Babies seen by [facility] providers Does NOT accept Medicaid [facility] - [place] [facility] at [place]) Rene Kocher, MD [address], [place], [place] [address] [phone] Mon-Thur 8:00-7:00, Fri 8:00-5:00, Sat 8:00-12:00 Babies seen by providers at [facility] Accepting Medicaid - but does not have vaccinations in office (must be received elsewhere) Limited availability, please call early in hospitalization  [place] [phone]) [facility]  Wyvonne Lenz, MD [address], [place] [address] [phone]  Fax [phone]  [facility]  Lyndel Safe, MD, [place], [place], [place], [place] [address] [place], [place] [address] [phone] [facility]  [address] Maroon [place], [place] [address] [phone] [address] [place], [place] [address] [phone] [facility] Office)  [facility] [address], [place], [place] [address] [phone] [facility] [address] [place], [place], [place] [address] [phone] [facility] [address], [place], [place] [address] [phone] [facility] [address] [place], [place] [address] [phone] [facility] [address] [place], [place] [address] [phone] [facility] [address] [place], [place] [address] [phone] [facility]  [address] [place], [place] [address] [phone] Dr. Belia Heman. [place] [address] [place], [place] [address] [phone] [facility] [address], [place], [place] [address] [phone] [facility] [address] [place], [place] [address] [phone]

## 2021-06-23 NOTE — Progress Notes (Signed)
° ° °  Subjective:  Rhonda Evans is a 25 y.o. G2P1001 at [redacted]w[redacted]d being seen today for ongoing prenatal care.  She is currently monitored for the following issues for this low-risk pregnancy and has Supervision of low-risk pregnancy on their problem list.  Patient reports  abdominal pain .  Contractions: Not present. Vag. Bleeding: None.  Movement: Present. Denies leaking of fluid.   The following portions of the patient's history were reviewed and updated as appropriate: allergies, current medications, past family history, past medical history, past social history, past surgical history and problem list. Problem list updated.  Objective:   Vitals:   06/23/21 1107  BP: 114/70  Pulse: 97  Weight: 172 lb 3.2 oz (78.1 kg)    Fetal Status: Fetal Heart Rate (bpm): 145 Fundal Height: 28 cm Movement: Present     General:  Alert, oriented and cooperative. Patient is in no acute distress.  Skin: Skin is warm and dry. No rash noted.   Cardiovascular: Normal heart rate noted  Respiratory: Normal respiratory effort, no problems with respiration noted  Abdomen: Soft, gravid, appropriate for gestational age. Pain/Pressure: Present     Pelvic:  Cervical exam deferred        Extremities: Normal range of motion.  Edema: None  Mental Status: Normal mood and affect. Normal behavior. Normal judgment and thought content.   Urinalysis:      Assessment and Plan:  Pregnancy: G2P1001 at [redacted]w[redacted]d  1. Encounter for supervision of low-risk pregnancy, antepartum List given for pediatrician Encouraged to take childbirth classes as she does not want epidural for pain control with this labor Next visit for 28 weeks labs - anticipatory guidance given  - Glucose Tolerance, 2 Hours w/1 Hour; Future - CBC; Future - HIV Antibody (routine testing w rflx); Future - RPR; Future  2. Lower abdominal pain Having periodic lower abdominal pain that radiates to her back Declines cervical exam today Drink at least 8 8-oz  glasses of water every day. Advised if pain is worsening to be evaluated for preterm labor at the hospital No pain this morning.  No bleeding.  - Culture, OB Urine  3. [redacted] weeks gestation of pregnancy   Preterm labor symptoms and general obstetric precautions including but not limited to vaginal bleeding, contractions, leaking of fluid and fetal movement were reviewed in detail with the patient. Please refer to After Visit Summary for other counseling recommendations.  Return in about 2 weeks (around 07/07/2021) for early AM appt for ROB and fasting labs.  Nolene Bernheim, RN, MSN, NP-BC Nurse Practitioner, [facility] for [employer], [facility] 06/23/2021 11:43 AM

## 2021-06-25 LAB — CULTURE, OB URINE

## 2021-06-25 LAB — URINE CULTURE, OB REFLEX

## 2021-06-25 NOTE — L&D Delivery Note (Signed)
OB/GYN Faculty Practice Delivery Note ? ?Rhonda Evans is a 26 y.o. Y3K1601 s/p SVD at [redacted]w[redacted]d. She was admitted for eIOL.  ? ?ROM: 5h 5m with clear fluid ?GBS Status: Negative  ? ?Delivery Date/Time: 09/24/21 at 1951 ? ?Delivery: Called to room and patient was complete and pushing. Head delivered direct occiput anterior. No nuchal cord present. Shoulder and body delivered in usual fashion. Infant with spontaneous cry, placed on mother's abdomen, dried and stimulated. Cord clamped x 2 after 1-minute delay and cut by FOB under direct supervision. Cord blood drawn. Placenta delivered spontaneously with gentle cord traction. Fundus firm with massage and Pitocin. Labia, perineum, vagina, and cervix were inspected, and no lacerations were noted.  ? ?Placenta: Intact, 3VC - sent to L&D ?Complications: None ?Lacerations: None ?EBL: 200 cc ?Analgesia: None  ? ?Infant: Viable female  APGARs 8 and 9  ? ?Evalina Field, MD ?OB/GYN Fellow, Faculty Practice  ? ? ?

## 2021-07-04 ENCOUNTER — Encounter (HOSPITAL_COMMUNITY): Payer: Self-pay | Admitting: Obstetrics and Gynecology

## 2021-07-04 ENCOUNTER — Inpatient Hospital Stay (HOSPITAL_COMMUNITY)
Admission: AD | Admit: 2021-07-04 | Discharge: 2021-07-04 | Disposition: A | Discharge status: Home / Self Care | Payer: Medicaid Other | Attending: Obstetrics and Gynecology | Admitting: Obstetrics and Gynecology

## 2021-07-04 ENCOUNTER — Other Ambulatory Visit: Payer: Self-pay

## 2021-07-04 DIAGNOSIS — O26893 Other specified pregnancy related conditions, third trimester: Secondary | ICD-10-CM | POA: Diagnosis not present

## 2021-07-04 DIAGNOSIS — O26899 Other specified pregnancy related conditions, unspecified trimester: Secondary | ICD-10-CM

## 2021-07-04 DIAGNOSIS — R109 Unspecified abdominal pain: Secondary | ICD-10-CM | POA: Insufficient documentation

## 2021-07-04 DIAGNOSIS — O99613 Diseases of the digestive system complicating pregnancy, third trimester: Secondary | ICD-10-CM | POA: Insufficient documentation

## 2021-07-04 DIAGNOSIS — Z3A28 28 weeks gestation of pregnancy: Secondary | ICD-10-CM | POA: Insufficient documentation

## 2021-07-04 DIAGNOSIS — K5900 Constipation, unspecified: Secondary | ICD-10-CM | POA: Diagnosis not present

## 2021-07-04 DIAGNOSIS — R102 Pelvic and perineal pain: Secondary | ICD-10-CM | POA: Insufficient documentation

## 2021-07-04 DIAGNOSIS — Z87891 Personal history of nicotine dependence: Secondary | ICD-10-CM | POA: Insufficient documentation

## 2021-07-04 DIAGNOSIS — M549 Dorsalgia, unspecified: Secondary | ICD-10-CM | POA: Diagnosis not present

## 2021-07-04 LAB — URINALYSIS, ROUTINE W REFLEX MICROSCOPIC
Bilirubin Urine: NEGATIVE
Glucose, UA: 100 mg/dL — AB
Hgb urine dipstick: NEGATIVE
Ketones, ur: 15 mg/dL — AB
Leukocytes,Ua: NEGATIVE
Nitrite: NEGATIVE
Protein, ur: NEGATIVE mg/dL
Specific Gravity, Urine: 1.025 (ref 1.005–1.030)
pH: 6.5 (ref 5.0–8.0)

## 2021-07-04 MED ORDER — DOCUSATE SODIUM 250 MG PO CAPS: 250.0000 mg | ORAL_CAPSULE | Freq: Every day | ORAL | 1 refills | Status: DC

## 2021-07-04 NOTE — MAU Provider Note (Signed)
History     CSN: [id number]  Arrival date and time: 07/04/21 2055   Event Date/Time   First Provider Initiated Contact with Patient 07/04/21 2121      Chief Complaint  Patient presents with   Abdominal Pain   HPI Rhonda Evans is a 26 y.o. G2P1001 at [redacted]w[redacted]d who presents with abdominal pain and back pain. She reports this has been ongoing for the last month. She reports the pain comes and goes and is sometimes constant. She feels it more when she walks. She rates the pain a 6/10 and has not tried anything for the pain. She reports she hasn't drank much water today. She denies any bleeding or leaking. She reports normal fetal movement. She reports she struggles with constipation occasionally.   She states she told her OB at her last appointment about the pain and they wanted to check her cervix but she refused because she doesn't think it's labor.   OB History     Gravida  2   Para  1   Term  1   Preterm      AB      Living  1      SAB      IAB      Ectopic      Multiple      Live Births  1           Past Medical History:  Diagnosis Date   Anxiety    Depression    Medical history non-contributory     Past Surgical History:  Procedure Laterality Date   NO PAST SURGERIES      Family History  Problem Relation Age of Onset   Ovarian cancer Mother    Ovarian cancer Sister    Hypertension Maternal Aunt    Diabetes Maternal Grandmother        great   Hypertension Maternal Grandmother        great   Diabetes Maternal Grandfather    Hypertension Maternal Grandfather    Cancer Maternal Grandfather    Cancer Paternal Grandfather    Hearing loss Neg Hx     Social History   Tobacco Use   Smoking status: Never   Smokeless tobacco: Never  Vaping Use   Vaping Use: Former  Substance Use Topics   Alcohol use: No   Drug use: No    Allergies:  Allergies  Allergen Reactions   Amoxicillin Anaphylaxis and Rash    Has patient had a PCN reaction  causing immediate rash, facial/tongue/throat swelling, SOB or lightheadedness with hypotension: Yes Has patient had a PCN reaction causing severe rash involving mucus membranes or skin necrosis: No Has patient had a PCN reaction that required hospitalization Yes Has patient had a PCN reaction occurring within the last 10 years: No If all of the above answers are "NO", then may proceed with Cephalosporin use.    Latex Rash    Medications Prior to Admission  Medication Sig Dispense Refill Last Dose   Blood Pressure Monitoring (BLOOD PRESSURE KIT) DEVI 1 Device by Does not apply route as needed. 1 each 0    Doxylamine-Pyridoxine (DICLEGIS) 10-10 MG TBEC Take 2 tabs at bedtime. If needed, add another tab in the morning. If needed, add another tab in the afternoon, up to 4 tabs/day. (Patient not taking: Reported on 04/27/2021) 100 tablet 1    ibuprofen (ADVIL) 600 MG tablet Take 1 tablet (600 mg total) by mouth every 6 (six) hours as  needed. (Patient not taking: Reported on 04/27/2021) 30 tablet 0    Misc. Devices (GOJJI WEIGHT SCALE) MISC 1 Device by Does not apply route as needed. (Patient not taking: Reported on 06/23/2021) 1 each 0    oseltamivir (TAMIFLU) 75 MG capsule Take 1 capsule (75 mg total) by mouth every 12 (twelve) hours. (Patient not taking: Reported on 02/23/2021) 10 capsule 0    pantoprazole (PROTONIX) 20 MG tablet Take 1 tablet (20 mg total) by mouth daily. (Patient not taking: Reported on 06/23/2021) 30 tablet 0    Prenatal 27-1 MG TABS Take 1 tablet by mouth daily. 30 tablet 12    promethazine (PHENERGAN) 25 MG tablet Take 1 tablet (25 mg total) by mouth every 6 (six) hours as needed for nausea or vomiting. (Patient not taking: Reported on 04/27/2021) 30 tablet 1     Review of Systems  Constitutional: Negative.  Negative for fatigue and fever.  HENT: Negative.    Respiratory: Negative.  Negative for shortness of breath.   Cardiovascular: Negative.  Negative for chest pain.   Gastrointestinal:  Positive for abdominal pain and constipation. Negative for diarrhea, nausea and vomiting.  Genitourinary: Negative.  Negative for dysuria, vaginal bleeding and vaginal discharge.  Musculoskeletal:  Positive for back pain.  Neurological: Negative.  Negative for dizziness and headaches.  Physical Exam   Blood pressure 112/68, pulse (!) 101, temperature 98.6 F (37 C), resp. rate 16, last menstrual period 12/14/2020, SpO2 98 %, unknown if currently breastfeeding.  Physical Exam Vitals and nursing note reviewed.  Constitutional:      General: She is not in acute distress.    Appearance: She is well-developed.  HENT:     Head: Normocephalic.  Eyes:     Pupils: Pupils are equal, round, and reactive to light.  Cardiovascular:     Rate and Rhythm: Normal rate and regular rhythm.     Heart sounds: Normal heart sounds.  Pulmonary:     Effort: Pulmonary effort is normal. No respiratory distress.     Breath sounds: Normal breath sounds.  Abdominal:     General: Bowel sounds are normal. There is no distension.     Palpations: Abdomen is soft.     Tenderness: There is no abdominal tenderness.  Skin:    General: Skin is warm and dry.  Neurological:     Mental Status: She is alert and oriented to person, place, and time.  Psychiatric:        Mood and Affect: Mood normal.        Behavior: Behavior normal.        Thought Content: Thought content normal.        Judgment: Judgment normal.   Fetal Tracing:  Baseline:  140 Variability: moderate Accels: 15x15 Decels: none  Toco: none  Dilation: Closed Effacement (%): Thick Exam by:: Sharolyn Douglas, CNM   MAU Course  Procedures Results for orders placed or performed during the hospital encounter of 07/04/21 (from the past 24 hour(s))  Urinalysis, Routine w reflex microscopic Urine, Clean Catch     Status: Abnormal   Collection Time: 07/04/21  9:16 PM  Result Value Ref Range   Color, Urine YELLOW YELLOW    APPearance CLEAR CLEAR   Specific Gravity, Urine 1.025 1.005 - 1.030   pH 6.5 5.0 - 8.0   Glucose, UA 100 (A) NEGATIVE mg/dL   Hgb urine dipstick NEGATIVE NEGATIVE   Bilirubin Urine NEGATIVE NEGATIVE   Ketones, ur 15 (A) NEGATIVE mg/dL  Protein, ur NEGATIVE NEGATIVE mg/dL   Nitrite NEGATIVE NEGATIVE   Leukocytes,Ua NEGATIVE NEGATIVE    MDM UA No signs of preterm labor at this time. Recommended IV fluids for comfort and hydration and patient declines. PO fluids given.   Recommended pregnancy support belt and discussed comfort measures at length.  Constipation relief options reviewed and colace sent to patient's pharmacy  Assessment and Plan   1. Pain of round ligament during pregnancy   2. [redacted] weeks gestation of pregnancy   3. Constipation during pregnancy in third trimester    -Discharge home in stable condition -Preterm labor precautions discussed -Patient advised to follow-up with OB as scheduled for prenatal care -Patient may return to MAU as needed or if her condition were to change or worsen   Wende Mott CNM 07/04/2021, 9:21 PM

## 2021-07-04 NOTE — MAU Note (Signed)
Pt reports for the past few weeks that she has been feeling a lot of tightness in her stomach and back pain. Pt also reports rectal pressure for the same amount of time.   Pt reports that today she started having upper abdominal pain.    Denies vaginal bleeding or LOF

## 2021-07-04 NOTE — Discharge Instructions (Signed)

## 2021-07-06 ENCOUNTER — Encounter: Payer: Medicaid Other | Admitting: Obstetrics and Gynecology

## 2021-07-06 ENCOUNTER — Other Ambulatory Visit: Payer: Medicaid Other

## 2021-07-07 ENCOUNTER — Other Ambulatory Visit: Payer: Self-pay

## 2021-07-07 NOTE — [place] Notes (Signed)
Opened in error

## 2021-07-10 ENCOUNTER — Ambulatory Visit (INDEPENDENT_AMBULATORY_CARE_PROVIDER_SITE_OTHER): Payer: Medicaid Other | Admitting: Nurse Practitioner

## 2021-07-10 ENCOUNTER — Encounter: Payer: Self-pay | Admitting: Nurse Practitioner

## 2021-07-10 ENCOUNTER — Other Ambulatory Visit: Payer: Self-pay

## 2021-07-10 ENCOUNTER — Other Ambulatory Visit: Payer: Medicaid Other

## 2021-07-10 VITALS — BP 117/83 | HR 90 | Wt 174.8 lb

## 2021-07-10 DIAGNOSIS — Z23 Encounter for immunization: Secondary | ICD-10-CM

## 2021-07-10 DIAGNOSIS — O26843 Uterine size-date discrepancy, third trimester: Secondary | ICD-10-CM

## 2021-07-10 DIAGNOSIS — Z3493 Encounter for supervision of normal pregnancy, unspecified, third trimester: Secondary | ICD-10-CM

## 2021-07-10 DIAGNOSIS — Z3A28 28 weeks gestation of pregnancy: Secondary | ICD-10-CM

## 2021-07-10 MED ORDER — ONDANSETRON 8 MG PO TBDP: 8.0000 mg | ORAL_TABLET | Freq: Three times a day (TID) | ORAL | 0 refills | Status: DC | PRN

## 2021-07-10 NOTE — Patient Instructions (Signed)
AREA PEDIATRIC/FAMILY PRACTICE PHYSICIANS  [place] ([address]) [facility] Chambliss, MD; Eniola, MD; Hale, MD; Hensel, MD; McDiarmid, MD; McIntyer, MD; Neal, MD; Walden, MD [address] [place], [place] [address] [phone] Mon-Fri 8:30-12:30, 1:30-5:00 Providers come to see babies at [facility] Accepting Medicaid [facility] providers who accept newborns: Koirala, MD; Morrow, MD; Wolters, MD [address], [place], [place] [address] [phone] Mon-Fri 8:00-5:30 Babies seen by providers at [facility] Does NOT accept Medicaid Please call early in hospitalization for appointment (limited availability)  [facility] Mulberry, MD [address] [place], [place] [address] [phone] Mon, Tue, Thur, Fri 8:30-5:00, Wed 10:00-7:00 (closed 1-2pm) Babies seen by [facility] providers Accepting Medicaid Rubin - Pediatrician Rubin, MD [address], [place], [place] [address] [phone] Mon-Fri 8:30-5:00, Sat 8:30-12:00 Provider comes to see babies at [facility] Accepting Medicaid Must have been referred from current patients or contacted office prior to delivery [facility] ([facility]) Brown, MD; Chandler, MD; Ettefagh, MD; Grant, MD; Lester, MD; McCormick, MD; McQueen, MD; Prose, MD; Simha, MD; Stanley, MD; Stryffeler, NP; Tebben, NP [address], [place], [place] [address] [phone] Mon, Tue, Thur, Fri 8:30-5:30, Wed 9:30-5:30, Sat 8:30-12:30 Babies seen by [facility] providers Accepting Medicaid Only accepting infants of first-time parents or siblings of current patients Hospital discharge coordinator will make follow-up appointment Jack Amos [address], [place], [place]  [address] [phone]   Fax - [phone] [facility] [address], [place], [place]  [address] Phone - [phone]   Fax - [phone] Shilpa Gosrani [address], [place], [place]  [address] [phone]  [place] ([address]) [facility] Bates, MD; Brassfield, MD; Cooper, Cox, MD; MD; Davis, MD; Dovico, MD; Ettefaugh, MD; Little, MD; Lowe, MD; Keiffer, MD; Melvin, MD; Sumner, MD; Williams, MD [address], [place], [place] [address] [phone] Mon-Fri 8:30-5:00 (extended evenings Mon-Thur as needed), Sat-Sun 10:00-1:00 Providers come to see babies at [facility] Accepting Medicaid for families of first-time babies and families with all children in the household age 3 and under. Must register with office prior to making appointment (M-F only). [facility] Henson, NP; Knapp, MD; Lalonde, MD; Tysinger, PA [address] [place], [place] [address] [phone] Mon-Fri 8:00-5:00 Babies seen by providers at [facility] Does NOT accept Medicaid/Commercial Insurance Only [facility] ([facility])  Artis, MD; Barnes, MD; Bratton, MD; Coccaro, MD; [place] Gardner, MD; Kramer, MD; Marshall, MD; Netherton, MD; Poleto, MD; Skinner, MD [address] [place], [place] [address] [phone] Mon-Fri 8:30-5:30, Sat (Oct.-Mar.) 9:00-1:00 Babies seen by providers at [facility] Accepting Medicaid  [place] ([address]) [facility] Reid, MD; Warner, MD [address], [place], [place] [address] [phone] Mon-Fri 8:30-5:00, Sat 8:30-12:00 Providers come to see babies at [facility] Does NOT accept Medicaid [facility] Becker, PA; Hagler, MD; Scifres, PA; Sun, MD; Swayne, MD [address], [place],  [address] [phone] Mon-Fri 8:00-5:00 Babies seen by providers at [facility] Does NOT accept Medicaid Only accepting babies of parents who  are patients Please call early in hospitalization for appointment (limited availability) [facility] Clark, MD; Frye, MD; Kelleher, MD; Mack, NP; Miller, MD; O'Keller, MD; Patterson, NP; Pudlo, MD; Puzio, MD; Thomas, MD; Tucker, MD; Twiselton, MD [address], [place], [place] [address] [phone] Mon-Fri 8:00-5:00, Sat 9:00-12:00 Providers come to see babies at [facility] Does NOT accept Medicaid  [place] ([address]) [facility] providers accepting new patients: Brake, NP; Wharton, PA [address], [place], [place] [address] [phone] Mon-Fri 8:00-5:00 Babies seen by providers at [facility] Does NOT accept Medicaid Only accepting babies of parents who are patients Please call early in hospitalization for appointment (limited availability) [facility] Gay, MD; Quinlan, MD [address] [place], [place] [address] [phone] (press [phone] to schedule appointment) Mon-Fri 8:00-5:00 Providers come to see babies at [facility] Does NOT accept Medicaid [facility] Mazer, MD [address] [place], [place] [address] [phone] Mon-Fri 8:30-5:00 (lunch 12:30-1:00), extended hours by appointment only Wed 5:00-6:30 Babies seen by [facility] providers Accepting Medicaid [facility] Banks, MD; Jordan, MD; Koberlein, MD [address], [place], [place] [address] [phone] Mon-Fri 8:00-5:00 Babies seen by [facility] providers Does NOT accept Medicaid [facility] Parker, MD; Hunter, MD; Wallace, DO [address] [place], [place] [address] [phone] Mon-Fri 8:00-5:00 Babies seen by [facility] providers Does NOT accept Medicaid [facility] Brandon, PA; Brecken, PA; Christy, NP; Dees, MD; DeClaire, MD; DeWeese, MD; Hansen, NP; Mills, NP; Parrish, NP; Smoot, NP; Summer, MD; Vapne,  MD [address] [place], [place] [address] [phone] Mon-Fri 8:30-5:00, Sat 10:00-1:00 Providers come to see babies at [facility] Does NOT accept Medicaid Free prenatal information session Tuesdays at 4:45pm [facility] Bouska, MD; Gordon, PA; Jeffery, PA; Weber, PA [address] [place] [address] [phone] Mon-Fri 7:30-5:30 Babies seen by [facility] providers [facility] [address], [place], [place]  [address] [phone]   Fax - [phone]  [place] ([address] & [address]) [facility] [place] [address] [place], [place] [address] [phone] Mon-Thur 8:00-6:00 Providers come to see babies at [facility] Accepting Medicaid [facility] Anderson, NP; Badger, MD; Beal, PA; Spencer, PA [address] [place], [place] [address] [phone] Mon-Thur 7:30-7:30, Fri 7:30-4:30 Babies seen by [facility] providers Accepting Medicaid [facility] Agbuya, MD; Klett, NP; Romgoolam, MD [address], [place], [place] [address] [phone] Mon-Fri 8:30-5:00, Sat 8:30-12:00 Providers come to see babies at [facility] Accepting Medicaid Must have "Meet & Greet" appointment at office prior to delivery [facility] McCord, MD; Wallace, MD; Wood, MD [address], [place], [place] [address] [phone] Mon-Wed 8:00-6:00, Thur-Fri 8:00-5:00, Sat 9:00-12:00 Providers come to see babies at [facility] Does NOT accept Medicaid Only accepting siblings of current patients [facility]  [address], [place], [place]  [address] [phone]   Fax - [phone] [facility] [address], [place], [place]  [address] [phone]   Fax -  [phone]  [place] ([address] & [address]) [facility] Cirigliano, DO; Matthews, DO [address] [place], [place] [address] [phone] Mon-Fri 7:00-5:00 Babies seen by [facility] providers Does NOT accept Medicaid [facility] Briscoe, MD; Howley, PA; Moreira, PA [address], [place], [place] [address] [phone] Mon-Fri 8:00-5:00 Babies seen by [facility] providers Accepting Medicaid [facility] Boyd, MD; Church, PA; Jones, NP; Osborn, PA [address], , [place] [address] [phone] Mon-Fri 8:00-5:00 Babies seen by providers at [facility] Accepting Medicaid  [place] ([address]) [facility] [place] Wendling, DO [address] [place], [place] [address] [phone] Mon-Fri 8:00-5:00 Babies seen by [facility] providers Does NOT accept Medicaid Limited availability, please call early in hospitalization to schedule follow-up [facility] Calderon, PA; Cummings, MD; Dillard, MD; Martin, PA; Olson, MD; VanDeven, PA [address], [place], [place] [address] [phone] Mon-Fri 8:30-5:00, Sat 9:00-12:00 Babies seen by providers at [facility] Accepting Medicaid Please register online then schedule online or call office [email or web address] Hunter, NP; Kumar, MD; Martin Rogers, PA [address], [place], [place] [address] [phone] Mon-Fri 8:00-5:00 Babies seen by providers at [facility] Accepting Medicaid [facility] [place], MD; Kristi Fleenor, NP; West, MD [address], [place], [place] [address] [phone] Mon-Fri 8:00-5:30, Sat&Sun by appointment (phones open at  8:30) Babies seen by [facility] providers Accepting Medicaid Must be a first-time baby or sibling of current patient [facility] [place]  [address], [place], [place]  [address] [phone]   Fax - [phone]  [place] ([address]) [facility] Brown, PA; Cowen, PA; Rice, MD; Helton, PA; Spry, MD [address] [place], [place] [address] [phone] Mon-Thur 8:00-7:00, Fri 8:00-5:00, Sat 8:00-12:00, Sun 9:00-12:00 Babies seen by [facility] providers Accepting Medicaid [facility] [place] Coe-Goins, MD; Marshall, MD; Pierre-Louis, MD [address] [place] [address], [place], [place] [address] [phone] Mon-Thur 8:00-5:00 Babies seen by providers at [facility] Accepting Medicaid [facility] Bratton, MD; Coe-Goins, MD; Hayes, MD; Lewis, MD; List, MD; Lott, MD; Marshall, MD; Moran, MD; O'Neal, MD; Pierre-Louis, MD; Pitonzo, MD; Scholer, MD; Spangle, MD [address] [place], [place] [address] [phone] Mon-Fri 8:00-5:30, Sat (Oct.-Mar.) 9:00-1:00 Babies seen by providers at [facility] Accepting Medicaid Must fill out new patient packet, available online at [email or web address] [facility] ([facility]) Friddle, NP; [place], NP; Kelly, NP; Logan, MD; Melvin, PA; Poth, MD; Ramadoss, MD; Stanton, NP [address], [place], [place] [address] [phone] Mon-Thur 8:00-5:30, Fri 8:00-5:00 Babies seen by providers at [facility] Accepting Medicaid  [place] ([address]) [facility] Dixon, PA; [place], MD; Pickard, MD; Tapia, PA [address], [place], [place] [address] [phone] Mon-Fri 8:00-5:00 Babies seen by providers at [facility] Accepting Medicaid   [place] ([address]) [facility] [place] Masneri, DO; Meyers, MD; Nelson, PA [address], [place], [place] [address] [phone] Mon-Fri 8:00-5:00 Babies seen by providers at [facility] Does NOT accept Medicaid Limited appointment availability, please call early in hospitalization   [facility] [place] Kunedd, DO; McGowen, MD [address], [place], [place] [address] [phone] Mon-Fri 8:00-5:00 Babies seen by [facility] providers Does NOT accept Medicaid [facility] [place] Cameron, MD; MacDonald, MD; Michaels, PA; Nayak, MD [address] [place] [address], [place], [place] [address] [phone] Mon-Fri 8:00-5:00 After hours clinic ([address] [place], [place] [address]) [phone] Mon-Fri 5:00-8:00, Sat 12:00-6:00, Sun 10:00-4:00 Babies seen by [facility] providers Accepting Medicaid [facility] [place] [address], [place], [place]  [address] [phone]   Fax - [phone]  [place] ([address]) [facility] [place] [facility] Andy, MD [address], [place], [place] [address] [phone] Mon-Fri 8:00-5:00 Babies seen by [facility] providers Does NOT accept Medicaid [facility] [place] ([facility] [place]) Eksir, MD [address], [place], [place] [address] [phone] Mon-Thur 8:00-7:00, Fri 8:00-5:00, Sat 8:00-12:00 Babies seen by providers at [facility] Accepting Medicaid - but does not have vaccinations in office (must be received elsewhere) Limited availability, please call early in hospitalization  [place] ([address]) [facility]  Charlene Flemming, MD [address], [place] [address] [phone]  Fax [phone]  [facility]  Kimberly Newton, MD, Annamarie Streilein, PA, Carla Hampton, PA [address] [place], [place]  [address] [phone] [facility]  [address], [place], [place] [address] [phone] [address], [place], [place] [address] [phone] (West Office)  [facility] [address], [place], [place] [address] [phone] [facility] [address] [place], [place], [place] [address] [phone] [facility] [address], [place], [place] [address] [phone] [facility] [address], [place], [place] [address] [phone] [facility] [address], [place], [place] [address] [phone] [facility] [address], [place], [place] [address] [phone] [facility]  [address], [place], [place] [address] [phone] Dr[facility] [address] [place] [address], [place], [place] [address] [phone] [facility] [address], [place], [place] [address] [phone] [facility] [address], [place],  [address] [phone]  

## 2021-07-10 NOTE — [place] Notes (Unsigned)
Per Toniann Fail in Labs:  Good Morning, This patient couldn't drink the glucose drink this morning, she spit out majority of it. So can you notate the chart so that the provider will not be looking for results. Thanks

## 2021-07-10 NOTE — Progress Notes (Signed)
° ° °  Subjective:  Rhonda Evans is a 26 y.o. G2P1001 at [redacted]w[redacted]d being seen today for ongoing prenatal care.  She is currently monitored for the following issues for this low-risk pregnancy and has Supervision of low-risk pregnancy on their problem list.  Patient reports  tired and frustrated that she had vomiting today and was not able to complete her glucola testing today  .  Contractions: Irritability. Vag. Bleeding: None.  Movement: Present. Denies leaking of fluid.   The following portions of the patient's history were reviewed and updated as appropriate: allergies, current medications, past family history, past medical history, past social history, past surgical history and problem list. Problem list updated.  Objective:   Vitals:   07/10/21 0942  BP: 117/83  Pulse: 90  Weight: 174 lb 12.8 oz (79.3 kg)    Fetal Status: Fetal Heart Rate (bpm): 145 Fundal Height: 32 cm Movement: Present     General:  Alert, oriented and cooperative. Patient is in no acute distress.  Skin: Skin is warm and dry. No rash noted.   Cardiovascular: Normal heart rate noted  Respiratory: Normal respiratory effort, no problems with respiration noted  Abdomen: Soft, gravid, appropriate for gestational age. Pain/Pressure: Present     Pelvic:  Cervical exam deferred        Extremities: Normal range of motion.  Edema: Trace  Mental Status: Normal mood and affect. Normal behavior. Normal judgment and thought content.   Urinalysis:      Assessment and Plan:  Pregnancy: G2P1001 at [redacted]w[redacted]d  1. Encounter for supervision of low-risk pregnancy in third trimester Did not complete glucola this visit due to vomiting. Will reschedule glucola and prescribed Zofran ODT 8 mg to take before her visit to reduce the likelihood of vomiting. Pediatric list given Plans epidural again.  - Tdap vaccine greater than or equal to 7yo IM - [place] MFM OB FOLLOW UP; Future  2. Uterine size-date discrepancy in third trimester Measures  greater than dates - previously on [place] baby was Breech which might be causing increased size if baby is still breech [place] ordered  3. [redacted] weeks gestation of pregnancy   Preterm labor symptoms and general obstetric precautions including but not limited to vaginal bleeding, contractions, leaking of fluid and fetal movement were reviewed in detail with the patient. Please refer to After Visit Summary for other counseling recommendations.  Return in about 2 weeks (around 07/24/2021) for in person ROB.  Earlie Server, RN, MSN, NP-BC Nurse Practitioner, [facility] for [employer] [employer] 07/10/2021 11:07 AM

## 2021-07-11 LAB — CBC
Hematocrit: 27.6 % — ABNORMAL LOW (ref 34.0–46.6)
Hemoglobin: 9 g/dL — ABNORMAL LOW (ref 11.1–15.9)
MCH: 23.3 pg — ABNORMAL LOW (ref 26.6–33.0)
MCHC: 32.6 g/dL (ref 31.5–35.7)
MCV: 72 fL — ABNORMAL LOW (ref 79–97)
Platelets: 290 10*3/uL (ref 150–450)
RBC: 3.86 x10E6/uL (ref 3.77–5.28)
RDW: 15.8 % — ABNORMAL HIGH (ref 11.7–15.4)
WBC: 6.2 10*3/uL (ref 3.4–10.8)

## 2021-07-11 LAB — HIV ANTIBODY (ROUTINE TESTING W REFLEX): HIV Screen 4th Generation wRfx: NONREACTIVE

## 2021-07-11 LAB — RPR: RPR Ser Ql: NONREACTIVE

## 2021-07-12 ENCOUNTER — Encounter: Payer: Self-pay | Admitting: Nurse Practitioner

## 2021-07-12 DIAGNOSIS — O2341 Unspecified infection of urinary tract in pregnancy, first trimester: Secondary | ICD-10-CM

## 2021-07-12 DIAGNOSIS — D509 Iron deficiency anemia, unspecified: Secondary | ICD-10-CM | POA: Insufficient documentation

## 2021-07-12 DIAGNOSIS — O99013 Anemia complicating pregnancy, third trimester: Secondary | ICD-10-CM | POA: Insufficient documentation

## 2021-07-12 HISTORY — DX: Unspecified infection of urinary tract in pregnancy, first trimester: O23.41

## 2021-07-12 MED ORDER — FERROUS SULFATE 325 (65 FE) MG PO TABS: 325.0000 mg | ORAL_TABLET | Freq: Every day | ORAL | 3 refills | Status: DC

## 2021-07-12 NOTE — Addendum Note (Signed)
Addended by: Currie Paris on: 07/12/2021 08:18 AM   Modules accepted: Orders

## 2021-07-14 ENCOUNTER — Other Ambulatory Visit: Payer: Self-pay

## 2021-07-14 ENCOUNTER — Other Ambulatory Visit: Payer: Medicaid Other

## 2021-07-14 DIAGNOSIS — R87612 Low grade squamous intraepithelial lesion on cytologic smear of cervix (LGSIL): Secondary | ICD-10-CM

## 2021-07-14 DIAGNOSIS — Z3493 Encounter for supervision of normal pregnancy, unspecified, third trimester: Secondary | ICD-10-CM

## 2021-07-15 LAB — GLUCOSE TOLERANCE, 2 HOURS W/ 1HR
Glucose, 1 hour: 127 mg/dL (ref 70–179)
Glucose, 2 hour: 113 mg/dL (ref 70–152)
Glucose, Fasting: 69 mg/dL — ABNORMAL LOW (ref 70–91)

## 2021-07-15 LAB — CBC
Hematocrit: 29 % — ABNORMAL LOW (ref 34.0–46.6)
Hemoglobin: 9.3 g/dL — ABNORMAL LOW (ref 11.1–15.9)
MCH: 22.7 pg — ABNORMAL LOW (ref 26.6–33.0)
MCHC: 32.1 g/dL (ref 31.5–35.7)
MCV: 71 fL — ABNORMAL LOW (ref 79–97)
Platelets: 293 10*3/uL (ref 150–450)
RBC: 4.1 x10E6/uL (ref 3.77–5.28)
RDW: 15.9 % — ABNORMAL HIGH (ref 11.7–15.4)
WBC: 7.1 10*3/uL (ref 3.4–10.8)

## 2021-07-15 LAB — HIV ANTIBODY (ROUTINE TESTING W REFLEX): HIV Screen 4th Generation wRfx: NONREACTIVE

## 2021-07-15 LAB — RPR: RPR Ser Ql: NONREACTIVE

## 2021-07-18 ENCOUNTER — Telehealth: Payer: Self-pay | Admitting: Family Medicine

## 2021-07-18 NOTE — Telephone Encounter (Signed)
Called pt to follow up. Phone numbers updated in demographics. Pt states she felt something coming out of rectum. This is causing some pain. States she has bowel movement every day or every other day. Encouraged pt to try OTC Tucks Pads and Anusol ointment for relief. Encouraged Miralax PRN for daily soft BM. Pt agreeable, will assess at next appt. Pt requests ultrasound be scheduled. Per Nolene Bernheim, NP note pt was to be scheduled for follow up growth due to size greater than dates at last appt. [place] appt scheduled.

## 2021-07-18 NOTE — Telephone Encounter (Signed)
Patient called in on her boyfriends phone number which is [phone], she was saying she is seeing something hanging out of her bottom, she said it is not poop and might be a hemorrhoid she is not sure but she really really wants to speak with a nurse. She is requesting the call back on her boyfriends number as well.

## 2021-07-20 ENCOUNTER — Ambulatory Visit: Payer: Medicaid Other | Admitting: *Deleted

## 2021-07-20 ENCOUNTER — Other Ambulatory Visit: Payer: Self-pay

## 2021-07-20 ENCOUNTER — Ambulatory Visit: Payer: Medicaid Other | Attending: Nurse Practitioner

## 2021-07-20 VITALS — BP 121/70 | HR 94

## 2021-07-20 DIAGNOSIS — Z3A30 30 weeks gestation of pregnancy: Secondary | ICD-10-CM | POA: Diagnosis not present

## 2021-07-20 DIAGNOSIS — O26843 Uterine size-date discrepancy, third trimester: Secondary | ICD-10-CM | POA: Insufficient documentation

## 2021-07-20 DIAGNOSIS — O321XX0 Maternal care for breech presentation, not applicable or unspecified: Secondary | ICD-10-CM | POA: Diagnosis not present

## 2021-07-20 DIAGNOSIS — Z3493 Encounter for supervision of normal pregnancy, unspecified, third trimester: Secondary | ICD-10-CM

## 2021-07-20 DIAGNOSIS — Z148 Genetic carrier of other disease: Secondary | ICD-10-CM | POA: Insufficient documentation

## 2021-07-20 NOTE — Progress Notes (Signed)
Pt states she has felt a decrease in intensity as well as frequency over the past 2 weeks.

## 2021-07-21 ENCOUNTER — Other Ambulatory Visit: Payer: Self-pay | Admitting: *Deleted

## 2021-07-21 DIAGNOSIS — O26849 Uterine size-date discrepancy, unspecified trimester: Secondary | ICD-10-CM

## 2021-07-24 ENCOUNTER — Ambulatory Visit (INDEPENDENT_AMBULATORY_CARE_PROVIDER_SITE_OTHER): Payer: Medicaid Other | Admitting: Obstetrics & Gynecology

## 2021-07-24 ENCOUNTER — Other Ambulatory Visit: Payer: Self-pay

## 2021-07-24 VITALS — BP 110/74 | HR 92 | Wt 175.0 lb

## 2021-07-24 DIAGNOSIS — O99013 Anemia complicating pregnancy, third trimester: Secondary | ICD-10-CM

## 2021-07-24 DIAGNOSIS — Z3493 Encounter for supervision of normal pregnancy, unspecified, third trimester: Secondary | ICD-10-CM

## 2021-07-24 MED ORDER — FERROUS SULFATE 325 (65 FE) MG PO TABS: 325.0000 mg | ORAL_TABLET | ORAL | 2 refills | Status: DC

## 2021-07-24 MED ORDER — PANTOPRAZOLE SODIUM 20 MG PO TBEC: 20.0000 mg | DELAYED_RELEASE_TABLET | Freq: Every day | ORAL | 2 refills | Status: DC

## 2021-07-24 NOTE — Progress Notes (Signed)
Patient reports cramping in lower abdomen along with lower back pain

## 2021-07-24 NOTE — Progress Notes (Addendum)
Subjective:    Rhonda Evans is a 26 y.o. G2P1001 [redacted]w[redacted]d being seen today for her obstetrical visit. Patient says she is doing well. Patient reports heartburn but denies nausea. No other complaints. Fetal movement: normal.  Objective:    BP 110/74    Pulse 92    Wt 175 lb (79.4 kg)    LMP 12/14/2020    BMI 28.25 kg/m   Physical Exam Vitals and nursing note reviewed.  Constitutional:      Appearance: Normal appearance.  HENT:     Head: Normocephalic and atraumatic.  Pulmonary:     Effort: Pulmonary effort is normal. No respiratory distress.  Abdominal:     Palpations: Abdomen is soft.     Tenderness: There is no abdominal tenderness.  Skin:    General: Skin is warm and dry.  Neurological:     General: No focal deficit present.     Mental Status: She is alert.  Psychiatric:        Mood and Affect: Mood normal.    Maternal Exam:  Abdomen: Patient reports no abdominal tenderness. Fundal height is 31cm.     FHT: Fetal Heart Rate (bpm): 145  Uterine Size: Fundal Height: 31 cm        Assessment:    Pregnancy:  G2P1001 [redacted]w[redacted]d   Rhonda Evans is a 25yo G2P1 at [redacted]w[redacted]d who presented for an obstetrical visit as part of routine prenatal care. She expressed no concerns. She has had heartburn and was not informed that she needed to take iron for her anemia, so prescriptions will be sent for ferrous sulfate and Protonix.  Plan:     Pregnancy:  G2P1001 [redacted]w[redacted]d Low-risk pregnancy in third trimester - Continue routine follow-up Anemia in pregnancy - Ferrous sulfate 325mg  qod - Will continue to monitor Heartburn - Protonix 20mg  daily   Follow up in 2 Weeks.    , MS3 07/24/2021 Attestation of Attending Supervision of Medical Student: Evaluation and management procedures were performed by the medical student under my supervision and collaboration.  I have reviewed the student's note and chart, and I agree with the management and plan.  Altamease Oiler, MD, FACOG Attending  Obstetrician & Gynecologist Faculty Practice, [facility]

## 2021-07-27 DIAGNOSIS — R87612 Low grade squamous intraepithelial lesion on cytologic smear of cervix (LGSIL): Secondary | ICD-10-CM | POA: Insufficient documentation

## 2021-07-27 HISTORY — DX: Low grade squamous intraepithelial lesion on cytologic smear of cervix (LGSIL): R87.612

## 2021-08-08 ENCOUNTER — Ambulatory Visit (INDEPENDENT_AMBULATORY_CARE_PROVIDER_SITE_OTHER): Payer: Medicaid Other | Admitting: Nurse Practitioner

## 2021-08-08 ENCOUNTER — Encounter: Payer: Self-pay | Admitting: Family Medicine

## 2021-08-08 ENCOUNTER — Other Ambulatory Visit: Payer: Self-pay

## 2021-08-08 VITALS — BP 120/85 | HR 90 | Wt 180.1 lb

## 2021-08-08 DIAGNOSIS — O368130 Decreased fetal movements, third trimester, not applicable or unspecified: Secondary | ICD-10-CM

## 2021-08-08 DIAGNOSIS — Z3A33 33 weeks gestation of pregnancy: Secondary | ICD-10-CM

## 2021-08-08 DIAGNOSIS — Z3493 Encounter for supervision of normal pregnancy, unspecified, third trimester: Secondary | ICD-10-CM

## 2021-08-08 MED ORDER — COMFORT FIT MATERNITY SUPP MED MISC: 1.0000 | Freq: Once | 0 refills | Status: AC
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
## 2021-08-08 NOTE — Patient Instructions (Signed)
AREA PEDIATRIC/FAMILY PRACTICE PHYSICIANS  [place] ([address]) [facility] Chambliss, MD; Eniola, MD; Hale, MD; Hensel, MD; McDiarmid, MD; McIntyer, MD; Neal, MD; Walden, MD [address] [place], [place] [address] [phone] Mon-Fri 8:30-12:30, 1:30-5:00 Providers come to see babies at [facility] Accepting Medicaid [facility] providers who accept newborns: Koirala, MD; Morrow, MD; Wolters, MD [address], [place], [place] [address] [phone] Mon-Fri 8:00-5:30 Babies seen by providers at [facility] Does NOT accept Medicaid Please call early in hospitalization for appointment (limited availability)  [facility] Mulberry, MD [address] [place], [place] [address] [phone] Mon, Tue, Thur, Fri 8:30-5:00, Wed 10:00-7:00 (closed 1-2pm) Babies seen by [facility] providers Accepting Medicaid Rubin - Pediatrician Rubin, MD [address], [place], [place] [address] [phone] Mon-Fri 8:30-5:00, Sat 8:30-12:00 Provider comes to see babies at [facility] Accepting Medicaid Must have been referred from current patients or contacted office prior to delivery [facility] ([facility]) Brown, MD; Chandler, MD; Ettefagh, MD; [place], MD; Lester, MD; McCormick, MD; McQueen, MD; Prose, MD; Simha, MD; Stanley, MD; Stryffeler, NP; Tebben, NP [address], [place], [place] [address] [phone] Mon, Tue, Thur, Fri 8:30-5:30, Wed 9:30-5:30, Sat 8:30-12:30 Babies seen by [facility] providers Accepting Medicaid Only accepting infants of first-time parents or siblings of current patients Hospital discharge coordinator will make follow-up appointment Jack Amos [address], [place], [place]  [address] [phone]   Fax - [phone] [facility] [address], [place], [place]  [address] Phone - [phone]   Fax - [phone] Shilpa Gosrani [address], [place], [place]  [address] [phone]  [place] ([address])  [facility] Bates, MD; Brassfield, MD; Cooper, Cox, MD; MD; Davis, MD; Dovico, MD; Ettefaugh, MD; Little, MD; Lowe, MD; Keiffer, MD; Melvin, MD; Sumner, MD; Williams, MD [address], [place], [place] [address] [phone] Mon-Fri 8:30-5:00 (extended evenings Mon-Thur as needed), Sat-Sun 10:00-1:00 Providers come to see babies at [facility] Accepting Medicaid for families of first-time babies and families with all children in the household age 3 and under. Must register with office prior to making appointment (M-F only). [facility] Henson, NP; Knapp, MD; Lalonde, MD; Tysinger, PA [address] [place], [place] [address] [phone] Mon-Fri 8:00-5:00 Babies seen by providers at [facility] Does NOT accept Medicaid/Commercial Insurance Only [facility] ([facility])  Artis, MD; Barnes, MD; Bratton, MD; Coccaro, MD; Lockett Gardner, MD; Kramer, MD; Marshall, MD; Netherton, MD; Poleto, MD; Skinner, MD [address] [place], [place] [address] [phone] Mon-Fri 8:30-5:30, Sat (Oct.-Mar.) 9:00-1:00 Babies seen by providers at [facility] Accepting Medicaid  [place] ([address]) [facility] Reid, MD; Warner, MD [address], [place], [place] [address] [phone] Mon-Fri 8:30-5:00, Sat 8:30-12:00 Providers come to see babies at [facility] Does NOT accept Medicaid [facility] Becker, PA; Hagler, MD; Scifres, PA; Sun, MD; Swayne, MD [address], [place],  [address] [phone] Mon-Fri 8:00-5:00 Babies seen by providers at [facility] Does NOT accept Medicaid Only accepting babies of parents who  are patients Please call early in hospitalization for appointment (limited availability) [facility] Clark, MD; Frye, MD; Kelleher, MD; Mack, NP; Miller, MD; O'Keller, MD; Patterson, NP; Pudlo, MD; Puzio, MD; Thomas, MD; Tucker, MD; Twiselton, MD [address], [place], [place] [address] [phone] Mon-Fri 8:00-5:00, Sat 9:00-12:00 Providers come to see babies at [facility] Does NOT accept Medicaid  [place] ([address]) [facility] providers accepting new patients: Brake, NP; Wharton, PA [address], [place], [place] [address] [phone] Mon-Fri 8:00-5:00 Babies seen by providers at [facility] Does NOT accept Medicaid Only accepting babies of parents who are patients Please call early in hospitalization for appointment (limited availability) [facility] Gay, MD; Quinlan, MD [address] [place], [place] [address] [phone] (press 1 to schedule appointment) Mon-Fri 8:00-5:00 Providers come to see babies at [facility] Does NOT accept Medicaid [facility] Mazer, MD [address] [place], [place] [address] [phone] Mon-Fri 8:30-5:00 (lunch 12:30-1:00), extended hours by appointment only Wed 5:00-6:30 Babies seen by [facility] providers Accepting Medicaid [facility] Banks, MD; Jordan, MD; Koberlein, MD [address], [place], [place] [address] [phone] Mon-Fri 8:00-5:00 Babies seen by [facility] providers Does NOT accept Medicaid [facility] Parker, MD; Hunter, MD; Wallace, DO [address] [place], [place] [address] [phone] Mon-Fri 8:00-5:00 Babies seen by [facility] providers Does NOT accept Medicaid [facility] Brandon, PA; Brecken, PA; Christy, NP; Dees, MD; DeClaire, MD; DeWeese, MD; Hansen, NP; Mills, NP; Parrish, NP; Smoot, NP; Summer, MD; Vapne,  MD [address] [place], [place] [address] [phone] Mon-Fri 8:30-5:00, Sat 10:00-1:00 Providers come to see babies at [facility] Does NOT accept Medicaid Free prenatal information session Tuesdays at 4:45pm [facility] Bouska, MD; Gordon, PA; Jeffery, PA; Weber, PA [address] [place] [address] [phone] Mon-Fri 7:30-5:30 Babies seen by [facility] providers [facility] [address], [place], [place]  [address] [phone]   Fax - [phone]  [place] ([address] & [address]) [facility] [place] [address] [place], [place] [address] [phone] Mon-Thur 8:00-6:00 Providers come to see babies at [facility] Accepting Medicaid [facility] Anderson, NP; Badger, MD; Beal, PA; Spencer, PA [address] [place], [place] [address] [phone] Mon-Thur 7:30-7:30, Fri 7:30-4:30 Babies seen by [facility] providers Accepting Medicaid [facility] Agbuya, MD; Klett, NP; Romgoolam, MD [address], [place], [place] [address] [phone] Mon-Fri 8:30-5:00, Sat 8:30-12:00 Providers come to see babies at [facility] Accepting Medicaid Must have "Meet & Greet" appointment at office prior to delivery [facility] [place] McCord, MD; Wallace, MD; Wood, MD [address], [place], [place] [address] [phone] Mon-Wed 8:00-6:00, Thur-Fri 8:00-5:00, Sat 9:00-12:00 Providers come to see babies at [facility] Does NOT accept Medicaid Only accepting siblings of current patients [facility]  [address], [place], [place]  [address] [phone]   Fax - [phone] [facility] [address], [place], [place]  [address] [phone]   Fax -  [phone]  [place] ([address] & [address]) [facility] Cirigliano, DO; Matthews, DO [address] [place], [place] [address] [phone] Mon-Fri 7:00-5:00 Babies seen by [facility] providers Does NOT accept Medicaid [facility] Briscoe, MD; Howley, PA; Moreira, PA [address], [place],  [address] [phone] Mon-Fri 8:00-5:00 Babies seen by [facility] providers Accepting Medicaid [facility] Boyd, MD; Church, PA; Jones, NP; Osborn, PA [address], [place],  [address] [phone] Mon-Fri 8:00-5:00 Babies seen by providers at [facility] Accepting Medicaid  [place] ([address]) [facility] [place] Wendling, DO [address] [place], [place] [address] [phone] Mon-Fri 8:00-5:00 Babies seen by [facility] providers Does NOT accept Medicaid Limited availability, please call early in hospitalization to schedule follow-up [facility] Calderon, PA; Cummings, MD; Dillard, MD; Martin, PA; Olson, MD; VanDeven, PA [address], [place], [place] [address] [phone] Mon-Fri 8:30-5:00, Sat 9:00-12:00 Babies seen by providers at [facility] Accepting Medicaid Please register online then schedule online or call office [email or web address] Hunter, NP; Kumar, MD; Martin Rogers, PA [address], [place], [place] [address] [phone] Mon-Fri 8:00-5:00 Babies seen by providers at [facility] Accepting Medicaid [facility] ([facility]) Suffolk, MD; Kristi Fleenor, NP; West, MD [address], [place], [place] [address] [phone] Mon-Fri 8:00-5:30, Sat&Sun by appointment (phones open at  8:30) Babies seen by [facility] providers Accepting Medicaid Must be a first-time baby or sibling of current patient [facility] [place]  [address], [place], [place]  [address] [phone]   Fax - [phone]  [place] ([address]) [facility] Brown, PA; Cowen, PA; Rice, MD; Helton, PA; Spry, MD [address] [place], [place] [address] [phone] Mon-Thur 8:00-7:00, Fri 8:00-5:00, Sat 8:00-12:00, Sun 9:00-12:00 Babies seen by [facility] providers Accepting Medicaid [facility] Coe-Goins, MD; Marshall, MD; Pierre-Louis, MD [address] [place], [place], [place] [address] [phone] Mon-Thur 8:00-5:00 Babies seen by providers at [facility] Accepting Medicaid [facility] Bratton, MD; Coe-Goins, MD; Hayes, MD; Lewis, MD; List, MD; Lott, MD; Marshall, MD; Moran, MD; O'Neal, MD; Pierre-Louis, MD; Pitonzo, MD; Scholer, MD; Spangle, MD [address] [place], [place] [address] [phone] Mon-Fri 8:00-5:30, Sat (Oct.-Mar.) 9:00-1:00 Babies seen by providers at [facility] Accepting Medicaid Must fill out new patient packet, available online at [email or web address] [facility] ([facility]) Friddle, NP; Harris, NP; Kelly, NP; Logan, MD; Melvin, PA; Poth, MD; Ramadoss, MD; Stanton, NP [address], [place], [place] [address] [phone] Mon-Thur 8:00-5:30, Fri 8:00-5:00 Babies seen by providers at [facility] Accepting Medicaid  [place] ([address]) [facility] Dixon, PA; [place], MD; Pickard, MD; Tapia, PA [address], [place], [place] [address] [phone] Mon-Fri 8:00-5:00 Babies seen by providers at [facility] Accepting Medicaid   [place] ([address]) [facility] [place] Masneri, DO; Meyers, MD; Nelson, PA [address], [place], [place] [address] [phone] Mon-Fri 8:00-5:00 Babies seen by providers at [facility] Does NOT accept Medicaid Limited appointment availability, please call early in hospitalization  [facility] [place] Kunedd, DO; McGowen, MD [address], [place], [place] [address] [phone] Mon-Fri 8:00-5:00 Babies seen by [facility] providers Does NOT accept Medicaid [facility] [place] Cameron, MD; MacDonald, MD; Michaels, PA; Nayak, MD [address] [place] [address], [place], [place] [address] [phone] Mon-Fri 8:00-5:00 After hours clinic ([address] [place], [place] [address]) [phone] Mon-Fri 5:00-8:00, Sat 12:00-6:00, Sun 10:00-4:00 Babies seen by [facility] providers Accepting Medicaid [facility] [place] [address], [place], [place]  [address] [phone]   Fax - [phone]  [place] ([address]) [facility] [place] [facility] Andy, MD [address], [place], [place] [address] [phone] Mon-Fri 8:00-5:00 Babies seen by [facility] providers Does NOT accept Medicaid [facility] [place] ([facility] [place]) Eksir, MD [address], [place], [place] [address] [phone] Mon-Thur 8:00-7:00, Fri 8:00-5:00, Sat 8:00-12:00 Babies seen by providers at [facility] Accepting Medicaid - but does not have vaccinations in office (must be received elsewhere) Limited availability, please call early in hospitalization  [place] ([address]) [facility]  Charlene Flemming, MD [address], [place] [address] [phone]  Fax [phone]  [facility]  Kimberly Newton, MD, Annamarie Streilein, PA, Carla Hampton, PA [address] [place] [place], [place]  [address] [phone] [facility]  [address], [place], [place] [address] [phone] [address], [place], [place] [address] [phone] (West Office)  [facility] [address], [place], [place] [address] [phone] [facility] [address], [place], [place] [address] [phone] [facility] [address], [place], [place] [address] [phone] [facility] [address], [place], [place] [address] [phone] [facility] [address], [place], [place] [address] [phone] [facility] [address], [place], [place] [address] [phone] [facility]  [address], [place], [place] [address] [phone] Dr[facility] [address], [place], [place] [address] [phone] [facility] [address], [place], [place] [address] [phone] [facility] [address], [place],  [address] [phone]  

## 2021-08-17 ENCOUNTER — Other Ambulatory Visit: Payer: Self-pay

## 2021-08-17 ENCOUNTER — Ambulatory Visit: Payer: Medicaid Other | Attending: Obstetrics

## 2021-08-17 ENCOUNTER — Ambulatory Visit: Payer: Medicaid Other | Admitting: *Deleted

## 2021-08-17 VITALS — BP 119/71 | HR 102

## 2021-08-17 DIAGNOSIS — O26843 Uterine size-date discrepancy, third trimester: Secondary | ICD-10-CM | POA: Diagnosis present

## 2021-08-17 DIAGNOSIS — Z3A34 34 weeks gestation of pregnancy: Secondary | ICD-10-CM

## 2021-08-17 DIAGNOSIS — Z362 Encounter for other antenatal screening follow-up: Secondary | ICD-10-CM | POA: Diagnosis not present

## 2021-08-17 DIAGNOSIS — Z3493 Encounter for supervision of normal pregnancy, unspecified, third trimester: Secondary | ICD-10-CM

## 2021-08-17 DIAGNOSIS — O26849 Uterine size-date discrepancy, unspecified trimester: Secondary | ICD-10-CM

## 2021-08-20 ENCOUNTER — Encounter (HOSPITAL_COMMUNITY): Payer: Self-pay | Admitting: Obstetrics & Gynecology

## 2021-08-20 ENCOUNTER — Other Ambulatory Visit: Payer: Self-pay

## 2021-08-20 ENCOUNTER — Inpatient Hospital Stay (HOSPITAL_COMMUNITY)
Admission: AD | Admit: 2021-08-20 | Discharge: 2021-08-21 | Disposition: A | Discharge status: Home / Self Care | Payer: Medicaid Other | Attending: Obstetrics & Gynecology | Admitting: Obstetrics & Gynecology

## 2021-08-20 DIAGNOSIS — O99891 Other specified diseases and conditions complicating pregnancy: Secondary | ICD-10-CM

## 2021-08-20 DIAGNOSIS — R109 Unspecified abdominal pain: Secondary | ICD-10-CM

## 2021-08-20 DIAGNOSIS — O26893 Other specified pregnancy related conditions, third trimester: Secondary | ICD-10-CM | POA: Insufficient documentation

## 2021-08-20 DIAGNOSIS — K5900 Constipation, unspecified: Secondary | ICD-10-CM | POA: Insufficient documentation

## 2021-08-20 DIAGNOSIS — M545 Low back pain, unspecified: Secondary | ICD-10-CM | POA: Insufficient documentation

## 2021-08-20 DIAGNOSIS — M549 Dorsalgia, unspecified: Secondary | ICD-10-CM

## 2021-08-20 DIAGNOSIS — Z3A34 34 weeks gestation of pregnancy: Secondary | ICD-10-CM | POA: Diagnosis not present

## 2021-08-20 DIAGNOSIS — O99613 Diseases of the digestive system complicating pregnancy, third trimester: Secondary | ICD-10-CM

## 2021-08-20 LAB — URINALYSIS, ROUTINE W REFLEX MICROSCOPIC
Bilirubin Urine: NEGATIVE
Glucose, UA: NEGATIVE mg/dL
Hgb urine dipstick: NEGATIVE
Ketones, ur: 5 mg/dL — AB
Leukocytes,Ua: NEGATIVE
Nitrite: NEGATIVE
Protein, ur: NEGATIVE mg/dL
Specific Gravity, Urine: 1.026 (ref 1.005–1.030)
pH: 6 (ref 5.0–8.0)

## 2021-08-20 MED ORDER — NIFEDIPINE 10 MG PO CAPS
10.0000 mg | ORAL_CAPSULE | ORAL | Status: DC
  Administered 2021-08-20 – 2021-08-21 (×2): 10 mg via ORAL
  Filled 2021-08-20 (×2): qty 1

## 2021-08-20 MED ORDER — CYCLOBENZAPRINE HCL 5 MG PO TABS
10.0000 mg | ORAL_TABLET | Freq: Once | ORAL | Status: AC
  Administered 2021-08-20: 10 mg via ORAL
  Filled 2021-08-20: qty 2

## 2021-08-20 NOTE — MAU Note (Signed)
Pt reports to MAU c/o pressure in lower abd and lower back that started around 1600.  No VB or LOF.  +FM

## 2021-08-20 NOTE — MAU Provider Note (Signed)
History     CSN: [id number]  Arrival date and time: 08/20/21 2237   Event Date/Time   First Provider Initiated Contact with Patient 08/20/21 2315      Chief Complaint  Patient presents with   Pressure   Back Pain   Ms. Rhonda Evans is a 26 y.o. year old G69P1001 female at 73w5dweeks gestation who presents to MAU reporting lower abdominal and lower back pain; "feels like contractions" since 1600 today. She denies any VB or LOF. She reports good (+) FM. She denies eating vegetables in her diet. She reports only drinking about 3 bottles of water in a day. She cannot recall the last time she had a BM. She has not taken any medications for her complaints today. [facility] next appt is 08/22/2021. Her aunt is present and contributing to the history taking.     OB History     Gravida  2   Para  1   Term  1   Preterm      AB      Living  1      SAB      IAB      Ectopic      Multiple      Live Births  1           Past Medical History:  Diagnosis Date   Anxiety    Depression    Medical history non-contributory     Past Surgical History:  Procedure Laterality Date   NO PAST SURGERIES      Family History  Problem Relation Age of Onset   Ovarian cancer Mother    Ovarian cancer Sister    Hypertension Maternal Aunt    Diabetes Maternal Grandmother        great   Hypertension Maternal Grandmother        great   Diabetes Maternal Grandfather    Hypertension Maternal Grandfather    Cancer Maternal Grandfather    Cancer Paternal Grandfather    Hearing loss Neg Hx     Social History   Tobacco Use   Smoking status: Never   Smokeless tobacco: Never  Vaping Use   Vaping Use: Former  Substance Use Topics   Alcohol use: No   Drug use: No    Allergies:  Allergies  Allergen Reactions   Amoxicillin Anaphylaxis and Rash    Has patient had a PCN reaction causing immediate rash, facial/tongue/throat swelling, SOB or lightheadedness with  hypotension: Yes Has patient had a PCN reaction causing severe rash involving mucus membranes or skin necrosis: No Has patient had a PCN reaction that required hospitalization Yes Has patient had a PCN reaction occurring within the last 10 years: No If all of the above answers are "NO", then may proceed with Cephalosporin use.    Latex Rash    Medications Prior to Admission  Medication Sig Dispense Refill Last Dose   Blood Pressure Monitoring (BLOOD PRESSURE KIT) DEVI 1 Device by Does not apply route as needed. 1 each 0    ferrous sulfate 325 (65 FE) MG tablet Take 1 tablet (325 mg total) by mouth every other day. 30 tablet 2    pantoprazole (PROTONIX) 20 MG tablet Take 1 tablet (20 mg total) by mouth daily. (Patient not taking: Reported on 08/17/2021) 30 tablet 2    Prenatal 27-1 MG TABS Take 1 tablet by mouth daily. 30 tablet 12     Review of Systems  Constitutional: Negative.   °HENT: Negative.    °Eyes: Negative.   °Respiratory: Negative.    °Cardiovascular: Negative.   °Gastrointestinal:  Positive for constipation.  °Endocrine: Negative.   °Genitourinary:  Positive for pelvic pain (contractions).  °Musculoskeletal:  Positive for back pain.  °Skin: Negative.   °Allergic/Immunologic: Negative.   °Neurological: Negative.   °Hematological: Negative.   °Psychiatric/Behavioral: Negative.    ° °Physical Exam  ° °Blood pressure 119/70, pulse (!) 103, temperature 98.7 °F (37.1 °C), temperature source Oral, resp. rate 18, height 5' 6" (1.676 m), weight 82.1 kg, last menstrual period 12/14/2020, SpO2 100 %, unknown if currently breastfeeding. ° °Physical Exam °Vitals and nursing note reviewed. Exam conducted with a chaperone present.  °Constitutional:   °   Appearance: Normal appearance. She is normal weight.  °Cardiovascular:  °   Rate and Rhythm: Tachycardia present.  °Pulmonary:  °   Effort: Pulmonary effort is normal.  °Abdominal:  °   General: There is no distension.  °   Palpations: Abdomen is soft.  There is no mass.  °   Tenderness: There is no abdominal tenderness. There is no right CVA tenderness, left CVA tenderness, guarding or rebound.  °   Hernia: No hernia is present.  °Genitourinary: °   General: Normal vulva.  °   Comments: Dilation: Closed °Effacement (%): Thick °Cervical Position: Posterior °Exam by:: Rolitta Dawson CNM °Large amount of hard stool palpable during VE °Skin: °   General: Skin is warm and dry.  °Neurological:  °   Mental Status: She is alert and oriented to person, place, and time.  °Psychiatric:     °   Mood and Affect: Mood normal.     °   Behavior: Behavior normal.     °   Thought Content: Thought content normal.     °   Judgment: Judgment normal.  ° °REACTIVE NST - FHR: 140 bpm / moderate variability / accels present / decels absent / TOCO: regular every 4-6 mins  °MAU Course  °Procedures ° °MDM °CCUA °UCx -- Results pending  °Procardia 10 mg every 20 mins x 3 doses °Flexeril 10 mg -- improved pain ° °Results for orders placed or performed during the hospital encounter of 08/20/21 (from the past 24 hour(s))  °Urinalysis, Routine w reflex microscopic Urine, Clean Catch     Status: Abnormal  ° Collection Time: 08/20/21 11:33 PM  °Result Value Ref Range  ° Color, Urine YELLOW YELLOW  ° APPearance HAZY (A) CLEAR  ° Specific Gravity, Urine 1.026 1.005 - 1.030  ° pH 6.0 5.0 - 8.0  ° Glucose, UA NEGATIVE NEGATIVE mg/dL  ° Hgb urine dipstick NEGATIVE NEGATIVE  ° Bilirubin Urine NEGATIVE NEGATIVE  ° Ketones, ur 5 (A) NEGATIVE mg/dL  ° Protein, ur NEGATIVE NEGATIVE mg/dL  ° Nitrite NEGATIVE NEGATIVE  ° Leukocytes,Ua NEGATIVE NEGATIVE  °  °Assessment and Plan  °Abdominal pain during pregnancy in third trimester  °- Information provided on abdominal pain in pregnancy °  °Back pain affecting pregnancy in third trimester °- Rx for Flexeril 10 mg BID prn for back pain °- Information provided on back pain in pregnancy °  °[redacted] weeks gestation of pregnancy ° °Constipation during pregnancy in third  trimester °- Rx for Colace 100 mg BID prn constipation °- Information provided on constipation °  °- Discharge patient °- Keep scheduled appt with [facility] on 08/22/2021 °- Patient verbalized an understanding of the plan of care and agrees.  ° ° °Rolitta   Dawson, CNM °08/20/2021, 11:15 PM  °

## 2021-08-21 DIAGNOSIS — R109 Unspecified abdominal pain: Secondary | ICD-10-CM

## 2021-08-21 DIAGNOSIS — O26893 Other specified pregnancy related conditions, third trimester: Secondary | ICD-10-CM

## 2021-08-21 DIAGNOSIS — Z3A34 34 weeks gestation of pregnancy: Secondary | ICD-10-CM

## 2021-08-21 MED ORDER — DOCUSATE SODIUM 100 MG PO CAPS: 100.0000 mg | ORAL_CAPSULE | Freq: Two times a day (BID) | ORAL | 0 refills | Status: DC | PRN

## 2021-08-21 MED ORDER — CYCLOBENZAPRINE HCL 10 MG PO TABS: 10.0000 mg | ORAL_TABLET | Freq: Two times a day (BID) | ORAL | 0 refills | Status: DC | PRN

## 2021-08-22 ENCOUNTER — Other Ambulatory Visit: Payer: Self-pay

## 2021-08-22 ENCOUNTER — Ambulatory Visit (INDEPENDENT_AMBULATORY_CARE_PROVIDER_SITE_OTHER): Payer: Medicaid Other | Admitting: Nurse Practitioner

## 2021-08-22 VITALS — BP 113/76 | HR 100 | Wt 181.0 lb

## 2021-08-22 DIAGNOSIS — Z3A35 35 weeks gestation of pregnancy: Secondary | ICD-10-CM

## 2021-08-22 DIAGNOSIS — Z3493 Encounter for supervision of normal pregnancy, unspecified, third trimester: Secondary | ICD-10-CM

## 2021-08-22 DIAGNOSIS — O99013 Anemia complicating pregnancy, third trimester: Secondary | ICD-10-CM

## 2021-08-22 LAB — CULTURE, OB URINE: Culture: NO GROWTH

## 2021-08-22 NOTE — Progress Notes (Signed)
Patient reports back pain and contractions   Patient informed me that she recently had a visit to MAU due to her having "painful" contractions that were coming "back to back". Ms Geno stated that she has been having contractions but not as intense as before

## 2021-08-23 LAB — CBC
Hematocrit: 27.6 % — ABNORMAL LOW (ref 34.0–46.6)
Hemoglobin: 9 g/dL — ABNORMAL LOW (ref 11.1–15.9)
MCH: 21.6 pg — ABNORMAL LOW (ref 26.6–33.0)
MCHC: 32.6 g/dL (ref 31.5–35.7)
MCV: 66 fL — ABNORMAL LOW (ref 79–97)
Platelets: 265 10*3/uL (ref 150–450)
RBC: 4.16 x10E6/uL (ref 3.77–5.28)
RDW: 18.1 % — ABNORMAL HIGH (ref 11.7–15.4)
WBC: 6.3 10*3/uL (ref 3.4–10.8)

## 2021-08-23 LAB — FERRITIN: Ferritin: 10 ng/mL — ABNORMAL LOW (ref 15–150)

## 2021-08-23 LAB — FOLATE: Folate: 16.5 ng/mL (ref >3.0)

## 2021-08-23 LAB — VITAMIN B12: Vitamin B-12: 183 pg/mL — ABNORMAL LOW (ref 232–1245)

## 2021-08-23 NOTE — Progress Notes (Signed)
° ° °  Subjective:  Rhonda Evans is a 26 y.o. G2P1001 at [redacted]w[redacted]d being seen today for ongoing prenatal care.  She is currently monitored for the following issues for this low-risk pregnancy and has Supervision of low-risk pregnancy; Anemia in pregnancy, third trimester; and LGSIL on Pap smear of cervix on their problem list.  Patient reports occasional contractions.  Contractions: Irritability. Vag. Bleeding: None.  Movement: Present. Denies leaking of fluid.   The following portions of the patient's history were reviewed and updated as appropriate: allergies, current medications, past family history, past medical history, past social history, past surgical history and problem list. Problem list updated.  Objective:   Vitals:   08/22/21 1606  BP: 113/76  Pulse: 100  Weight: 181 lb (82.1 kg)    Fetal Status: Fetal Heart Rate (bpm): 147 Fundal Height: 38 cm Movement: Present     General:  Alert, oriented and cooperative. Patient is in no acute distress.  Skin: Skin is warm and dry. No rash noted.   Cardiovascular: Normal heart rate noted  Respiratory: Normal respiratory effort, no problems with respiration noted  Abdomen: Soft, gravid, appropriate for gestational age. Pain/Pressure: Present     Pelvic:  Cervical exam deferred        Extremities: Normal range of motion.  Edema: None  Mental Status: Normal mood and affect. Normal behavior. Normal judgment and thought content.   Urinalysis:      Assessment and Plan:  Pregnancy: G2P1001 at [redacted]w[redacted]d  1. Encounter for supervision of low-risk pregnancy in third trimester Some contractions and backpain - is not as severe as when she went to the hospital - does not want cervical exam Reviewed selecting a pediatrician  2. Anemia affecting pregnancy in third trimester HGB has been 9 Encouraged to take PNV daily  - CBC - Ferritin - B12 - Folate  3. [redacted] weeks gestation of pregnancy   Preterm labor symptoms and general obstetric precautions  including but not limited to vaginal bleeding, contractions, leaking of fluid and fetal movement were reviewed in detail with the patient. Please refer to After Visit Summary for other counseling recommendations.  Return in about 1 week (around 08/29/2021) for in person ROB.  Nolene Bernheim, RN, MSN, NP-BC Nurse Practitioner, [facility] for [employer], [facility] 08-22-21  5:00 pm

## 2021-08-24 ENCOUNTER — Encounter (HOSPITAL_COMMUNITY): Payer: Self-pay | Admitting: Obstetrics & Gynecology

## 2021-08-24 ENCOUNTER — Other Ambulatory Visit: Payer: Self-pay

## 2021-08-24 ENCOUNTER — Encounter: Payer: Self-pay | Admitting: *Deleted

## 2021-08-24 ENCOUNTER — Inpatient Hospital Stay (HOSPITAL_COMMUNITY)
Admission: AD | Admit: 2021-08-24 | Discharge: 2021-08-25 | Disposition: A | Discharge status: Home / Self Care | Payer: Medicaid Other | Attending: Obstetrics & Gynecology | Admitting: Obstetrics & Gynecology

## 2021-08-24 DIAGNOSIS — Z0371 Encounter for suspected problem with amniotic cavity and membrane ruled out: Secondary | ICD-10-CM | POA: Diagnosis not present

## 2021-08-24 DIAGNOSIS — Z880 Allergy status to penicillin: Secondary | ICD-10-CM | POA: Insufficient documentation

## 2021-08-24 DIAGNOSIS — O26893 Other specified pregnancy related conditions, third trimester: Secondary | ICD-10-CM | POA: Diagnosis present

## 2021-08-24 DIAGNOSIS — Z3A35 35 weeks gestation of pregnancy: Secondary | ICD-10-CM

## 2021-08-24 LAB — URINALYSIS, ROUTINE W REFLEX MICROSCOPIC
Bilirubin Urine: NEGATIVE
Glucose, UA: NEGATIVE mg/dL
Hgb urine dipstick: NEGATIVE
Ketones, ur: 5 mg/dL — AB
Leukocytes,Ua: NEGATIVE
Nitrite: NEGATIVE
Protein, ur: NEGATIVE mg/dL
Specific Gravity, Urine: 1.014 (ref 1.005–1.030)
pH: 6 (ref 5.0–8.0)

## 2021-08-24 LAB — WET PREP, GENITAL
Clue Cells Wet Prep HPF POC: NONE SEEN
Sperm: NONE SEEN
Trich, Wet Prep: NONE SEEN
WBC, Wet Prep HPF POC: <10 (ref <10)
Yeast Wet Prep HPF POC: NONE SEEN

## 2021-08-24 LAB — AMNISURE RUPTURE OF MEMBRANE (ROM) NOT AT ARMC: Amnisure ROM: NEGATIVE

## 2021-08-24 NOTE — MAU Note (Addendum)
Have leaked fld 3 times since 1600. Some mild abd cramping and lower back pain. Fld is clear. Baby has not moved as much today. (Unable to sign waiver due to computer being broken) ? ?

## 2021-08-24 NOTE — Discharge Instructions (Signed)

## 2021-08-24 NOTE — MAU Provider Note (Signed)
?History  ?  ? ?CSN: [id number] ? ?Arrival date and time: 08/24/21 2110 ? ? Event Date/Time  ? First Provider Initiated Contact with Patient 08/24/21 2226   ?  ? ?Chief Complaint  ?Patient presents with  ? Vaginal Discharge  ? Abdominal Pain  ? Back Pain  ? ?HPI ?Rhonda Evans is a 26 y.o. G2P1001 at 41w3dwho presents with possible leaking of fluid. She reports 3 times today she has felt leaking and had to change her shorts one of the times. She does not think it is urine. She denies any bleeding or abnormal discharge. She reports intermittent lower abdominal and lower back cramping. She rates the pain a 4/10 and has not tried anything for the pain.  ? ?OB History   ? ? Gravida  ?2  ? Para  ?1  ? Term  ?1  ? Preterm  ?   ? AB  ?   ? Living  ?1  ?  ? ? SAB  ?   ? IAB  ?   ? Ectopic  ?   ? Multiple  ?   ? Live Births  ?1  ?   ?  ?  ? ? ?Past Medical History:  ?Diagnosis Date  ? Anxiety   ? Depression   ? Medical history non-contributory   ? ? ?Past Surgical History:  ?Procedure Laterality Date  ? NO PAST SURGERIES    ? ? ?Family History  ?Problem Relation Age of Onset  ? Ovarian cancer Mother   ? Ovarian cancer Sister   ? Hypertension Maternal Aunt   ? Diabetes Maternal Grandmother   ?     great  ? Hypertension Maternal Grandmother   ?     great  ? Diabetes Maternal Grandfather   ? Hypertension Maternal Grandfather   ? Cancer Maternal Grandfather   ? Cancer Paternal Grandfather   ? Hearing loss Neg Hx   ? ? ?Social History  ? ?Tobacco Use  ? Smoking status: Never  ? Smokeless tobacco: Never  ?Vaping Use  ? Vaping Use: Former  ?Substance Use Topics  ? Alcohol use: No  ? Drug use: No  ? ? ?Allergies:  ?Allergies  ?Allergen Reactions  ? Amoxicillin Anaphylaxis and Rash  ?  Has patient had a PCN reaction causing immediate rash, facial/tongue/throat swelling, SOB or lightheadedness with hypotension: Yes ?Has patient had a PCN reaction causing severe rash involving mucus membranes or skin necrosis: No ?Has patient had a PCN  reaction that required hospitalization Yes ?Has patient had a PCN reaction occurring within the last 10 years: No ?If all of the above answers are "NO", then may proceed with Cephalosporin use. ?  ? Latex Rash  ? ? ?Medications Prior to Admission  ?Medication Sig Dispense Refill Last Dose  ? Blood Pressure Monitoring (BLOOD PRESSURE KIT) DEVI 1 Device by Does not apply route as needed. (Patient not taking: Reported on 08/22/2021) 1 each 0   ? cyclobenzaprine (FLEXERIL) 10 MG tablet Take 1 tablet (10 mg total) by mouth 2 (two) times daily as needed for muscle spasms. (Patient not taking: Reported on 08/22/2021) 20 tablet 0   ? docusate sodium (COLACE) 100 MG capsule Take 1 capsule (100 mg total) by mouth 2 (two) times daily as needed for mild constipation. (Patient not taking: Reported on 08/22/2021) 60 capsule 0   ? ferrous sulfate 325 (65 FE) MG tablet Take 1 tablet (325 mg total) by mouth every other day. (Patient not taking:  Reported on 08/22/2021) 30 tablet 2   ? pantoprazole (PROTONIX) 20 MG tablet Take 1 tablet (20 mg total) by mouth daily. (Patient not taking: Reported on 08/17/2021) 30 tablet 2   ? Prenatal 27-1 MG TABS Take 1 tablet by mouth daily. 30 tablet 12   ? ? ?Review of Systems  ?Constitutional: Negative.  Negative for fatigue and fever.  ?HENT: Negative.    ?Respiratory: Negative.  Negative for shortness of breath.   ?Cardiovascular: Negative.  Negative for chest pain.  ?Gastrointestinal:  Positive for abdominal pain. Negative for constipation, diarrhea, nausea and vomiting.  ?Genitourinary:  Positive for vaginal discharge. Negative for dysuria and vaginal bleeding.  ?Musculoskeletal:  Positive for back pain.  ?Neurological: Negative.  Negative for dizziness and headaches.  ?Physical Exam  ? ?Blood pressure 112/79, pulse (!) 112, temperature 99 ?F (37.2 ?C), resp. rate 17, height 5' 5" (1.651 m), weight 83 kg, last menstrual period 12/14/2020, SpO2 100 %, unknown if currently breastfeeding. ? ?Physical  Exam ?Vitals and nursing note reviewed.  ?Constitutional:   ?   General: She is not in acute distress. ?   Appearance: She is well-developed.  ?HENT:  ?   Head: Normocephalic.  ?Eyes:  ?   Pupils: Pupils are equal, round, and reactive to light.  ?Cardiovascular:  ?   Rate and Rhythm: Normal rate and regular rhythm.  ?   Heart sounds: Normal heart sounds.  ?Pulmonary:  ?   Effort: Pulmonary effort is normal. No respiratory distress.  ?   Breath sounds: Normal breath sounds.  ?Abdominal:  ?   General: Bowel sounds are normal. There is no distension.  ?   Palpations: Abdomen is soft.  ?   Tenderness: There is no abdominal tenderness.  ?Skin: ?   General: Skin is warm and dry.  ?Neurological:  ?   Mental Status: She is alert and oriented to person, place, and time.  ?Psychiatric:     ?   Mood and Affect: Mood normal.     ?   Behavior: Behavior normal.     ?   Thought Content: Thought content normal.     ?   Judgment: Judgment normal.  ? ?Fetal Tracing: ? ?Baseline:  150 ?Variability:  moderate ?Accels: 15x15 ?Decels: none ? ?Toco: none ? ? Cervix: closed/thick/posterior ? ?MAU Course  ?Procedures ?Results for orders placed or performed during the hospital encounter of 08/24/21 (from the past 24 hour(s))  ?Urinalysis, Routine w reflex microscopic Urine, Clean Catch     Status: Abnormal  ? Collection Time: 08/24/21 10:54 PM  ?Result Value Ref Range  ? Color, Urine YELLOW YELLOW  ? APPearance CLEAR CLEAR  ? Specific Gravity, Urine 1.014 1.005 - 1.030  ? pH 6.0 5.0 - 8.0  ? Glucose, UA NEGATIVE NEGATIVE mg/dL  ? Hgb urine dipstick NEGATIVE NEGATIVE  ? Bilirubin Urine NEGATIVE NEGATIVE  ? Ketones, ur 5 (A) NEGATIVE mg/dL  ? Protein, ur NEGATIVE NEGATIVE mg/dL  ? Nitrite NEGATIVE NEGATIVE  ? Leukocytes,Ua NEGATIVE NEGATIVE  ?Amnisure rupture of membrane (rom)not at [facility]     Status: None  ? Collection Time: 08/24/21 11:21 PM  ?Result Value Ref Range  ? Amnisure ROM NEGATIVE   ?Wet prep, genital     Status: None  ? Collection  Time: 08/24/21 11:21 PM  ?Result Value Ref Range  ? Yeast Wet Prep HPF POC NONE SEEN NONE SEEN  ? Trich, Wet Prep NONE SEEN NONE SEEN  ? Clue Cells Wet Prep HPF  POC NONE SEEN NONE SEEN  ? WBC, Wet Prep HPF POC <10 <10  ? Sperm NONE SEEN   ?  ?MDM ?UA ?Discussed ruling out rupture of membranes and patient declines pelvic exam. Discussed amnisure and patient agreeable ? ?Wet prep and gc/chlamydia ?Amnisure ? ?Assessment and Plan  ? ?1. Encounter for suspected premature rupture of amniotic membranes, with rupture of membranes not found   ?2. [redacted] weeks gestation of pregnancy   ? ?-Discharge home in stable condition ?-Third trimester precautions discussed ?-Patient advised to follow-up with OB as scheduled for prenatal care ?-Patient may return to MAU as needed or if her condition were to change or worsen ? ? ?Wende Mott CNM ?08/24/2021, 10:26 PM  ?

## 2021-08-25 LAB — GC/CHLAMYDIA PROBE AMP (~~LOC~~) NOT AT ARMC
Chlamydia: NEGATIVE
Comment: NEGATIVE
Comment: NORMAL
Neisseria Gonorrhea: NEGATIVE

## 2021-08-30 ENCOUNTER — Ambulatory Visit (INDEPENDENT_AMBULATORY_CARE_PROVIDER_SITE_OTHER): Payer: Medicaid Other | Admitting: Family Medicine

## 2021-08-30 ENCOUNTER — Other Ambulatory Visit (HOSPITAL_COMMUNITY)
Admission: RE | Admit: 2021-08-30 | Discharge: 2021-08-30 | Disposition: A | Discharge status: Home / Self Care | Payer: Medicaid Other | Source: Ambulatory Visit | Attending: Family Medicine | Admitting: Family Medicine

## 2021-08-30 ENCOUNTER — Other Ambulatory Visit: Payer: Self-pay

## 2021-08-30 VITALS — BP 117/82 | HR 102 | Wt 182.3 lb

## 2021-08-30 DIAGNOSIS — Z3493 Encounter for supervision of normal pregnancy, unspecified, third trimester: Secondary | ICD-10-CM | POA: Diagnosis not present

## 2021-08-30 DIAGNOSIS — R87612 Low grade squamous intraepithelial lesion on cytologic smear of cervix (LGSIL): Secondary | ICD-10-CM

## 2021-08-30 DIAGNOSIS — O99013 Anemia complicating pregnancy, third trimester: Secondary | ICD-10-CM

## 2021-08-30 NOTE — Progress Notes (Signed)
? ?  PRENATAL VISIT NOTE ? ?Subjective:  ?Rhonda Evans is a 26 y.o. G2P1001 at [redacted]w[redacted]d being seen today for ongoing prenatal care.  She is currently monitored for the following issues for this low-risk pregnancy and has Supervision of low-risk pregnancy; Anemia in pregnancy, third trimester; and LGSIL on Pap smear of cervix on their problem list. ? ?Patient reports no complaints.  Contractions: Not present. Vag. Bleeding: None.  Movement: Present. Denies leaking of fluid.  ? ?The following portions of the patient's history were reviewed and updated as appropriate: allergies, current medications, past family history, past medical history, past social history, past surgical history and problem list.  ? ?Objective:  ? ?Vitals:  ? 08/30/21 1600  ?BP: 117/82  ?Pulse: (!) 102  ?Weight: 182 lb 4.8 oz (82.7 kg)  ? ? ?Fetal Status: Fetal Heart Rate (bpm): 143   Movement: Present    ? ?General:  Alert, oriented and cooperative. Patient is in no acute distress.  ?Skin: Skin is warm and dry. No rash noted.   ?Cardiovascular: Normal heart rate noted  ?Respiratory: Normal respiratory effort, no problems with respiration noted  ?Abdomen: Soft, gravid, appropriate for gestational age.  Pain/Pressure: Present     ?Pelvic: Cervical exam deferred        ?Extremities: Normal range of motion.  Edema: None  ?Mental Status: Normal mood and affect. Normal behavior. Normal judgment and thought content.  ? ?Assessment and Plan:  ?Pregnancy: G2P1001 at [redacted]w[redacted]d ?1. Encounter for supervision of low-risk pregnancy in third trimester ?- GC/Chlamydia probe amp ([place])not at [facility] ?- Culture, beta strep (group b only) ? ?2. LGSIL on Pap smear of cervix ?Colpo postpartum ? ?3. Anemia in pregnancy, third trimester ?On iron ? ?Preterm labor symptoms and general obstetric precautions including but not limited to vaginal bleeding, contractions, leaking of fluid and fetal movement were reviewed in detail with the patient. ?Please refer to After Visit  Summary for other counseling recommendations.  ? ?No follow-ups on file. ? ?Future Appointments  ?Date Time Provider Department Center  ?09/06/2021 10:35 AM Allayne Stack, DO [facility]  ?09/13/2021  3:55 PM Warden Fillers, MD [facility]  ? ? ?Levie Heritage, DO ?

## 2021-08-31 LAB — GC/CHLAMYDIA PROBE AMP (~~LOC~~) NOT AT ARMC
Chlamydia: NEGATIVE
Comment: NEGATIVE
Comment: NORMAL
Neisseria Gonorrhea: NEGATIVE

## 2021-09-03 LAB — CULTURE, BETA STREP (GROUP B ONLY): Strep Gp B Culture: NEGATIVE

## 2021-09-06 ENCOUNTER — Other Ambulatory Visit: Payer: Self-pay

## 2021-09-06 ENCOUNTER — Encounter: Payer: Self-pay | Admitting: Family Medicine

## 2021-09-06 ENCOUNTER — Ambulatory Visit (INDEPENDENT_AMBULATORY_CARE_PROVIDER_SITE_OTHER): Payer: Medicaid Other | Admitting: Family Medicine

## 2021-09-06 VITALS — BP 123/86 | HR 93 | Wt 186.0 lb

## 2021-09-06 DIAGNOSIS — Z3009 Encounter for other general counseling and advice on contraception: Secondary | ICD-10-CM

## 2021-09-06 DIAGNOSIS — Z3A37 37 weeks gestation of pregnancy: Secondary | ICD-10-CM

## 2021-09-06 DIAGNOSIS — O99013 Anemia complicating pregnancy, third trimester: Secondary | ICD-10-CM

## 2021-09-06 DIAGNOSIS — R87612 Low grade squamous intraepithelial lesion on cytologic smear of cervix (LGSIL): Secondary | ICD-10-CM

## 2021-09-06 DIAGNOSIS — Z3493 Encounter for supervision of normal pregnancy, unspecified, third trimester: Secondary | ICD-10-CM

## 2021-09-06 NOTE — Progress Notes (Signed)
? ? ?  Subjective:  ?Rhonda Evans is a 26 y.o. G2P1001 at [redacted]w[redacted]d being seen today for ongoing prenatal care.  She is currently monitored for the following issues for this low-risk pregnancy and has Supervision of low-risk pregnancy; Anemia in pregnancy, third trimester; and LGSIL on Pap smear of cervix on their problem list. ? ?Patient reports occasional contractions. Occurring for the past several weeks. Wants her cervix checked.  Contractions: Irritability. Vag. Bleeding: None.  Movement: Present. Denies leaking of fluid.  ? ?The following portions of the patient's history were reviewed and updated as appropriate: allergies, current medications, past family history, past medical history, past social history, past surgical history and problem list.  ? ?Objective:  ? ?Vitals:  ? 09/06/21 1050  ?BP: 123/86  ?Pulse: 93  ?Weight: 186 lb (84.4 kg)  ? ? ?Fetal Status: Fetal Heart Rate (bpm): 150 Fundal Height: 38 cm Movement: Present  Presentation: Vertex ? ?General:  Alert, oriented and cooperative. Patient is in no acute distress.  ?Skin: Skin is warm and dry. No rash noted.   ?Cardiovascular: Normal heart rate noted  ?Respiratory: Normal respiratory effort, no problems with respiration noted  ?Abdomen: Soft, gravid, appropriate for gestational age. Pain/Pressure: Present     ?Pelvic:  Cervical exam performed in the presence of a chaperone Dilation: 2 Effacement (%): 50 Station: -3  ?Extremities: Normal range of motion.     ?Mental Status: Normal mood and affect. Normal behavior. Normal judgment and thought content.  ? ?Urinalysis:     ? ?Assessment and Plan:  ?Pregnancy: G2P1001 at [redacted]w[redacted]d ? ?1. Encounter for supervision of low-risk pregnancy in third trimester ?Doing well with normal fetal movement.  ? ?2. [redacted] weeks gestation of pregnancy ?GBS negative.  ? ?3. LGSIL on Pap smear of cervix ?Needs colpo postpartum.  ? ?4. Anemia in pregnancy, third trimester ?Hbg 9.0 with iron deficiency in 08/22/2021. On po iron. Repeat  when on L&D, sooner if needed.  ? ?Term labor symptoms and general obstetric precautions including but not limited to vaginal bleeding, contractions, leaking of fluid and fetal movement were reviewed in detail with the patient. ?Please refer to After Visit Summary for other counseling recommendations.  ? ?Return in about 1 week (around 09/13/2021) for LROB. ? ? ?Allayne Stack, DO ?

## 2021-09-06 NOTE — Progress Notes (Signed)
Patient reports contractions that last more than a minute and would like her cervix checked today  ?

## 2021-09-13 ENCOUNTER — Ambulatory Visit (INDEPENDENT_AMBULATORY_CARE_PROVIDER_SITE_OTHER): Payer: Medicaid Other | Admitting: Certified Nurse Midwife

## 2021-09-13 ENCOUNTER — Other Ambulatory Visit: Payer: Self-pay

## 2021-09-13 VITALS — BP 114/84 | HR 93 | Wt 182.0 lb

## 2021-09-13 DIAGNOSIS — Z3493 Encounter for supervision of normal pregnancy, unspecified, third trimester: Secondary | ICD-10-CM

## 2021-09-13 DIAGNOSIS — Z3A38 38 weeks gestation of pregnancy: Secondary | ICD-10-CM

## 2021-09-13 NOTE — Progress Notes (Signed)
? ?  PRENATAL VISIT NOTE ? ?Subjective:  ?Rhonda Evans is a 26 y.o. G2P1001 at [redacted]w[redacted]d being seen today for ongoing prenatal care.  She is currently monitored for the following issues for this low-risk pregnancy and has Supervision of low-risk pregnancy; Anemia in pregnancy, third trimester; and LGSIL on Pap smear of cervix on their problem list. ? ?Patient reports occasional contractions.  Contractions: Irritability. Vag. Bleeding: None.  Movement: Present. Denies leaking of fluid.  ? ?The following portions of the patient's history were reviewed and updated as appropriate: allergies, current medications, past family history, past medical history, past social history, past surgical history and problem list.  ? ?Objective:  ? ?Vitals:  ? 09/13/21 1613  ?BP: 114/84  ?Pulse: 93  ?Weight: 182 lb (82.6 kg)  ? ? ?Fetal Status: Fetal Heart Rate (bpm): 140 Fundal Height: 38 cm Movement: Present  Presentation: Vertex ? ?General:  Alert, oriented and cooperative. Patient is in no acute distress.  ?Skin: Skin is warm and dry. No rash noted.   ?Cardiovascular: Normal heart rate noted  ?Respiratory: Normal respiratory effort, no problems with respiration noted  ?Abdomen: Soft, gravid, appropriate for gestational age.  Pain/Pressure: Present     ?Pelvic: Cervical exam performed in the presence of a chaperone (per pt request) Dilation: 2 Effacement (%): 50 Station: -3  ?Extremities: Normal range of motion.  Edema: None  ?Mental Status: Normal mood and affect. Normal behavior. Normal judgment and thought content.  ? ?Assessment and Plan:  ?Pregnancy: G2P1001 at [redacted]w[redacted]d ?1. Encounter for supervision of low-risk pregnancy in third trimester ? - Doing well, feeling regular and vigorous fetal movement  ? ?2. [redacted] weeks gestation of pregnancy ?- Routine OB care. Discussed IOL, pt prefers to wait, wants to avoid IOL ? ?Term labor symptoms and general obstetric precautions including but not limited to vaginal bleeding, contractions, leaking of  fluid and fetal movement were reviewed in detail with the patient. ?Please refer to After Visit Summary for other counseling recommendations.  ? ?Return in about 1 week (around 09/20/2021) for LOB, in person. ? ?Future Appointments  ?Date Time Provider Department Center  ?09/20/2021  3:15 PM Kathlene Cote [facility]  ?09/27/2021 10:35 AM Bernerd Limbo, CNM [facility]  ? ? ?Bernerd Limbo, CNM ?

## 2021-09-13 NOTE — Patient Instructions (Signed)
AREA PEDIATRIC/FAMILY PRACTICE PHYSICIANS ? ?[place] ([address]) ?[facility] ?Chambliss, MD; Eniola, MD; Hale, MD; Hensel, MD; McDiarmid, MD; McIntyer, MD; Neal, MD; Walden, MD ?[address] [place], [place] [address] ?[phone] ?Mon-Fri 8:30-12:30, 1:30-5:00 ?Providers come to see babies at [facility] ?Accepting Medicaid ?[facility] ?Limited providers who accept newborns: Koirala, MD; Morrow, MD; Wolters, MD ?[address], [place], [place] [address] ?[phone] ?Mon-Fri 8:00-5:30 ?Babies seen by providers at [facility] ?Does NOT accept Medicaid ?Please call early in hospitalization for appointment (limited availability)  ?[facility] ?Mulberry, MD ?[address] [place], [place] [address] ?[phone] ?Mon, Tue, Thur, Fri 8:30-5:00, Wed 10:00-7:00 (closed 1-2pm) ?Babies seen by [facility] providers ?Accepting Medicaid ?[facility] ?Rubin, MD ?[address], [place], [place] [address] ?[phone] ?Mon-Fri 8:30-5:00, Sat 8:30-12:00 ?Provider comes to see babies at [facility] ?Accepting Medicaid ?Must have been referred from current patients or contacted office prior to delivery ?[facility] ([facility]) ?Brown, MD; Chandler, MD; Ettefagh, MD; Grant, MD; Lester, MD; McCormick, MD; McQueen, MD; Prose, MD; Simha, MD; Stanley, MD; Stryffeler, NP; Tebben, NP ?[address], [place], [place] [address] ?[phone] ?Mon, Tue, Thur, Fri 8:30-5:30, Wed 9:30-5:30, Sat 8:30-12:30 ?Babies seen by [facility] providers ?Accepting Medicaid ?Only accepting infants of first-time parents or siblings of current patients ?Hospital discharge coordinator will make follow-up appointment ?Jack Amos ?[address], [place], [place]  [address] ?[phone]   Fax - [phone] ?[facility] ?[address], [place], [place]  [address] ?Phone - [phone]   Fax - [phone] ?Shilpa Gosrani ?[address], [place], [place]  [address] ?[phone] ? ?[place] ([address]) ?[facility] ?Bates, MD; [place], MD; Cooper, Cox, MD; MD; Davis, MD; Dovico, MD; Ettefaugh, MD; Little, MD; Lowe, MD; Keiffer, MD; Melvin, MD; Sumner, MD; Williams, MD ?[address], [place], [place] [address] ?[phone] ?Mon-Fri 8:30-5:00 (extended evenings Mon-Thur as needed), Sat-Sun 10:00-1:00 ?Providers come to see babies at [facility] ?Accepting Medicaid for families of first-time babies and families with all children in the household age 3 and under. Must register with office prior to making appointment (M-F only). ?[facility] ?Henson, NP; Knapp, MD; Lalonde, MD; Tysinger, PA ?[address] [place], [place] [address] ?[phone] ?Mon-Fri 8:00-5:00 ?Babies seen by providers at [facility] ?Does NOT accept Medicaid/Commercial Insurance Only ?[facility] ([facility])  ?Artis, MD; Barnes, MD; Bratton, MD; Coccaro, MD; Lockett Gardner, MD; Kramer, MD; Marshall, MD; Netherton, MD; Poleto, MD; Skinner, MD ?[address] [place], [place] [address] ?[phone] ?Mon-Fri 8:30-5:30, Sat (Oct.-Mar.) 9:00-1:00 ?Babies seen by providers at [facility] ?Accepting Medicaid ? ?[place] ([address]) ?[facility] ?Reid, MD; Warner, MD ?[address], [place], [place] [address] ?[phone] ?Mon-Fri 8:30-5:00, Sat 8:30-12:00 ?Providers come to see babies at [facility] ?Does NOT accept Medicaid ?[facility] ?Becker, PA; Hagler, MD; Scifres, PA; Sun, MD; Swayne, MD ?[address], [place], [place] [address] ?[phone] ?Mon-Fri 8:00-5:00 ?Babies seen by providers at [facility] ?Does NOT accept Medicaid ?Only accepting babies of parents who  are patients ?Please call early in hospitalization for appointment (limited availability) ?[facility] ?Clark, MD; Frye, MD; Kelleher, MD; Mack, NP; Miller, MD; O'Keller, MD; Patterson, NP; Pudlo, MD; Puzio, MD; Thomas, MD; Tucker, MD; Twiselton, MD ?[address], [place], [place] [address] °[phone] °Mon-Fri 8:00-5:00, Sat 9:00-12:00 °Providers come to see babies at [facility] °Does NOT accept Medicaid ° °[place] ([address]) °[facility] °Limited providers accepting new patients: Brake, NP; Wharton, PA °[address], [place], [place] [address] °[phone] °Mon-Fri 8:00-5:00 °Babies seen by providers at [facility] °Does NOT accept Medicaid °Only accepting babies of parents who are patients °Please call early in hospitalization for appointment (limited availability) °[facility] °Gay, MD; Quinlan, MD °[address] [place], [place] [address] °[phone] (press [phone] to schedule appointment) °Mon-Fri 8:00-5:00 °Providers come to see babies at [facility] °Does NOT accept Medicaid °[facility] °Mazer, MD °[address] [place], [place] [address] °[phone] °Mon-Fri 8:30-5:00 (lunch 12:30-1:00), extended hours by appointment only Wed 5:00-6:30 °Babies seen by [facility] providers °Accepting Medicaid °[facility] °Banks, MD; Jordan, MD; Koberlein, MD °[address], [place], [place] [address] °[phone] °Mon-Fri 8:00-5:00 °Babies seen by [facility] providers °Does NOT accept Medicaid °[facility] °Parker, MD; Hunter, MD; Wallace, DO °[address] [place], [place] [address] °[phone] °Mon-Fri 8:00-5:00 °Babies seen by [facility] providers °Does NOT accept Medicaid °[facility] °Brandon, PA; Brecken, PA; Christy, NP; Dees, MD; DeClaire, MD; DeWeese, MD; Hansen, NP; Mills, NP; Parrish, NP; Smoot, NP; Summer, MD; Vapne,  MD °[address] [place], [place] [address] °[phone] °Mon-Fri 8:30-5:00, Sat 10:00-1:00 °Providers come to see babies at [facility] °Does NOT accept Medicaid °Free prenatal information session Tuesdays at 4:45pm °[facility] °Bouska, MD; Gordon, PA; Jeffery, PA; Weber, PA °[address] [place] [address] °[phone] °Mon-Fri 7:30-5:30 °Babies seen by [facility] providers °[facility] °[address], [place], [place]  [address] °[phone]   Fax - [phone] ° °[place] ([address]) °[facility] °Reese, MD °[address] [place], [place] [address] °[phone] °Mon-Thur 8:00-6:00 °Providers come to see babies at [facility] °Accepting Medicaid °[facility] °Anderson, NP; Badger, MD; Beal, PA; Spencer, PA °[address] [place], [place] [address] °[phone] °Mon-Thur 7:30-7:30, Fri 7:30-4:30 °Babies seen by [facility] providers °Accepting Medicaid °[facility] °Agbuya, MD; Klett, NP; Romgoolam, MD °[address], [place], [place] [address] °[phone] °Mon-Fri 8:30-5:00, Sat 8:30-12:00 °Providers come to see babies at [facility] °Accepting Medicaid °Must have “Meet & Greet” appointment at office prior to delivery °[facility] ([facility]) °McCord, MD; Wallace, MD; Wood, MD °[address], [place], [place] [address] °[phone] °Mon-Wed 8:00-6:00, Thur-Fri 8:00-5:00, Sat 9:00-12:00 °Providers come to see babies at [facility] °Does NOT accept Medicaid °Only accepting siblings of current patients °[facility]  °[address], [place],   [address] °[phone]   Fax - [phone] °[facility] °[address], [place],   [address] °[phone]   Fax - [phone] ° °

## 2021-09-20 ENCOUNTER — Other Ambulatory Visit: Payer: Self-pay | Admitting: Advanced Practice Midwife

## 2021-09-20 ENCOUNTER — Ambulatory Visit (INDEPENDENT_AMBULATORY_CARE_PROVIDER_SITE_OTHER): Payer: Medicaid Other | Admitting: Medical

## 2021-09-20 VITALS — BP 125/81 | HR 92 | Wt 185.4 lb

## 2021-09-20 DIAGNOSIS — R87612 Low grade squamous intraepithelial lesion on cytologic smear of cervix (LGSIL): Secondary | ICD-10-CM

## 2021-09-20 DIAGNOSIS — Z3493 Encounter for supervision of normal pregnancy, unspecified, third trimester: Secondary | ICD-10-CM

## 2021-09-20 DIAGNOSIS — O99013 Anemia complicating pregnancy, third trimester: Secondary | ICD-10-CM

## 2021-09-20 DIAGNOSIS — Z3A39 39 weeks gestation of pregnancy: Secondary | ICD-10-CM

## 2021-09-20 NOTE — Progress Notes (Signed)
? ?  PRENATAL VISIT NOTE ? ?Subjective:  ?Rhonda Evans is a 26 y.o. G2P1001 at [redacted]w[redacted]d being seen today for ongoing prenatal care.  She is currently monitored for the following issues for this low-risk pregnancy and has Supervision of low-risk pregnancy; Anemia in pregnancy, third trimester; and LGSIL on Pap smear of cervix on their problem list. ? ?Patient reports occasional contractions. Patient feels intermittent back pain that radiates to the front of her abdomen. Patient reports that pain stops when she sits  Contractions: Irritability. Vag. Bleeding: None, Scant.  Movement: Present. Denies leaking of fluid.  ? ?The following portions of the patient's history were reviewed and updated as appropriate: allergies, current medications, past family history, past medical history, past social history, past surgical history and problem list.  ? ?Objective:  ? ?Vitals:  ? 09/20/21 1530  ?BP: 125/81  ?Pulse: 92  ?Weight: 185 lb 6.4 oz (84.1 kg)  ? ? ?Fetal Status: Fetal Heart Rate (bpm): 160   Movement: Present    ? ?General:  Alert, oriented and cooperative. Patient is in no acute distress.  ?Skin: Skin is warm and dry. No rash noted.   ?Cardiovascular: Normal heart rate noted  ?Respiratory: Normal respiratory effort, no problems with respiration noted  ?Abdomen: Soft, gravid, appropriate for gestational age.  Pain/Pressure: Present     ?Pelvic: Cervical exam deferred        ?Extremities: Normal range of motion.  Edema: Trace  ?Mental Status: Normal mood and affect. Normal behavior. Normal judgment and thought content.  ? ?Assessment and Plan:  ?Pregnancy: G2P1001 at [redacted]w[redacted]d ?1. Encounter for supervision of low-risk pregnancy in third trimester ?- well-appearing exam, feeling good movement ?- Patient undecided on Pediatrician - encouraged follow-up ?-Unsure on contraceptive methods postpartum, Declined discussing options at this appointment ? ?2. [redacted] weeks gestation of pregnancy ?- Patient prefers elective induction, will  coordinate scheduling  ? ?3. Anemia in pregnancy, third trimester ? ?4. LGSIL on Pap smear of cervix ?- Will need Colpo postpartum  ? ?Term labor symptoms and general obstetric precautions including but not limited to vaginal bleeding, contractions, leaking of fluid and fetal movement were reviewed in detail with the patient. ?Please refer to After Visit Summary for other counseling recommendations.  ? ?Return in about 1 week (around 09/27/2021) for LOB. ? ?Future Appointments  ?Date Time Provider Department Center  ?09/27/2021 10:35 AM Bernerd Limbo, CNM [facility]  ? ? ?Corlis Hove, NP ? ?

## 2021-09-21 ENCOUNTER — Encounter (HOSPITAL_COMMUNITY): Payer: Self-pay | Admitting: *Deleted

## 2021-09-21 ENCOUNTER — Telehealth (HOSPITAL_COMMUNITY): Payer: Self-pay | Admitting: *Deleted

## 2021-09-21 NOTE — Telephone Encounter (Signed)
Preadmission screen  

## 2021-09-24 ENCOUNTER — Encounter (HOSPITAL_COMMUNITY): Payer: Self-pay | Admitting: Obstetrics and Gynecology

## 2021-09-24 ENCOUNTER — Inpatient Hospital Stay (HOSPITAL_COMMUNITY): Payer: Medicaid Other

## 2021-09-24 ENCOUNTER — Inpatient Hospital Stay (HOSPITAL_COMMUNITY)
Admission: AD | Admit: 2021-09-24 | Discharge: 2021-09-26 | DRG: 806 | Disposition: A | Discharge status: Home / Self Care | Payer: Medicaid Other | Attending: Obstetrics and Gynecology | Admitting: Obstetrics and Gynecology

## 2021-09-24 DIAGNOSIS — O26893 Other specified pregnancy related conditions, third trimester: Secondary | ICD-10-CM | POA: Diagnosis present

## 2021-09-24 DIAGNOSIS — O9902 Anemia complicating childbirth: Principal | ICD-10-CM | POA: Diagnosis present

## 2021-09-24 DIAGNOSIS — Z3490 Encounter for supervision of normal pregnancy, unspecified, unspecified trimester: Secondary | ICD-10-CM

## 2021-09-24 DIAGNOSIS — Z3A39 39 weeks gestation of pregnancy: Secondary | ICD-10-CM

## 2021-09-24 DIAGNOSIS — D509 Iron deficiency anemia, unspecified: Secondary | ICD-10-CM | POA: Diagnosis present

## 2021-09-24 DIAGNOSIS — D62 Acute posthemorrhagic anemia: Secondary | ICD-10-CM | POA: Diagnosis not present

## 2021-09-24 DIAGNOSIS — Z3403 Encounter for supervision of normal first pregnancy, third trimester: Secondary | ICD-10-CM | POA: Insufficient documentation

## 2021-09-24 DIAGNOSIS — O2341 Unspecified infection of urinary tract in pregnancy, first trimester: Secondary | ICD-10-CM | POA: Diagnosis present

## 2021-09-24 DIAGNOSIS — R87612 Low grade squamous intraepithelial lesion on cytologic smear of cervix (LGSIL): Secondary | ICD-10-CM | POA: Diagnosis present

## 2021-09-24 DIAGNOSIS — O3443 Maternal care for other abnormalities of cervix, third trimester: Secondary | ICD-10-CM | POA: Diagnosis present

## 2021-09-24 DIAGNOSIS — Z880 Allergy status to penicillin: Secondary | ICD-10-CM | POA: Diagnosis not present

## 2021-09-24 LAB — CBC
HCT: 29.3 % — ABNORMAL LOW (ref 36.0–46.0)
Hemoglobin: 9.4 g/dL — ABNORMAL LOW (ref 12.0–15.0)
MCH: 21 pg — ABNORMAL LOW (ref 26.0–34.0)
MCHC: 32.1 g/dL (ref 30.0–36.0)
MCV: 65.4 fL — ABNORMAL LOW (ref 80.0–100.0)
Platelets: 306 10*3/uL (ref 150–400)
RBC: 4.48 MIL/uL (ref 3.87–5.11)
RDW: 19.4 % — ABNORMAL HIGH (ref 11.5–15.5)
WBC: 7.9 10*3/uL (ref 4.0–10.5)
nRBC: 0 % (ref 0.0–0.2)

## 2021-09-24 LAB — TYPE AND SCREEN
ABO/RH(D): O POS
Antibody Screen: NEGATIVE

## 2021-09-24 MED ORDER — ONDANSETRON HCL 4 MG/2ML IJ SOLN
4.0000 mg | Freq: Four times a day (QID) | INTRAMUSCULAR | Status: DC | PRN
Start: 2021-09-24 — End: 2021-09-24

## 2021-09-24 MED ORDER — MISOPROSTOL 25 MCG QUARTER TABLET
25.0000 ug | ORAL_TABLET | ORAL | Status: DC | PRN
Start: 2021-09-24 — End: 2021-09-24

## 2021-09-24 MED ORDER — IRON SUCROSE 20 MG/ML IV SOLN
500.0000 mg | Freq: Once | INTRAVENOUS | Status: DC
  Filled 2021-09-24: qty 25

## 2021-09-24 MED ORDER — TETANUS-DIPHTH-ACELL PERTUSSIS 5-2.5-18.5 LF-MCG/0.5 IM SUSY: 0.5000 mL | PREFILLED_SYRINGE | Freq: Once | INTRAMUSCULAR | Status: DC

## 2021-09-24 MED ORDER — ZOLPIDEM TARTRATE 5 MG PO TABS: 5.0000 mg | ORAL_TABLET | Freq: Every evening | ORAL | Status: DC | PRN

## 2021-09-24 MED ORDER — ACETAMINOPHEN 325 MG PO TABS: 650.0000 mg | ORAL_TABLET | ORAL | Status: DC | PRN

## 2021-09-24 MED ORDER — ONDANSETRON HCL 4 MG PO TABS: 4.0000 mg | ORAL_TABLET | ORAL | Status: DC | PRN

## 2021-09-24 MED ORDER — BENZOCAINE-MENTHOL 20-0.5 % EX AERO: 1.0000 "application " | INHALATION_SPRAY | CUTANEOUS | Status: DC | PRN

## 2021-09-24 MED ORDER — OXYTOCIN-SODIUM CHLORIDE 30-0.9 UT/500ML-% IV SOLN: 2.5000 [IU]/h | INTRAVENOUS | Status: DC

## 2021-09-24 MED ORDER — FENTANYL CITRATE (PF) 100 MCG/2ML IJ SOLN
100.0000 ug | INTRAMUSCULAR | Status: DC | PRN
Start: 2021-09-24 — End: 2021-09-26
  Administered 2021-09-24 (×3): 100 ug via INTRAVENOUS
  Filled 2021-09-24 (×3): qty 2

## 2021-09-24 MED ORDER — SENNOSIDES-DOCUSATE SODIUM 8.6-50 MG PO TABS
2.0000 | ORAL_TABLET | Freq: Every day | ORAL | Status: DC
  Administered 2021-09-25 – 2021-09-26 (×2): 2 via ORAL
  Filled 2021-09-24 (×2): qty 2

## 2021-09-24 MED ORDER — LACTATED RINGERS IV SOLN: 500.0000 mL | INTRAVENOUS | Status: DC | PRN

## 2021-09-24 MED ORDER — OXYTOCIN BOLUS FROM INFUSION
333.0000 mL | Freq: Once | INTRAVENOUS | Status: AC
  Administered 2021-09-24: 333 mL via INTRAVENOUS

## 2021-09-24 MED ORDER — ONDANSETRON HCL 4 MG/2ML IJ SOLN: 4.0000 mg | INTRAMUSCULAR | Status: DC | PRN

## 2021-09-24 MED ORDER — SIMETHICONE 80 MG PO CHEW
80.0000 mg | CHEWABLE_TABLET | ORAL | Status: DC | PRN
Start: 2021-09-24 — End: 2021-09-26

## 2021-09-24 MED ORDER — COCONUT OIL OIL: 1.0000 "application " | TOPICAL_OIL | Status: DC | PRN

## 2021-09-24 MED ORDER — TERBUTALINE SULFATE 1 MG/ML IJ SOLN: 0.2500 mg | Freq: Once | INTRAMUSCULAR | Status: DC | PRN

## 2021-09-24 MED ORDER — DIPHENHYDRAMINE HCL 25 MG PO CAPS: 25.0000 mg | ORAL_CAPSULE | Freq: Four times a day (QID) | ORAL | Status: DC | PRN

## 2021-09-24 MED ORDER — OXYTOCIN-SODIUM CHLORIDE 30-0.9 UT/500ML-% IV SOLN
1.0000 m[IU]/min | INTRAVENOUS | Status: DC
  Administered 2021-09-24: 2 m[IU]/min via INTRAVENOUS
  Filled 2021-09-24: qty 500

## 2021-09-24 MED ORDER — LACTATED RINGERS IV SOLN
INTRAVENOUS | Status: DC
Start: 2021-09-24 — End: 2021-09-24

## 2021-09-24 MED ORDER — DIBUCAINE (PERIANAL) 1 % EX OINT: 1.0000 "application " | TOPICAL_OINTMENT | CUTANEOUS | Status: DC | PRN

## 2021-09-24 MED ORDER — WITCH HAZEL-GLYCERIN EX PADS: 1.0000 "application " | MEDICATED_PAD | CUTANEOUS | Status: DC | PRN

## 2021-09-24 MED ORDER — OXYCODONE-ACETAMINOPHEN 5-325 MG PO TABS: 2.0000 | ORAL_TABLET | ORAL | Status: DC | PRN

## 2021-09-24 MED ORDER — IBUPROFEN 600 MG PO TABS
600.0000 mg | ORAL_TABLET | Freq: Four times a day (QID) | ORAL | Status: DC
  Administered 2021-09-24 – 2021-09-26 (×7): 600 mg via ORAL
  Filled 2021-09-24 (×7): qty 1

## 2021-09-24 MED ORDER — LIDOCAINE HCL (PF) 1 % IJ SOLN: 30.0000 mL | INTRAMUSCULAR | Status: DC | PRN

## 2021-09-24 MED ORDER — OXYCODONE-ACETAMINOPHEN 5-325 MG PO TABS: 1.0000 | ORAL_TABLET | ORAL | Status: DC | PRN

## 2021-09-24 MED ORDER — SODIUM CHLORIDE 0.9 % IV SOLN
500.0000 mg | Freq: Once | INTRAVENOUS | Status: AC
  Administered 2021-09-25: 500 mg via INTRAVENOUS
  Filled 2021-09-24: qty 25

## 2021-09-24 MED ORDER — SOD CITRATE-CITRIC ACID 500-334 MG/5ML PO SOLN: 30.0000 mL | ORAL | Status: DC | PRN

## 2021-09-24 MED ORDER — PRENATAL MULTIVITAMIN CH
1.0000 | ORAL_TABLET | Freq: Every day | ORAL | Status: DC
  Administered 2021-09-25 – 2021-09-26 (×2): 1 via ORAL
  Filled 2021-09-24 (×2): qty 1

## 2021-09-24 NOTE — Lactation Note (Signed)
This note was copied from a baby's chart. ?Lactation Consultation Note ?Assisted in latching baby. ?Mom felt that the sucking was strong at first then adjusted flange and baby got more relaxed at feeding. ?Will f/u w/mom on MBU. Mom very tired and hungry. Will leave for bonding time. ? ?Patient Name: Rhonda Evans ?Today's Date: 09/24/2021 ?Reason for consult: L&D Initial assessment;Term ?Age:25 hours ? ?Maternal Data ?  ? ?Feeding ?  ? ?LATCH Score ?Latch: Grasps breast easily, tongue down, lips flanged, rhythmical sucking. ? ?Audible Swallowing: None ? ?Type of Nipple: Everted at rest and after stimulation ? ?Comfort (Breast/Nipple): Soft / non-tender ? ?Hold (Positioning): Assistance needed to correctly position infant at breast and maintain latch. ? ?LATCH Score: 7 ? ? ?Lactation Tools Discussed/Used ?  ? ?Interventions ?Interventions: Assisted with latch;Skin to skin;Breast compression;Adjust position;Support pillows ? ?Discharge ?  ? ?Consult Status ?Consult Status: Follow-up from L&D ?Date: 09/25/21 ?Follow-up type: In-patient ? ? ? ?[name] ?09/24/2021, 9:13 PM ? ? ? ?

## 2021-09-24 NOTE — Discharge Summary (Addendum)
? ?  Postpartum Discharge Summary ? ?   ?Patient Name: Rhonda Evans ?DOB: 1995-10-22 ?MRN: [id number] ? ?Date of admission: 09/24/2021 ?Delivery date:09/24/2021  ?Delivering provider: Genia Del  ?Date of discharge: 09/26/2021 ? ?Admitting diagnosis: Supervision of low-risk first pregnancy, third trimester [Z34.03] ?Intrauterine pregnancy: [redacted]w[redacted]d    ?Secondary diagnosis:  Principal Problem: ?  Vaginal delivery ?Active Problems: ?  Supervision of low-risk pregnancy ?  Iron deficiency anemia during pregnancy ?  LGSIL on Pap smear of cervix ? ?Additional problems: None     ?Discharge diagnosis: Term Pregnancy Delivered                                              ?Post partum procedures: None ?Augmentation: AROM and Pitocin ?Complications: None ? ?Hospital course: Induction of Labor With Vaginal Delivery   ?26y.o. yo G2P1001 at 370w5das admitted to the hospital 09/24/2021 for induction of labor.  Indication for induction: Elective.  Patient had an uncomplicated labor course and progressed to complete after Pitocin and AROM. She had an uncomplicated vaginal delivery.  ?Membrane Rupture Time/Date: 2:23 PM ,09/24/2021   ?Delivery Method:Vaginal, Spontaneous  ?Episiotomy: None  ?Lacerations:  None  ?Details of delivery can be found in separate delivery note.  Patient had a routine postpartum course.  She is eating, drinking, voiding, and ambulating without issue. She received IV Venofer for iron deficiency anemia, complicated by acute blood loss anemia post-delivery.  She remained asymptomatic from her anemia while inpatient.  Her pain and bleeding are controlled.  She is breastfeeding well.  Patient is discharged home 09/26/21. ? ?Newborn Data: ?Birth date:09/24/2021  ?Birth time:7:51 PM  ?Gender:Female  ?Living status:Living  ?Apgars:8 ,9  ?Weight:3630 g  ? ?Magnesium Sulfate received: No ?BMZ received: No ?Rhophylac: N/A ?MMR: N/A ?T-DaP: Given prenatally ?Flu: No ?Transfusion: No ? ?Physical exam  ?Vitals:  ? 09/25/21  1530 09/25/21 2239 09/25/21 2335 09/26/21 [id number]?BP: 105/62 122/85 110/69 114/84  ?Pulse: 88 88 83 73  ?Resp: 18 16  18   ?Temp: 98.4 ?F (36.9 ?C) 98.6 ?F (37 ?C)  98.5 ?F (36.9 ?C)  ?TempSrc: Oral Oral  Oral  ?SpO2: 100% 100%  100%  ?Weight:      ?Height:      ? ?General: alert, cooperative, and no distress ?Lochia: appropriate ?Uterine Fundus: firm ?Incision: N/A ?DVT Evaluation: No cords or calf tenderness. ?No significant calf/ankle edema. ? ?Labs: ?Lab Results  ?Component Value Date  ? WBC 11.7 (H) 09/25/2021  ? HGB 8.3 (L) 09/25/2021  ? HCT 26.0 (L) 09/25/2021  ? MCV 64.8 (L) 09/25/2021  ? PLT 262 09/25/2021  ? ? ?  Latest Ref Rng & Units 02/23/2021  ?  4:38 PM  ?CMP  ?Glucose 65 - 99 mg/dL 67    ?BUN 6 - 20 mg/dL 4    ?Creatinine 0.57 - 1.00 mg/dL 0.54    ?Sodium 134 - 144 mmol/L 137    ?Potassium 3.5 - 5.2 mmol/L 3.9    ?Chloride 96 - 106 mmol/L 103    ?CO2 20 - 29 mmol/L 18    ?Calcium 8.7 - 10.2 mg/dL 9.8    ?Total Protein 6.0 - 8.5 g/dL 7.1    ?Total Bilirubin 0.0 - 1.2 mg/dL 0.3    ?Alkaline Phos 44 - 121 IU/L 57    ?AST 0 - 40 IU/L 13    ?  ALT 0 - 32 IU/L 9    ? ?Edinburgh Score: ? ?  09/26/2021  ?  7:42 AM  ?Flavia Shipper Postnatal Depression Scale Screening Tool  ?I have been able to laugh and see the funny side of things. 0  ?I have looked forward with enjoyment to things. 0  ?I have blamed myself unnecessarily when things went wrong. 1  ?I have been anxious or worried for no good reason. 0  ?I have felt scared or panicky for no good reason. 0  ?Things have been getting on top of me. 0  ?I have been so unhappy that I have had difficulty sleeping. 0  ?I have felt sad or miserable. 0  ?I have been so unhappy that I have been crying. 0  ?The thought of harming myself has occurred to me. 0  ?Edinburgh Postnatal Depression Scale Total 1  ? ? ? ?After visit meds:  ?Allergies as of 09/26/2021   ? ?   Reactions  ? Amoxicillin Anaphylaxis, Rash  ? Has patient had a PCN reaction causing immediate rash,  facial/tongue/throat swelling, SOB or lightheadedness with hypotension: Yes ?Has patient had a PCN reaction causing severe rash involving mucus membranes or skin necrosis: No ?Has patient had a PCN reaction that required hospitalization Yes ?Has patient had a PCN reaction occurring within the last 10 years: No ?If all of the above answers are "NO", then may proceed with Cephalosporin use.  ? Latex Rash  ? ?  ? ?  ?Medication List  ?  ? ?STOP taking these medications   ? ?cyclobenzaprine 10 MG tablet ?Commonly known as: FLEXERIL ?  ?pantoprazole 20 MG tablet ?Commonly known as: Protonix ?  ? ?  ? ?TAKE these medications   ? ?acetaminophen 500 MG tablet ?Commonly known as: TYLENOL ?Take 2 tablets (1,000 mg total) by mouth every 8 (eight) hours as needed (pain). ?  ?docusate sodium 100 MG capsule ?Commonly known as: COLACE ?Take 1 capsule (100 mg total) by mouth 2 (two) times daily as needed for mild constipation. ?  ?ferrous sulfate 325 (65 FE) MG tablet ?Take 1 tablet (325 mg total) by mouth every other day. ?  ?ibuprofen 600 MG tablet ?Commonly known as: ADVIL ?Take 1 tablet (600 mg total) by mouth every 6 (six) hours as needed (pain). ?  ?Prenatal 27-1 MG Tabs ?Take 1 tablet by mouth daily. ?  ? ?  ? ? ? ?Discharge home in stable condition ?Infant Feeding: Breast ?Infant Disposition: home with mother ?Discharge instruction: per After Visit Summary and Postpartum booklet. ?Activity: Advance as tolerated. Pelvic rest for 6 weeks.  ?Diet: routine diet ?Future Appointments: ?Future Appointments  ?Date Time Provider [place]  ?10/23/2021  9:55 AM Griffin Basil, MD [facility]  ?11/06/2021  9:15 AM Griffin Basil, MD [facility]  ? ?Follow up Visit: ?Message sent to [facility] by Dr. Gwenlyn Perking on 09/24/21. ? ?Please schedule this patient for a In person postpartum visit in 6 weeks with the following provider: Any provider. ?Additional Postpartum F/U: Colposcopy postpartum - please schedule colposcopy at 4 weeks postpartum  prior to [facility] visit   ?Low risk pregnancy complicated by:  None ?Delivery mode:  Vaginal, Spontaneous  ?Anticipated Birth Control:  IUD outpatient  ? ?09/26/2021 ?Daniel Nones, MD ?FM PGY-1 ? ? ?GME ATTESTATION:  ?I saw and evaluated the patient. I agree with the findings and the plan of care as documented in the resident?s note. I have made changes to documentation as necessary. ? ?  Progressing well postpartum and meeting all milestones. VSS, exam within normal limits. Stable for discharge.  ? ?Vilma Meckel, MD ?OB Fellow, Faculty Practice ?[place] [facility] ?09/26/2021 8:46 AM ? ? ?

## 2021-09-24 NOTE — Progress Notes (Signed)
Discussed with patient about the need to titrate pitocin to progress into active labor and make cervical change. Tried to go up on her pitocin and asked but patient wanted to wait 30 more minutes. Will check back in 30 minutes to continue titrating if patient allows. Also offered IV pain medication but pt declined. ?

## 2021-09-24 NOTE — Progress Notes (Signed)
Labor Progress Note ?Rhonda Evans is a 26 y.o. G2P1001 at [redacted]w[redacted]d who presented for eIOL.  ? ?S: Doing well.Contractions are getting more intense. Feeling lots of pressure per RN.   ? ?O:  ?BP 115/75   Pulse 92   Temp 97.8 ?F (36.6 ?C) (Oral)   Resp 17   Ht 5\' 5"  (1.651 m)   Wt 83.9 kg   LMP 12/14/2020   SpO2 100%   BMI 30.79 kg/m?  ? ?EFM: Baseline 130 bpm, moderate variability, + accels, no decels  ?Toco: Every 1-4 minutes  ? ?CVE: Dilation: 8.5 ?Effacement (%): 90 ?Cervical Position: Posterior ?Station: -1 ?Presentation: Vertex ?Exam by:: [email or web address], RN ? ? ?A&P: 26 y.o. G2P1001 [redacted]w[redacted]d  ? ?#Labor: Progressing well. Feeling lots of pressure and urge to push. Anticipate SVD shortly.  ?#Pain: PRN, maternal support  ?#FWB: Cat 1  ?#GBS negative ? ?[redacted]w[redacted]d, MD ?7:35 PM ? ?

## 2021-09-24 NOTE — Progress Notes (Signed)
Labor Progress Note ?Rhonda Evans is a 26 y.o. G2P1001 at [redacted]w[redacted]d who presented for eIOL.  ? ?S: Doing well. Feeling some cramping. No concerns at this time. ? ?O:  ?BP 131/62   Pulse (!) 101   Temp 98.5 ?F (36.9 ?C)   Resp 16   Ht 5\' 5"  (1.651 m)   Wt 83.9 kg   LMP 12/14/2020   SpO2 100%   BMI 30.79 kg/m?  ? ?EFM: Baseline 135 bpm, moderate variability, + accels, no decels  ?Toco: Every 3 minutes  ? ?CVE: Dilation: 3 ?Effacement (%): 70 ?Cervical Position: Posterior ?Station: -2 ?Presentation: Vertex ?Exam by:: Vilma Meckel ? ?A&P: 26 y.o. G2P1001 [redacted]w[redacted]d  ? ?#Labor: Progressing well. AROM performed with clear fluid. Fetal head well applied. Mom and baby tolerated well. Will continue Pitocin and reassess in 4 hours, sooner as needed.  ?#Pain: PRN ?#FWB: Cat 1  ?#GBS negative ? ?Genia Del, MD ?2:31 PM ? ?

## 2021-09-24 NOTE — H&P (Signed)
OBSTETRIC ADMISSION HISTORY AND PHYSICAL ? ?Rhonda Evans is a 26 y.o. female G2P1001 with IUP at 26w5dby 10 week UKoreapresenting for eIOL. She reports +FMs, no LOF, no VB, no blurry vision, headaches, peripheral edema, or RUQ pain.  She plans on breast feeding. She is planning for an outpatient IUD for birth control postpartum. ? ?[facility]  ? ?Dating: By [place]--->  Estimated Date of Delivery: 09/26/21 ? ?Sono:   ?@[redacted]w[redacted]d , normal anatomy, cephalic presentation, anterior placental lie, 2371 g, 41% EFW ? ?Prenatal History/Complications:  ?Iron deficiency anemia  ?LGSIL on pap smear  ? ?Past Medical History: ?Past Medical History:  ?Diagnosis Date  ? Anxiety   ? Depression   ? Medical history non-contributory   ? Vaginal Pap smear, abnormal   ? ? ?Past Surgical History: ?Past Surgical History:  ?Procedure Laterality Date  ? NO PAST SURGERIES    ? ? ?Obstetrical History: ?OB History   ? ? Gravida  ?2  ? Para  ?1  ? Term  ?1  ? Preterm  ?   ? AB  ?   ? Living  ?1  ?  ? ? SAB  ?   ? IAB  ?   ? Ectopic  ?   ? Multiple  ?   ? Live Births  ?1  ?   ?  ?  ? ? ?Social History ?Social History  ? ?Socioeconomic History  ? Marital status: Single  ?  Spouse name: KMarney Setting ? Number of children: 1  ? Years of education: Not on file  ? Highest education level: Not on file  ?Occupational History  ? Not on file  ?Tobacco Use  ? Smoking status: Never  ? Smokeless tobacco: Never  ?Vaping Use  ? Vaping Use: Former  ?Substance and Sexual Activity  ? Alcohol use: No  ? Drug use: No  ? Sexual activity: Yes  ?  Partners: Male  ?  Birth control/protection: None  ?Other Topics Concern  ? Not on file  ?Social History Narrative  ? Not on file  ? ?Social Determinants of Health  ? ?Financial Resource Strain: Not on file  ?Food Insecurity: Food Insecurity Present  ? Worried About RCharity fundraiserin the Last Year: Never true  ? Ran Out of Food in the Last Year: Sometimes true  ?Transportation Needs: Unmet Transportation  Needs  ? Lack of Transportation (Medical): Yes  ? Lack of Transportation (Non-Medical): Yes  ?Physical Activity: Not on file  ?Stress: Not on file  ?Social Connections: Not on file  ? ? ?Family History: ?Family History  ?Problem Relation Age of Onset  ? Ovarian cancer Mother   ? Ovarian cancer Sister   ? Hypertension Maternal Aunt   ? Diabetes Maternal Grandmother   ?     great  ? Hypertension Maternal Grandmother   ?     great  ? Diabetes Maternal Grandfather   ? Hypertension Maternal Grandfather   ? Cancer Maternal Grandfather   ? Cancer Paternal Grandfather   ? Hearing loss Neg Hx   ? ? ?Allergies: ?Allergies  ?Allergen Reactions  ? Amoxicillin Anaphylaxis and Rash  ?  Has patient had a PCN reaction causing immediate rash, facial/tongue/throat swelling, SOB or lightheadedness with hypotension: Yes ?Has patient had a PCN reaction causing severe rash involving mucus membranes or skin necrosis: No ?Has patient had a PCN reaction that required hospitalization Yes ?Has patient had a PCN reaction  occurring within the last 10 years: No ?If all of the above answers are "NO", then may proceed with Cephalosporin use. ?  ? Latex Rash  ? ? ?Medications Prior to Admission  ?Medication Sig Dispense Refill Last Dose  ? Blood Pressure Monitoring (BLOOD PRESSURE KIT) DEVI 1 Device by Does not apply route as needed. 1 each 0 Unknown  ? cyclobenzaprine (FLEXERIL) 10 MG tablet Take 1 tablet (10 mg total) by mouth 2 (two) times daily as needed for muscle spasms. (Patient not taking: Reported on 08/22/2021) 20 tablet 0   ? docusate sodium (COLACE) 100 MG capsule Take 1 capsule (100 mg total) by mouth 2 (two) times daily as needed for mild constipation. (Patient not taking: Reported on 08/22/2021) 60 capsule 0   ? ferrous sulfate 325 (65 FE) MG tablet Take 1 tablet (325 mg total) by mouth every other day. (Patient not taking: Reported on 08/22/2021) 30 tablet 2   ? pantoprazole (PROTONIX) 20 MG tablet Take 1 tablet (20 mg total) by mouth  daily. (Patient not taking: Reported on 08/17/2021) 30 tablet 2   ? Prenatal 27-1 MG TABS Take 1 tablet by mouth daily. 30 tablet 12 More than a month  ? ? ? ?Review of Systems  ?All systems reviewed and negative except as stated in HPI ? ?Blood pressure 117/66, pulse 93, resp. rate 17, height 5' 5"  (1.651 m), weight 83.9 kg, last menstrual period 12/14/2020, SpO2 100 %, unknown if currently breastfeeding. ? ?General appearance: alert, cooperative, and no distress ?Lungs: normal work of breathing on room air  ?Heart: normal rate, warm and well perfused  ?Abdomen: soft, non-tender, gravid  ?Extremities: no LE edema or calf tenderness to palpation  ? ?Presentation:  Cephalic ?Fetal monitoring: Baseline 155 bpm, moderate variability, + accels, no decels  ?Uterine activity: Every 3 minutes  ?Dilation: 2 ?Effacement (%): 50 ?Station: -2 ?Exam by:: Wyvonnia Lora RN ? ? 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 ?Results for orders placed or performed during the hospital encounter of 09/24/21 (from the past 24 hour(s))  ?CBC  ? Collection Time: 09/24/21 10:04 AM  ?Result Value Ref Range  ? WBC 7.9 4.0 - 10.5 K/uL  ? RBC 4.48 3.87 - 5.11 MIL/uL  ? Hemoglobin 9.4 (L) 12.0 - 15.0 g/dL  ? HCT 29.3 (L) 36.0 - 46.0 %  ? MCV 65.4 (L) 80.0 - 100.0 fL  ? MCH 21.0 (L) 26.0 - 34.0 pg  ? MCHC 32.1 30.0 - 36.0 g/dL   ? RDW 19.4 (H) 11.5 - 15.5 %  ? Platelets 306 150 - 400 K/uL  ? nRBC 0.0 0.0 - 0.2 %  ?Type and screen  ? Collection Time: 09/24/21 10:04 AM  ?Result Value Ref Range  ? ABO/RH(D) O POS   ? Antibody Screen NEG   ? Sample Expiration    ?  09/27/2021,2359 ?Performed at [facility], [place],  [place] [address] ?  ? ? ?Patient Active Problem List  ? Diagnosis Date Noted  ? Supervision of low-risk first pregnancy, third trimester 09/24/2021  ? 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  ? Anemia in pregnancy, third trimester 07/12/2021  ? Supervision of low-risk pregnancy 02/09/2021  ? ? ?Assessment/Plan:  ?Rhonda Evans is a 26 y.o. G2P1001 at 73w5dhere for eIOL.  ? ?#Labor: Will start Pitocin 2x2. Plan for AROM on next check.  ?#Pain: PRN ?#FWB: Cat 1  ?#ID:  GBS neg ?#MOF: Breast  ?#MOC: IUD outpatient  ? ?#Iron deficiency anemia: Hgb 9.4 on admission. Will plan to reassess and replete as needed postpartum.  ? ?CGenia Del MD  ?09/24/2021, 10:58 AM ? ? ? ?

## 2021-09-25 LAB — CBC
HCT: 26 % — ABNORMAL LOW (ref 36.0–46.0)
Hemoglobin: 8.3 g/dL — ABNORMAL LOW (ref 12.0–15.0)
MCH: 20.7 pg — ABNORMAL LOW (ref 26.0–34.0)
MCHC: 31.9 g/dL (ref 30.0–36.0)
MCV: 64.8 fL — ABNORMAL LOW (ref 80.0–100.0)
Platelets: 262 10*3/uL (ref 150–400)
RBC: 4.01 MIL/uL (ref 3.87–5.11)
RDW: 19 % — ABNORMAL HIGH (ref 11.5–15.5)
WBC: 11.7 10*3/uL — ABNORMAL HIGH (ref 4.0–10.5)
nRBC: 0 % (ref 0.0–0.2)

## 2021-09-25 LAB — RPR: RPR Ser Ql: NONREACTIVE

## 2021-09-25 NOTE — Lactation Note (Signed)
This note was copied from a baby's chart. ?Lactation Consultation Note ? ?Patient Name: Rhonda Evans ?Today's Date: 09/25/2021 ?Reason for consult: Follow-up assessment;Mother's request;Difficult latch;1st time breastfeeding;Term;Breastfeeding assistance ?Age:26 years ? ?Mom extended periods not able to get infant to latch. We removed outfit and blanket and placed her STS. Infant best latch with more depth on breast football. Mom denied any pain with the latch after changes made.  ? ?Plan 1 To feed based on cues 8-12x 24hr period. Mom to offer breasts and look for signs of milk transfer.  ?2. Mom to hand express and offer EBM on spoon 5-7 ml then try a latch.  ? ?Mom to call for latch assistance from RN or LC when needed going forward.  ?All questions answered at the end of the visit.  ? ?Maternal Data ?Has patient been taught Hand Expression?: Yes ? ?Feeding ?Mother's Current Feeding Choice: Breast Milk ? ?LATCH Score ?Latch: Repeated attempts needed to sustain latch, nipple held in mouth throughout feeding, stimulation needed to elicit sucking reflex. ? ?Audible Swallowing: Spontaneous and intermittent ? ?Type of Nipple: Everted at rest and after stimulation ? ?Comfort (Breast/Nipple): Soft / non-tender (Best latch position to get more depth on the breast was football.) ? ?Hold (Positioning): Assistance needed to correctly position infant at breast and maintain latch. ? ?LATCH Score: 8 ? ? ?Lactation Tools Discussed/Used ?  ? ?Interventions ?Interventions: Breast feeding basics reviewed;Assisted with latch;Skin to skin;Breast massage;Hand express;Breast compression;Adjust position;Support pillows;Position options;Expressed milk;Education;Infant Driven Feeding Algorithm education ? ?Discharge ?Pump: Manual ?WIC Program: Yes ? ?Consult Status ?Consult Status: Follow-up ?Date: 09/26/21 ?Follow-up type: In-patient ? ? ? ?Rhonda Legere  Evans ?09/25/2021, 6:47 PM ? ? ? ?

## 2021-09-25 NOTE — Progress Notes (Signed)
POSTPARTUM PROGRESS NOTE ? ?Post Partum Day 1 ? ?Subjective: ? ?Rhonda Evans is a 26 y.o. [id number] s/p VD at [redacted]w[redacted]d.  She reports she is doing well. No acute events overnight. She denies any problems with ambulating, voiding or po intake. Denies nausea or vomiting.  Pain is well controlled.  Lochia is mild. ? ?Objective: ?Blood pressure 118/78, pulse 78, temperature 98.4 ?F (36.9 ?C), temperature source Oral, resp. rate 18, height 5\' 5"  (1.651 m), weight 83.9 kg, last menstrual period 12/14/2020, SpO2 100 %, unknown if currently breastfeeding. ? ?Physical Exam:  ?General: alert, cooperative and no distress ?Chest: no respiratory distress ?Uterine Fundus: firm, appropriately tender ?DVT Evaluation: No calf swelling or tenderness ?Extremities: minimal edema ?Skin: warm, dry ? ?Recent Labs  ?  09/24/21 ?1004 09/25/21 ?0438  ?HGB 9.4* 8.3*  ?HCT 29.3* 26.0*  ? ? ?Assessment/Plan: ?Rhonda Evans is a 26 y.o. [id number] s/p VD at [redacted]w[redacted]d  ? ?PPD#1 - Doing well ? Routine postpartum care ? ?Acute blood loss anemia: Hgb 8.3 this am. Asymptomatic. Agrees to IV venofer.  ? ?Contraception: IUD outpatient but still considering other options  ?Feeding: Breast ? ?Dispo: Plan for discharge tomorrow. ? ? LOS: 1 day  ? ?Darrelyn Hillock, DO  ?OB Fellow  ?09/25/2021, 7:28 AM  ?

## 2021-09-25 NOTE — Social Work (Signed)
CSW received consult for hx of Anxiety and Depression.  CSW met with MOB to offer support and complete assessment.   ? ?CSW met with MOB at bedside and introduced CSW role. CSW observed MOB breastfeeding and bonding with the infant. MOB presented calm and welcomed CSW visit. CSW inquired how MOB has felt since giving birth. MOB reported feeling great and shared that she ?feels so proud? since she delivered the infant without pain medication. CSW praised MOB efforts. CSW inquired how MOB felt emotionally during the pregnancy. MOB reported at the beginning of the pregnancy felt ?shocked and cried? because was not planning to have a child at this time. MOB reported after seeing the baby on the first ultrasound she "fell in love" and anticipated meeting the baby. MOB reported she was diagnosed with depression and anxiety in 2019-2020. MOB reported she was she was prescribed 2-3 different medications and was seeing a therapist "Christina" at [facility] which she felt was the most helpful as she could talk about how she was feeling. CSW inquired about MOB coping strategies. MOB reported, " I have moments where I shut people out." MOB explained it gives her time to focus and care for herself. CSW encouraged MOB to also do things that make her happy and bring her joy. MOB reported understanding. CSW discussed PPD. MOB reported she did not experienced PPD/anxiety with her older child but was receptive to PPD recourses.  ? ?CSW provided education regarding the baby blues period vs. perinatal mood disorders, discussed treatment and gave resources for mental health follow up if concerns arise.  CSW recommends self-evaluation during the postpartum time period using the New Mom Checklist from Postpartum Progress and encouraged MOB to contact a medical professional if symptoms are noted at any time.  MOB reported ?on a scale from 1-10, I am a ten,? when it comes to reaching out to her doctor if she has concerns. CSW  inquired about MOB supports. MOB identified FOB and her family as supports. CSW assessed MOB for safety. MOB denied thoughts of harm to self and others.  ? ?CSW provided review of Sudden Infant Death Syndrome (SIDS) precautions. MOB reported she has essential items for the infant including a bassinet where the infant will sleep. MOB reported she is still deciding on a pediatrician for the infant. MOB reported she receives both WIC/FS and has planned to to call and update both agencies about the birth. CSW assessed MOB for additional needs. MOB reported no further need.  ? ?CSW identifies no further need for intervention and no barriers to discharge at this time.  ? ? ?Rhonda Evans, MSW, LCSW ?[facility]  ?Clinical Social Worker  ?[phone] ?09/25/2021  3:09 PM  ?

## 2021-09-25 NOTE — Lactation Note (Signed)
This note was copied from a baby's chart. ?Lactation Consultation Note ?Baby was BF when LC came into rm. ?Mom stated she had to use the BR really bad and that the baby had been on the breast about 30 minutes. ?LC demonstrated how to break a latch. Baby swaddled and placed in basinet. Baby sleeping soundly. ?Newborn feeding habits, STS, I&O, support during feeding reviewed. ?Encouraged to call for assistance or questions as needed. ? ?Patient Name: Rhonda Evans ?Today's Date: 09/25/2021 ?Reason for consult: Initial assessment;1st time breastfeeding;Term ?Age:25 hours ? ?Maternal Data ?Does the patient have breastfeeding experience prior to this delivery?: No ? ?Feeding ?  ? ?LATCH Score ?Latch: Grasps breast easily, tongue down, lips flanged, rhythmical sucking. ? ?Audible Swallowing: None ? ?Type of Nipple: Everted at rest and after stimulation ? ?Comfort (Breast/Nipple): Soft / non-tender ? ?Hold (Positioning): No assistance needed to correctly position infant at breast. ? ?LATCH Score: 8 ? ? ?Lactation Tools Discussed/Used ?  ? ?Interventions ?Interventions: Breast feeding basics reviewed;LC Services brochure ? ?Discharge ?  ? ?Consult Status ?Consult Status: Follow-up ?Date: 09/25/21 ?Follow-up type: In-patient ? ? ? ?Charyl Dancer ?09/25/2021, 1:29 AM ? ? ? ?

## 2021-09-26 MED ORDER — IBUPROFEN 600 MG PO TABS: 600.0000 mg | ORAL_TABLET | Freq: Four times a day (QID) | ORAL | 0 refills | Status: DC | PRN

## 2021-09-26 MED ORDER — ACETAMINOPHEN 500 MG PO TABS: 1000.0000 mg | ORAL_TABLET | Freq: Three times a day (TID) | ORAL | 0 refills | Status: DC | PRN

## 2021-09-26 NOTE — Lactation Note (Signed)
This note was copied from a baby's chart. ?Lactation Consultation Note ? ?Patient Name: Rhonda Evans ?Today's Date: 09/26/2021 ?Reason for consult: Follow-up assessment ?Age:25 hours ? ?P2, Answered questions on how to increase milk supply. ?Reviewed engorgement care and monitoring voids/stools. ?No other questions at this time.  ? ?Feeding ?Mother's Current Feeding Choice: Breast Milk ? ?Interventions ?Interventions: Education ? ?Discharge ?Discharge Education: Engorgement and breast care;Warning signs for feeding baby ? ?Consult Status ?Consult Status: Complete ?Date: 09/26/21 ? ? ? ?Dahlia Byes Boschen ?09/26/2021, 10:05 AM ? ? ? ?

## 2021-09-27 ENCOUNTER — Encounter: Payer: Self-pay | Admitting: Certified Nurse Midwife

## 2021-10-04 ENCOUNTER — Telehealth (HOSPITAL_COMMUNITY): Payer: Self-pay | Admitting: *Deleted

## 2021-10-04 NOTE — Telephone Encounter (Signed)
Patient stated, "I've had a knot in my breast since before I got pregnant, and it's not getting any smaller. I need to have it looked at." RN referred patient to her OB for follow-up. Patient verbalized understanding. Patient voiced no other questions or concerns regarding her health at this time. EPDS=1. Patient reported that infant's umbilical cord has fallen off, but the "skin looks white." Denied drainage, puss, fever, or redness at the site. RN instructed patient that it is likely just the color of the skin under the scab as it heals. Instructed patient to call pediatrician if the above signs of infection occur. Also instructed patient to keep the area clean and dry until it has completely healed. Patient verbalized understanding. Patient voiced no other questions or concerns regarding infant at this time. Patient reports infant sleeps in the bed with her at this time. Stated, "I have a crib, but I haven't put it up yet. I am moving on Friday, and I will put it up then." RN reviewed ABCs of safe sleep. Patient verbalized understanding. Patient requested RN email information on hospital's virtual postpartum classes and support groups. Email sent. Deforest Hoyles, RN, 10/04/21, 1929  ?

## 2021-10-09 ENCOUNTER — Encounter: Payer: Self-pay | Admitting: *Deleted

## 2021-10-23 ENCOUNTER — Ambulatory Visit (INDEPENDENT_AMBULATORY_CARE_PROVIDER_SITE_OTHER): Payer: Medicaid Other | Admitting: Obstetrics and Gynecology

## 2021-10-23 ENCOUNTER — Encounter: Payer: Self-pay | Admitting: Obstetrics and Gynecology

## 2021-10-23 ENCOUNTER — Other Ambulatory Visit (HOSPITAL_COMMUNITY)
Admission: RE | Admit: 2021-10-23 | Discharge: 2021-10-23 | Disposition: A | Discharge status: Home / Self Care | Payer: Medicaid Other | Source: Ambulatory Visit | Attending: Obstetrics and Gynecology | Admitting: Obstetrics and Gynecology

## 2021-10-23 VITALS — BP 122/85 | HR 87 | Wt 160.3 lb

## 2021-10-23 DIAGNOSIS — R87612 Low grade squamous intraepithelial lesion on cytologic smear of cervix (LGSIL): Secondary | ICD-10-CM | POA: Insufficient documentation

## 2021-10-23 LAB — POCT PREGNANCY, URINE: Preg Test, Ur: NEGATIVE

## 2021-10-23 NOTE — Progress Notes (Signed)
Patient given informed consent, signed copy in the chart, time out was performed.  Placed in lithotomy position.UPT neg, Chart reviewed with LGSIL noted Cervix viewed with speculum and colposcope after application of acetic acid.  ? ?Colposcopy adequate?  no ?Acetowhite lesions?yes, a band noted from 9 to 2 oclock, areas of gray to denser white changes noted ?Punctation?no ?Mosaicism?   ?Abnormal vasculature?   ?Biopsies? 2 at 11 and 2 oclock ?ECC? yes ? ?COMMENTS: ?Patient was given post procedure instructions.  She will return in 2 weeks for results and postpartum exam. ? ?Warden Fillers, MD  ?

## 2021-10-25 LAB — SURGICAL PATHOLOGY

## 2021-10-26 ENCOUNTER — Telehealth: Payer: Self-pay

## 2021-10-26 NOTE — Telephone Encounter (Addendum)
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 ? ?Called patient; results and provider recommendation given. Explained we are waiting for supplies to be received and front office will call once we are able to schedule.  ?

## 2021-11-06 ENCOUNTER — Encounter: Payer: Self-pay | Admitting: Obstetrics and Gynecology

## 2021-11-06 ENCOUNTER — Ambulatory Visit (INDEPENDENT_AMBULATORY_CARE_PROVIDER_SITE_OTHER): Payer: Medicaid Other | Admitting: Obstetrics and Gynecology

## 2021-11-06 ENCOUNTER — Ambulatory Visit: Payer: Self-pay | Admitting: Obstetrics and Gynecology

## 2021-11-06 ENCOUNTER — Encounter: Payer: Self-pay | Admitting: General Practice

## 2021-11-06 DIAGNOSIS — N6312 Unspecified lump in the right breast, upper inner quadrant: Secondary | ICD-10-CM

## 2021-11-06 DIAGNOSIS — N6310 Unspecified lump in the right breast, unspecified quadrant: Secondary | ICD-10-CM

## 2021-11-06 HISTORY — DX: Unspecified lump in the right breast, unspecified quadrant: N63.10

## 2021-11-06 NOTE — Patient Instructions (Signed)
Claritin preferred for allergy issues while breast feeding ?Colace and senokot  for constipation increase hydration ?

## 2021-11-06 NOTE — Progress Notes (Signed)
? ? ?Post Partum Visit Note ? ?Rhonda Evans is a 26 y.o. G51P2002 female who presents for a postpartum visit. She is 6 weeks 1 day postpartum following a normal spontaneous vaginal delivery.  I have fully reviewed the prenatal and intrapartum course. The delivery was at 39w 5d gestational weeks.  Anesthesia: none. Postpartum course has been "just ok." Rhonda Evans is doing well. Baby is feeding by breast. Bleeding no bleeding. Bowel function is abnormal: constipation . Bladder function is normal. Patient is not sexually active. Contraception method is IUD. Postpartum depression screening: positive. ? ? ?The pregnancy intention screening data noted above was reviewed. Potential methods of contraception were discussed. The patient elected to proceed with No data recorded. ? ? Edinburgh Postnatal Depression Scale - 11/06/21 0851   ? ?  ? Edinburgh Postnatal Depression Scale:  In the Past 7 Days  ? I have been able to laugh and see the funny side of things. 0   ? I have looked forward with enjoyment to things. 0   ? I have blamed myself unnecessarily when things went wrong. 2   ? I have been anxious or worried for no good reason. 2   ? I have felt scared or panicky for no good reason. 2   ? Things have been getting on top of me. 3   ? I have been so unhappy that I have had difficulty sleeping. 0   ? I have felt sad or miserable. 2   ? I have been so unhappy that I have been crying. 1   ? The thought of harming myself has occurred to me. 0   ? Edinburgh Postnatal Depression Scale Total 12   ? ?  ?  ? ?  ? ? ?Health Maintenance Due  ?Topic Date Due  ? COVID-19 Vaccine (1) Never done  ? HPV VACCINES (1 - 2-dose series) Never done  ? ? ?The following portions of the patient's history were reviewed and updated as appropriate: allergies, current medications, past family history, past medical history, past social history, past surgical history, and problem list. ? ?Review of Systems ?Pertinent items are noted in HPI. ? ?Objective:   ?BP 107/81   Pulse 95   Wt 159 lb 4.8 oz (72.3 kg)   Breastfeeding Yes   BMI 26.51 kg/m?   ? ?General:  alert, cooperative, and no distress  ? Breasts:  Palpable right breast mass, feels cystic, at 10-11 oclock  ?Lungs: clear to auscultation bilaterally  ?Heart:  regular rate and rhythm  ?Abdomen: soft, non-tender; bowel sounds normal; no masses,  no organomegaly   ?Wound N/a  ?GU exam:  not indicated  ?     ?Assessment:  ? ?  ?normal postpartum exam.  ? ?Plan:  ? ?Essential components of care per ACOG recommendations: ? ?1.  Mood and well being: Patient with positive depression screening today. Reviewed local resources for support.  ?- Patient tobacco use? No.   ?- hx of drug use? No.   ? ?2. Infant care and feeding:  ?-Patient currently breastmilk feeding? Yes. Reviewed importance of draining breast regularly to support lactation.  ?-Social determinants of health (SDOH) reviewed in EPIC. The following needs were identified food insecurity ? ?3. Sexuality, contraception and birth spacing ?- Patient does not want a pregnancy in the next year.  Desired family size is 2 children.  ?- Reviewed reproductive life planning. Reviewed contraceptive methods based on pt preferences and effectiveness.  Patient desired IUD or IUS today.   ?-  Discussed birth spacing of 18 months ? ?4. Sleep and fatigue ?-Encouraged family/partner/community support of 4 hrs of uninterrupted sleep to help with mood and fatigue ? ?5. Physical Recovery  ?- Discussed patients delivery and complications. She describes her labor as good. ?- Patient had a Vaginal, no problems at delivery. Patient had no lacerations. Perineal healing reviewed. Patient expressed understanding ?- Patient has urinary incontinence? No. ?- Patient is safe to resume physical and sexual activity ? ?6.  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 Pap smear not done at today's visit. 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 No.  ? ?7. Chronic Disease/Pregnancy Condition follow up: right breast mass, will get breast ultrasound ? ?- PCP follow up ?Will schedule LEEP in 1 month ?Warden Fillers, MD ?[facility] for [employer], [facility] ? ?

## 2021-11-17 ENCOUNTER — Ambulatory Visit
Admission: RE | Admit: 2021-11-17 | Discharge: 2021-11-17 | Disposition: A | Discharge status: Home / Self Care | Payer: Medicaid Other | Source: Ambulatory Visit | Attending: Obstetrics and Gynecology | Admitting: Obstetrics and Gynecology

## 2021-11-17 ENCOUNTER — Other Ambulatory Visit: Payer: Self-pay | Admitting: Obstetrics and Gynecology

## 2021-11-17 DIAGNOSIS — N6312 Unspecified lump in the right breast, upper inner quadrant: Secondary | ICD-10-CM

## 2021-11-17 DIAGNOSIS — N6310 Unspecified lump in the right breast, unspecified quadrant: Secondary | ICD-10-CM

## 2021-12-06 ENCOUNTER — Ambulatory Visit: Payer: Medicaid Other | Admitting: Obstetrics and Gynecology

## 2021-12-19 ENCOUNTER — Telehealth: Payer: Self-pay | Admitting: Lactation Services

## 2021-12-19 MED ORDER — FLUCONAZOLE 100 MG PO TABS: ORAL_TABLET | ORAL | 0 refills | Status: DC

## 2021-12-19 NOTE — Telephone Encounter (Signed)
Called and spoke with mom. She has pain in her right breast that has been happening for 2 weeks.   She reports pain comes and goes. She is having pain in the breast throughout the feeding and afterwards. It is a burning shooting pain in the right breast. Mom reports no nipple pain and reports nipple is longer and is not sure if compressed.   Infant has had Thrush and she applied Nystatin to her nipples when infant had Thrush, infant is 2-3 months old. She is unsure of how long ago infant was treated.   She is not noting any Thrush in infant's mouth now.   She denies any milk blebs and no redness to the breast.   Reviewed with patient that it sounds like she has intraductal yeast and reviewed treatment with her. Diflucan sent to [facility] on [address] at patients request per Standing order.   Reviewed washing bras, towels and washcloths daily in hot soapy water and dry in hot dryer.   Patient to call back if not improving after Diflucan dosage.

## 2022-01-18 ENCOUNTER — Encounter: Payer: Self-pay | Admitting: Obstetrics and Gynecology

## 2022-01-18 ENCOUNTER — Ambulatory Visit (INDEPENDENT_AMBULATORY_CARE_PROVIDER_SITE_OTHER): Payer: Medicaid Other | Admitting: Obstetrics and Gynecology

## 2022-01-18 ENCOUNTER — Other Ambulatory Visit (HOSPITAL_COMMUNITY)
Admission: RE | Admit: 2022-01-18 | Discharge: 2022-01-18 | Disposition: A | Discharge status: Home / Self Care | Payer: Medicaid Other | Source: Ambulatory Visit | Attending: Obstetrics and Gynecology | Admitting: Obstetrics and Gynecology

## 2022-01-18 ENCOUNTER — Other Ambulatory Visit: Payer: Self-pay

## 2022-01-18 DIAGNOSIS — N871 Moderate cervical dysplasia: Secondary | ICD-10-CM | POA: Insufficient documentation

## 2022-01-18 DIAGNOSIS — Z3202 Encounter for pregnancy test, result negative: Secondary | ICD-10-CM

## 2022-01-18 LAB — POCT PREGNANCY, URINE: Preg Test, Ur: NEGATIVE

## 2022-01-18 NOTE — Progress Notes (Signed)
Patient identified, informed consent obtained, signed copy in chart, time out performed.  Pap smear and colposcopy reviewed.   Pap:  LGSIL Colpo Biopsy Cin 2-3 ECC negative Teflon coated speculum with smoke evacuator placed.  Cervix visualized. Paracervical block placed. Acetic acid and Lugol's solution placed to localize dysplasia medium size LOOP used to remove cone of cervix using blend of cut and cautery on LEEP machine.  Edges/Base cauterized with Ball.  Monsel's solution used for hemostasis.  Patient tolerated procedure well.  Patient given post procedure instructions.  Follow up in 1 month for LEEP follow up.

## 2022-01-22 LAB — SURGICAL PATHOLOGY

## 2022-03-12 IMAGING — US US OB COMP LESS 14 WK
1 series · 15 of 28 positions shown · non-contrast
Comparison: None.

CLINICAL DATA: Unsure of LMP.  Irregular menses.

EXAM:
OBSTETRIC <14 WK ULTRASOUND
TECHNIQUE: Transabdominal ultrasound was performed for evaluation of the
gestation as well as the maternal uterus and adnexal regions.

[Series 1: us ob comp less 14 wk · 15 of 64 slices shown]
[im 1/64]
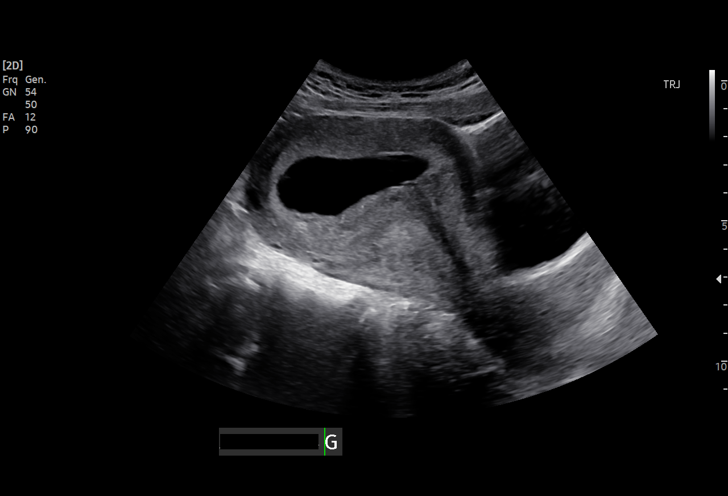
[im 5/64]
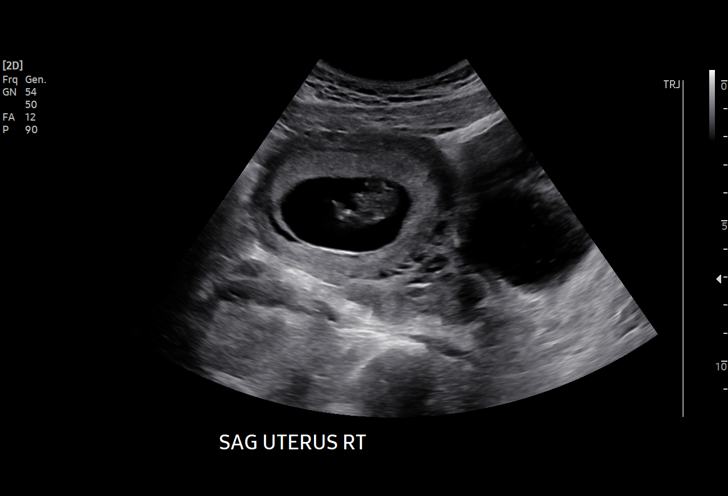
[im 10/64]
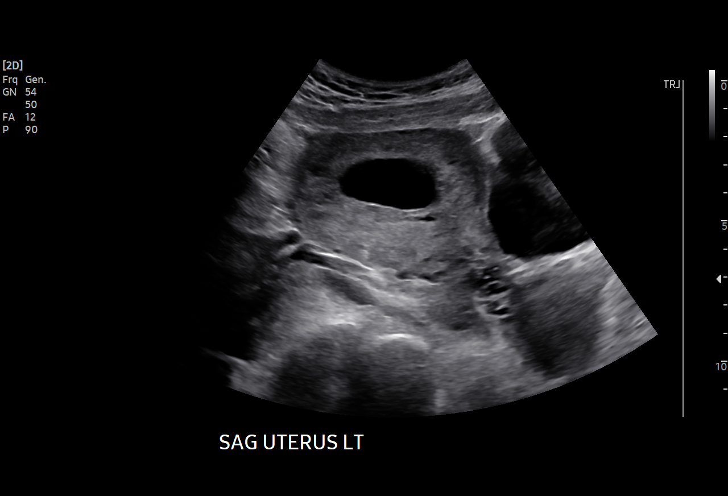
[im 15/64]
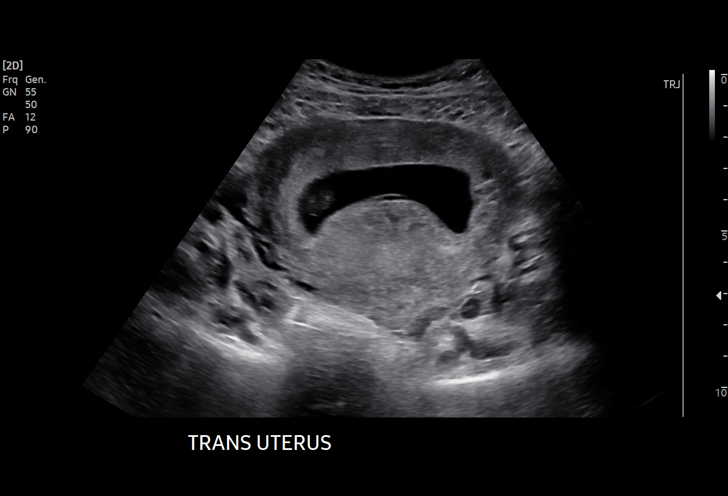
[im 19/64]
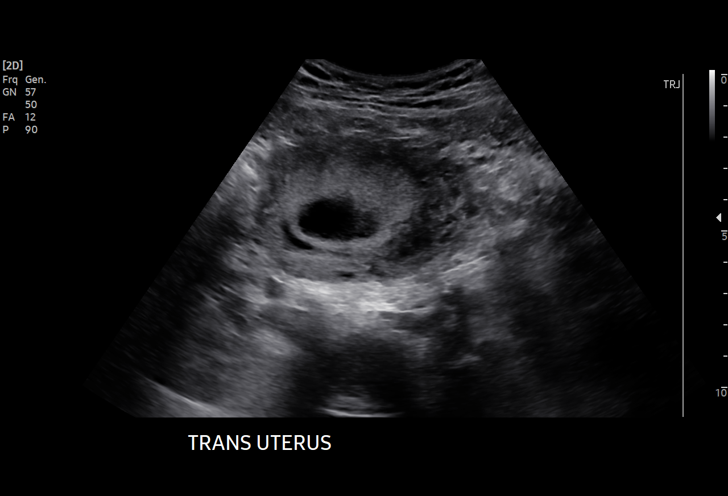
[im 24/64]
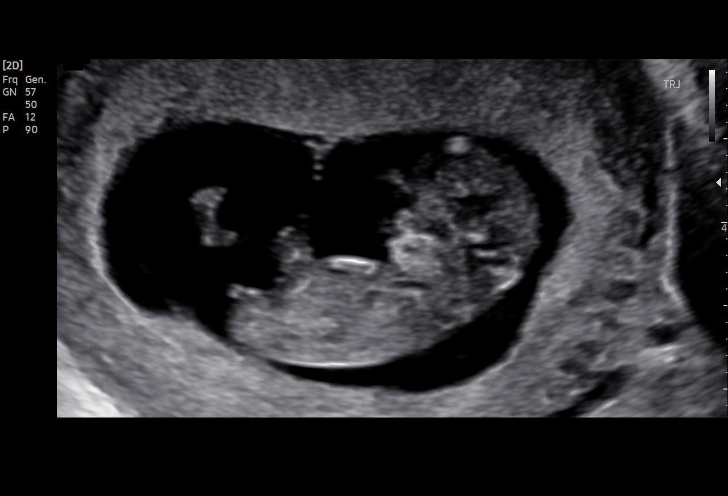
[im 29/64]
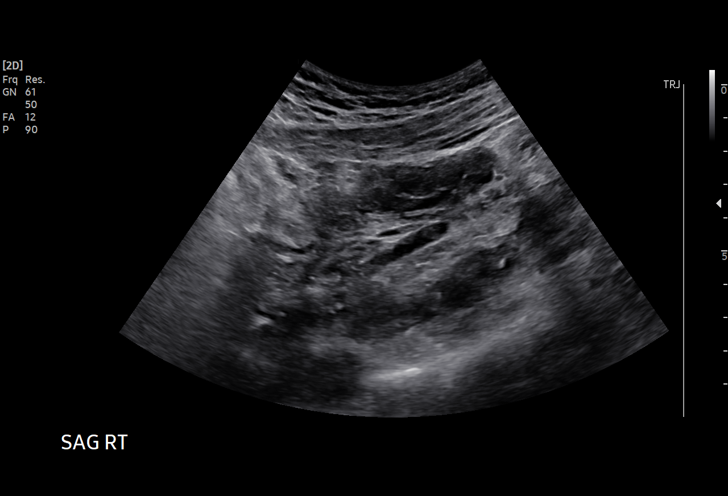
[im 33/64]
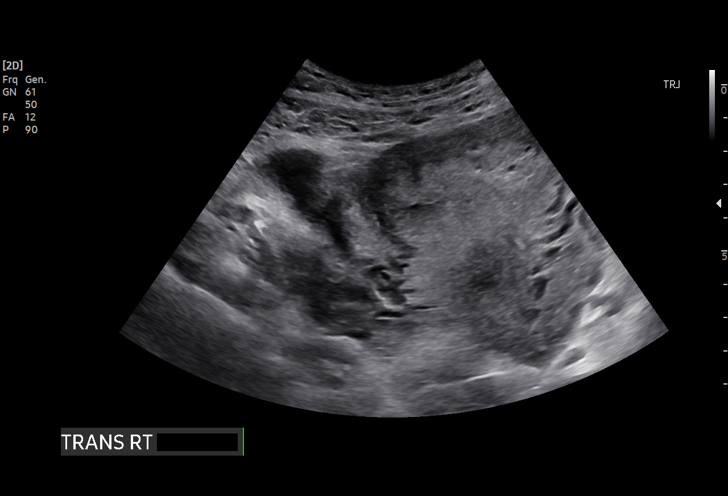
[im 36/64]
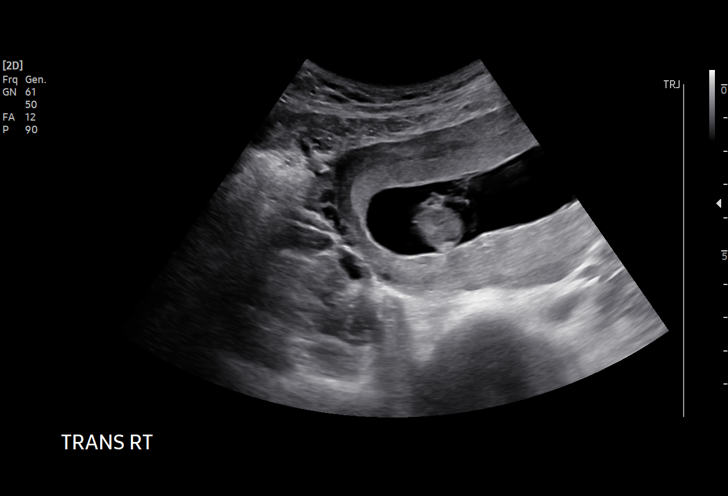
[im 40/64]
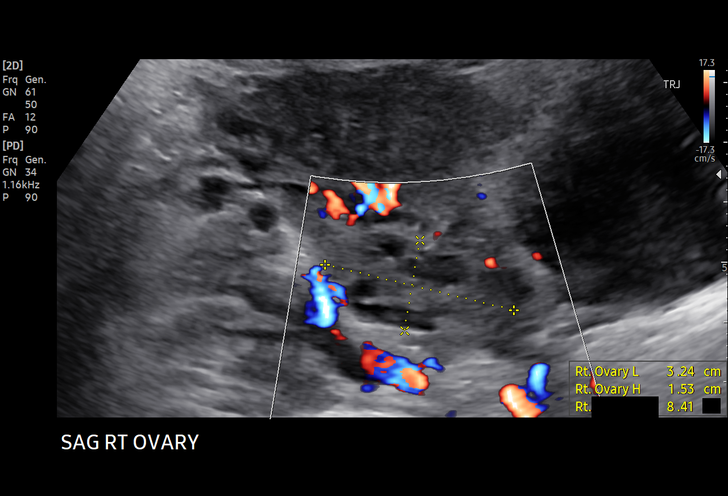
[im 45/64]
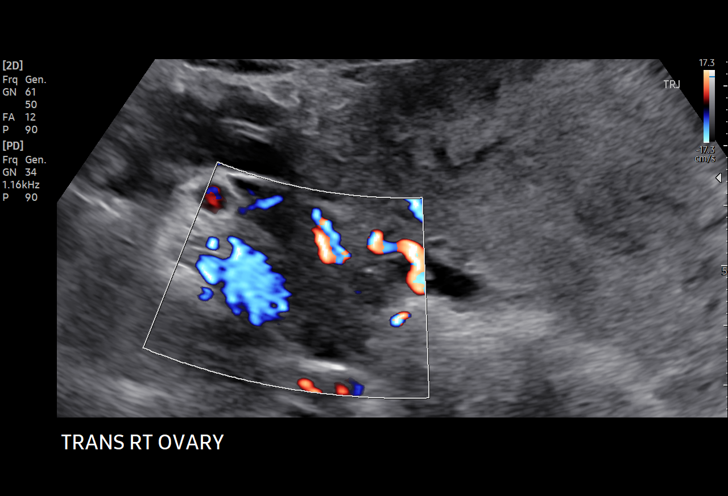
[im 50/64]
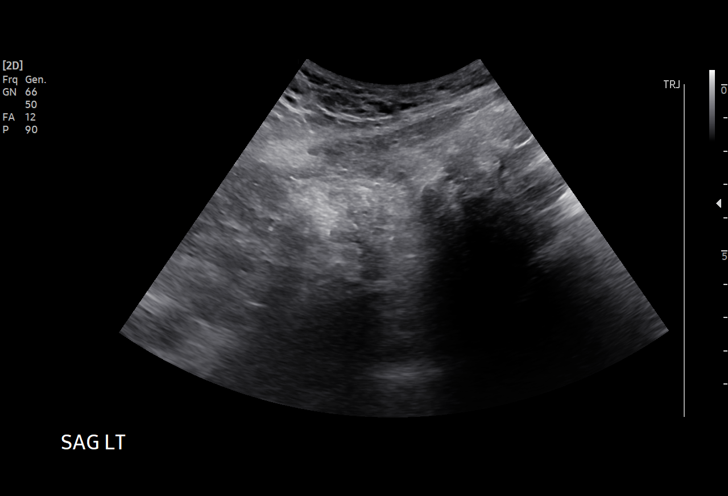
[im 54/64]
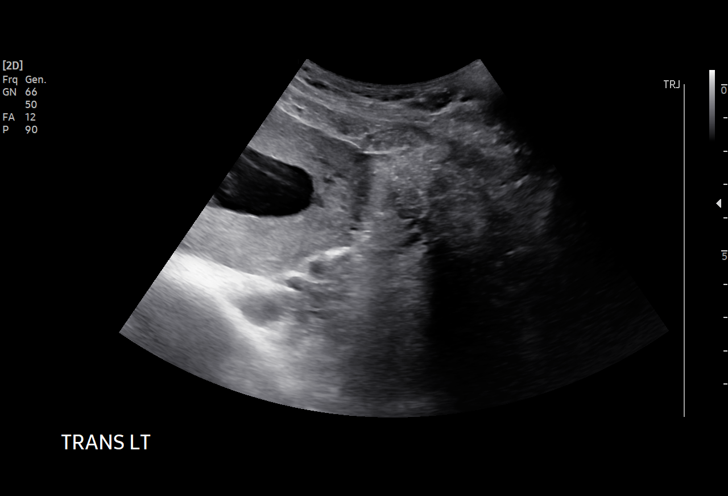
[im 59/64]
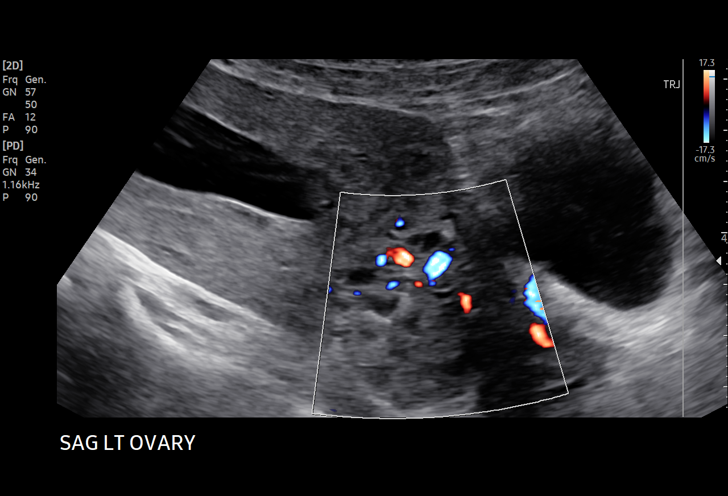
[im 64/64]
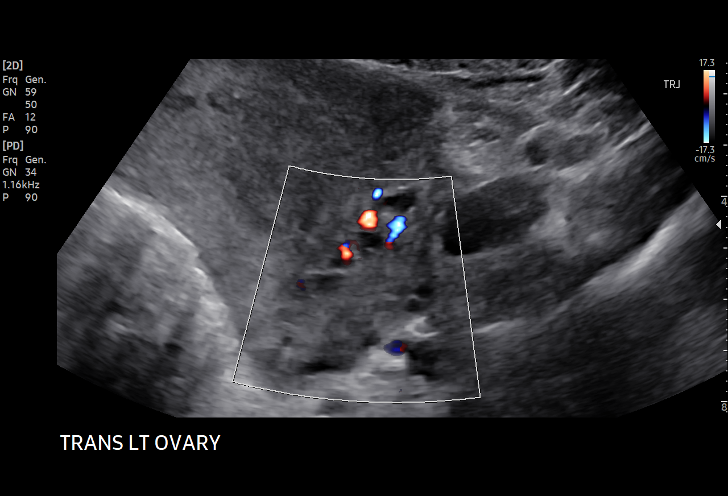

[15 of 28 positions shown; findings below may reference images not displayed]

FINDINGS: Intrauterine gestational sac: Single

Yolk sac:  Not Visualized.

Embryo:  Visualized.

Cardiac Activity: Visualized.

Heart Rate: 173 bpm

CRL:   40 mm   10 w 6 d                  US EDC: 09/26/2021

Subchorionic hemorrhage:  None visualized.

Maternal uterus/adnexae: Both ovaries are normal in appearance. No
masses or abnormal free fluid identified.
IMPRESSION: Single living IUP with estimated gestational age of 10 weeks 6 days,
and US EDC of 09/26/2021.

## 2022-03-15 ENCOUNTER — Ambulatory Visit: Payer: Medicaid Other | Admitting: Obstetrics and Gynecology

## 2022-05-22 ENCOUNTER — Ambulatory Visit
Admission: RE | Admit: 2022-05-22 | Discharge: 2022-05-22 | Disposition: A | Discharge status: Home / Self Care | Payer: Medicaid Other | Source: Ambulatory Visit | Attending: Obstetrics and Gynecology | Admitting: Obstetrics and Gynecology

## 2022-05-22 DIAGNOSIS — N6310 Unspecified lump in the right breast, unspecified quadrant: Secondary | ICD-10-CM

## 2022-06-15 ENCOUNTER — Encounter (HOSPITAL_BASED_OUTPATIENT_CLINIC_OR_DEPARTMENT_OTHER): Payer: Self-pay

## 2022-06-15 ENCOUNTER — Other Ambulatory Visit (HOSPITAL_BASED_OUTPATIENT_CLINIC_OR_DEPARTMENT_OTHER): Payer: Self-pay

## 2022-06-15 ENCOUNTER — Other Ambulatory Visit: Payer: Self-pay

## 2022-06-15 ENCOUNTER — Emergency Department (HOSPITAL_BASED_OUTPATIENT_CLINIC_OR_DEPARTMENT_OTHER)
Admission: EM | Admit: 2022-06-15 | Discharge: 2022-06-15 | Disposition: A | Discharge status: Home / Self Care | Payer: Medicaid Other | Attending: Emergency Medicine | Admitting: Emergency Medicine

## 2022-06-15 DIAGNOSIS — Z1152 Encounter for screening for COVID-19: Secondary | ICD-10-CM | POA: Insufficient documentation

## 2022-06-15 DIAGNOSIS — J020 Streptococcal pharyngitis: Secondary | ICD-10-CM | POA: Insufficient documentation

## 2022-06-15 DIAGNOSIS — Z91040 Latex allergy status: Secondary | ICD-10-CM | POA: Diagnosis not present

## 2022-06-15 DIAGNOSIS — J029 Acute pharyngitis, unspecified: Secondary | ICD-10-CM | POA: Diagnosis present

## 2022-06-15 LAB — RESP PANEL BY RT-PCR (RSV, FLU A&B, COVID)  RVPGX2
Influenza A by PCR: NEGATIVE
Influenza B by PCR: NEGATIVE
Resp Syncytial Virus by PCR: NEGATIVE
SARS Coronavirus 2 by RT PCR: NEGATIVE

## 2022-06-15 LAB — GROUP A STREP BY PCR: Group A Strep by PCR: DETECTED — AB

## 2022-06-15 MED ORDER — CLINDAMYCIN HCL 300 MG PO CAPS
300.0000 mg | ORAL_CAPSULE | Freq: Three times a day (TID) | ORAL | 0 refills | Status: AC
  Filled 2022-06-15: qty 30, 10d supply, fill #0

## 2022-06-15 NOTE — ED Triage Notes (Signed)
Pt presents with sore throat that started last night. Pt with a sister that was + for strep.   Pt also requesting an STD check. Denies known exposure and denies symptoms. Pt denies oral sex.

## 2022-06-15 NOTE — Discharge Instructions (Addendum)
You were diagnosed with strep throat at today's visit.  Please take the prescribed antibiotics as directed.  You may use Tylenol or Advil as needed for fever and pain control.

## 2022-06-15 NOTE — ED Provider Notes (Signed)
[facility] EMERGENCY DEPARTMENT Provider Note   CSN: [id number] Arrival date & time: 06/15/22  1335     History  Chief Complaint  Patient presents with   Sore Throat    Rhonda Evans is a 26 y.o. female.  Patient presents to the emergency department complaining of a sore throat which began last night.  Patient states that she has a sister who recently tested positive for strep throat.  She denies cough, abdominal pain, nausea, vomiting, urinary symptoms, vaginal discharge, vaginal pain.  Patient initially request STD testing but upon learning that she is positive for group A strep declines.  Patient is here with her daughter who tested positive for influenza A.  Patient also denies known fevers.  Past medical history significant for depression, anxiety  HPI     Home Medications Prior to Admission medications   Medication Sig Start Date End Date Taking? Authorizing Provider  clindamycin (CLEOCIN) 300 MG capsule Take 1 capsule (300 mg total) by mouth 3 (three) times daily for 10 days. 06/15/22 06/25/22 Yes Darrick Grinder, PA-C  fluconazole (DIFLUCAN) 100 MG tablet Take 2 tablets on day one and then one tablet daily for 13 days. Patient not taking: Reported on 01/18/2022 12/19/21   Reva Bores, MD  Prenatal 27-1 MG TABS Take 1 tablet by mouth daily. Patient not taking: Reported on 09/25/2021 02/23/21   Anyanwu, Jethro Bastos, MD  escitalopram (LEXAPRO) 20 MG tablet Take 1 tablet (20 mg total) by mouth daily. Patient not taking: Reported on 06/01/2019 12/04/18 10/27/20  Arfeen, Phillips Grout, MD  norethindrone-ethinyl estradiol (NECON 0.5/35, 28,) 0.5-35 MG-MCG tablet Take 1 tablet by mouth daily. Patient not taking: Reported on 06/01/2019 10/08/18 10/27/20  Gerrit Heck, CNM      Allergies    Amoxicillin and Latex    Review of Systems   Review of Systems  Constitutional:  Negative for fever.  HENT:  Positive for sore throat.   Respiratory:  Negative for shortness of breath.    Cardiovascular:  Negative for chest pain.  Gastrointestinal:  Negative for abdominal pain, nausea and vomiting.  Genitourinary:  Negative for dysuria and vaginal discharge.    Physical Exam Updated Vital Signs BP 115/76   Pulse (!) 102   Temp 98.2 F (36.8 C) (Oral)   Resp 14   Ht 5\' 5"  (1.651 m)   Wt 70.8 kg   LMP  (LMP Unknown)   SpO2 100%   BMI 25.96 kg/m  Physical Exam Vitals and nursing note reviewed.  Constitutional:      General: She is not in acute distress.    Appearance: She is well-developed.  HENT:     Head: Normocephalic and atraumatic.     Mouth/Throat:     Mouth: Mucous membranes are moist.     Pharynx: Uvula midline. Posterior oropharyngeal erythema present. No pharyngeal swelling.     Tonsils: Tonsillar exudate present. No tonsillar abscesses.  Eyes:     Conjunctiva/sclera: Conjunctivae normal.  Cardiovascular:     Rate and Rhythm: Normal rate and regular rhythm.     Heart sounds: No murmur heard. Pulmonary:     Effort: Pulmonary effort is normal. No respiratory distress.     Breath sounds: Normal breath sounds.  Abdominal:     Palpations: Abdomen is soft.     Tenderness: There is no abdominal tenderness.  Musculoskeletal:        General: No swelling.     Cervical back: Neck supple.  Skin:  General: Skin is warm and dry.     Capillary Refill: Capillary refill takes less than 2 seconds.  Neurological:     Mental Status: She is alert.  Psychiatric:        Mood and Affect: Mood normal.     ED Results / Procedures / Treatments   Labs (all labs ordered are listed, but only abnormal results are displayed) Labs Reviewed  GROUP A STREP BY PCR - Abnormal; Notable for the following components:      Result Value   Group A Strep by PCR DETECTED (*)    All other components within normal limits  RESP PANEL BY RT-PCR (RSV, FLU A&B, COVID)  [id number]  PREGNANCY, URINE    EKG None  Radiology No results found.  Procedures Procedures     Medications Ordered in ED Medications - No data to display  ED Course/ Medical Decision Making/ A&P                           Medical Decision Making Amount and/or Complexity of Data Reviewed Labs: ordered.   Patient presents with chief complaint sore throat.  Differential diagnosis includes but is not limited to strep pharyngitis, peritonsillar abscess, retropharyngeal abscess, and others  I reviewed the patient's past medical history.  The patient has multiple OB/GYN visits over the past year but no relevant visits to today's encounter.  The patient has no significant morbidities related to her chief complaint  I ordered and reviewed labs.  Pertinent results include positive group A strep by PCR.  There are no clinical signs of PTA or retropharyngeal abscess. There is no indication for imaging at this time.   The patient tested positive for group A strep which is consistent with her physical and history.  Patient has a reported anaphylactic reaction to amoxicillin so prescribed the patient clindamycin for antibiotic coverage.  Patient may use over-the-counter medications as needed for fever and pain control.        Final Clinical Impression(s) / ED Diagnoses Final diagnoses:  Strep pharyngitis    Rx / DC Orders ED Discharge Orders          Ordered    clindamycin (CLEOCIN) 300 MG capsule  3 times daily        06/15/22 1633              Pamala Duffel 06/15/22 1636    Glyn Ade, MD 06/18/22 [phone]

## 2022-11-23 IMAGING — US US BREAST*R* LIMITED INC AXILLA
1 series · 8 of 8 positions shown · non-contrast
Comparison: None Available.

CLINICAL DATA: RIGHT breast mass.

EXAM:
ULTRASOUND OF THE RIGHT BREAST

[Series 1: us breast*right* limited inc axilla · 0.06mm/px · 8 of 8 slices shown]
[im 1/8]
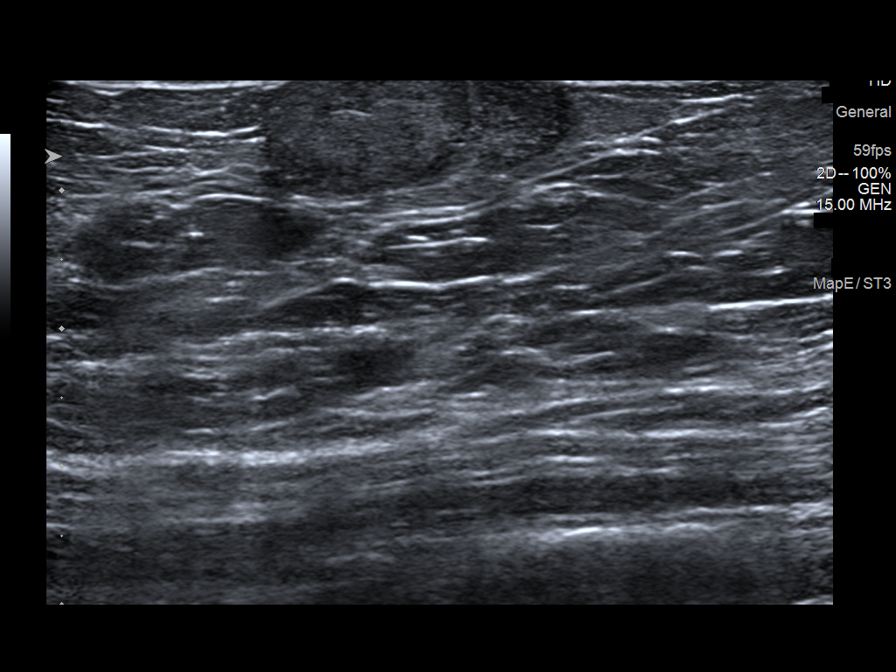
[im 2/8]
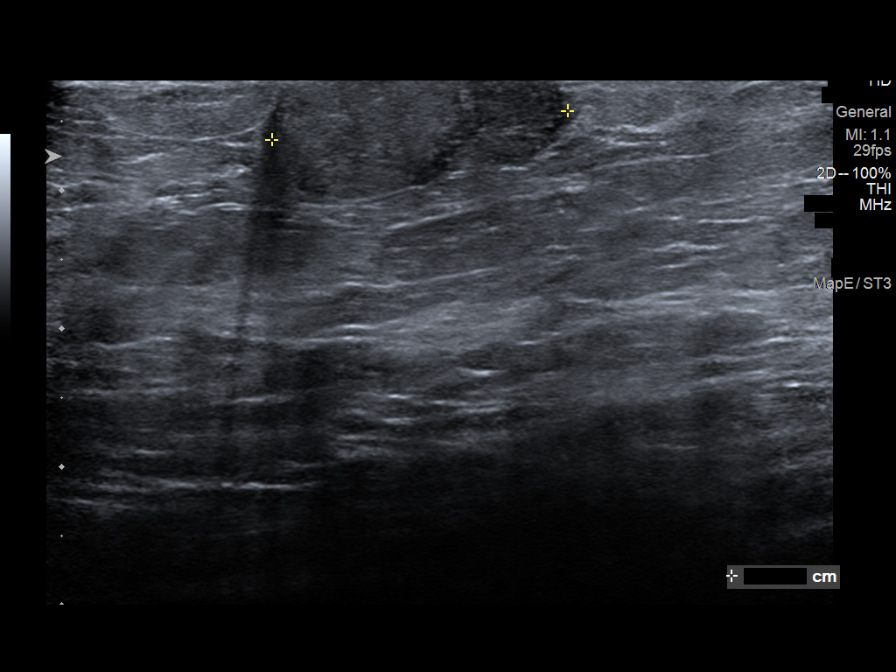
[im 3/8]
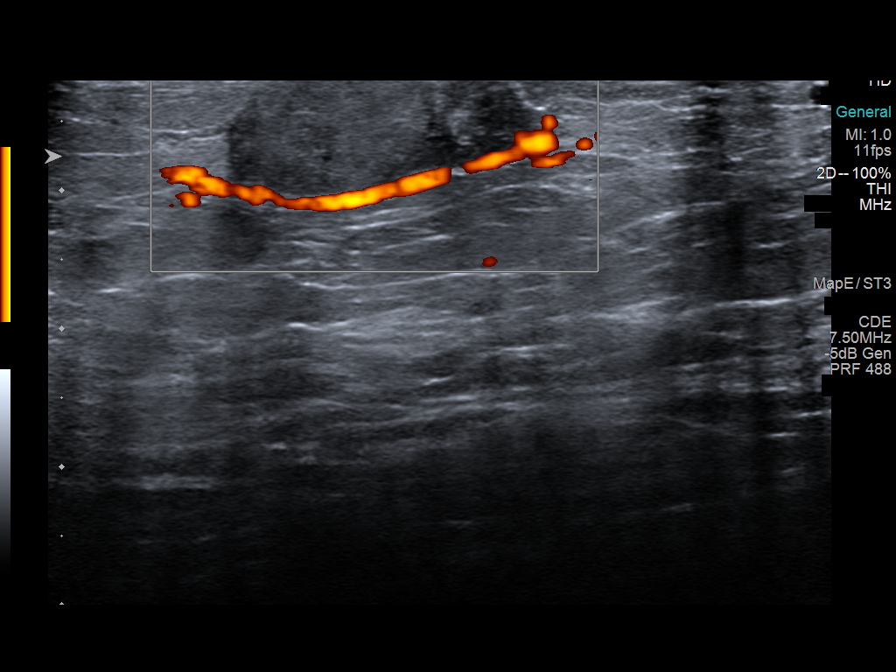
[im 4/8]
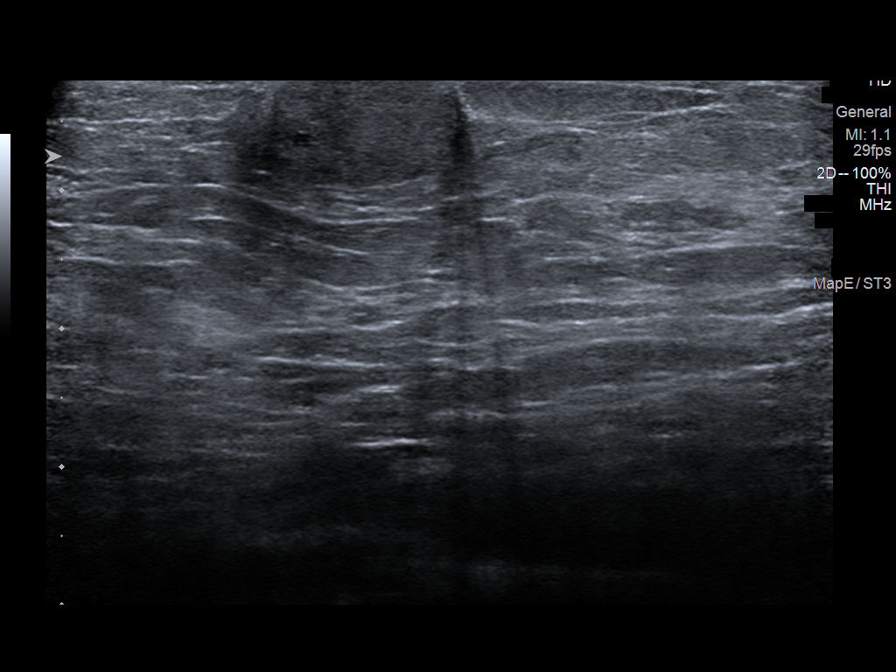
[im 5/8]
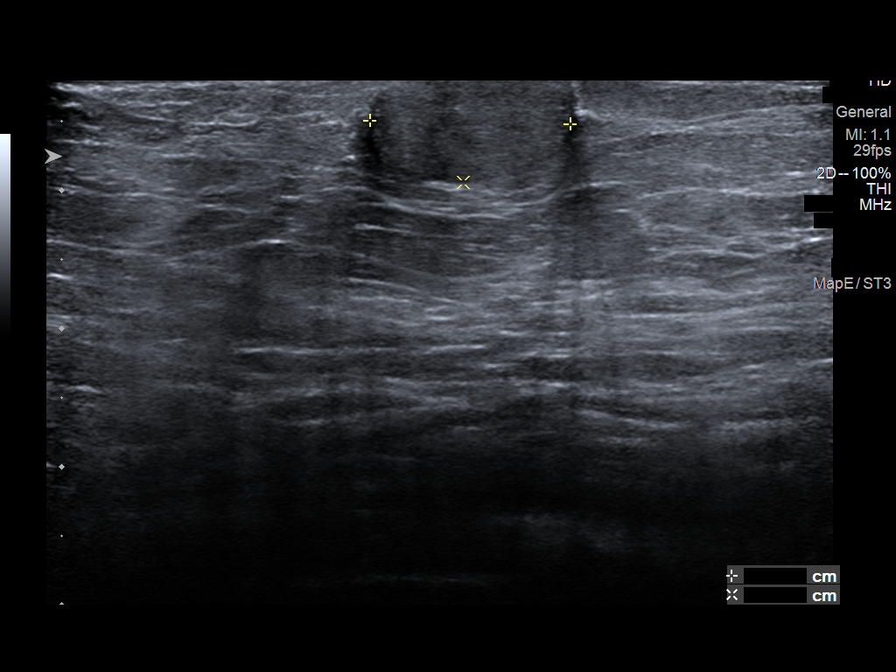
[im 6/8]
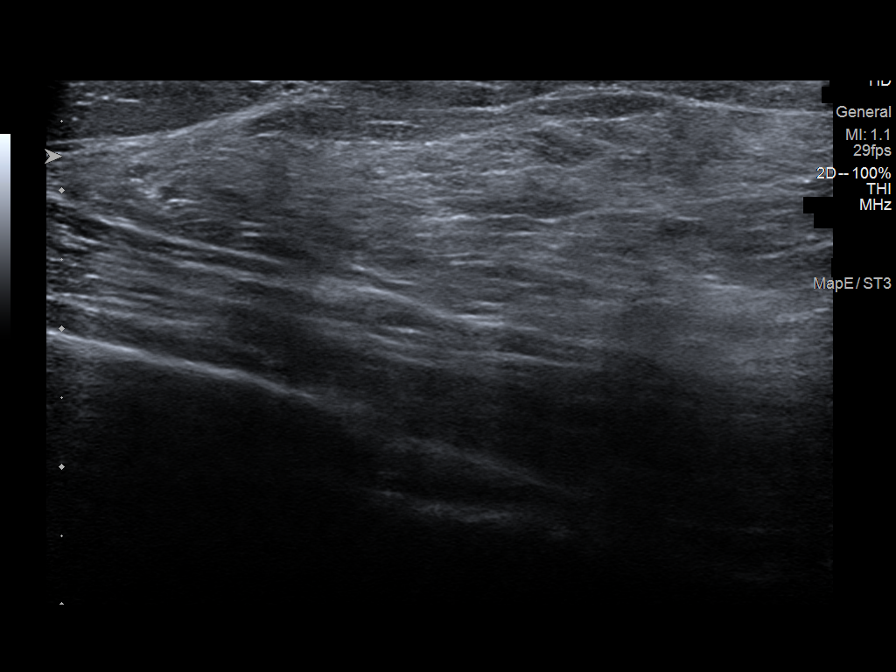
[im 7/8]
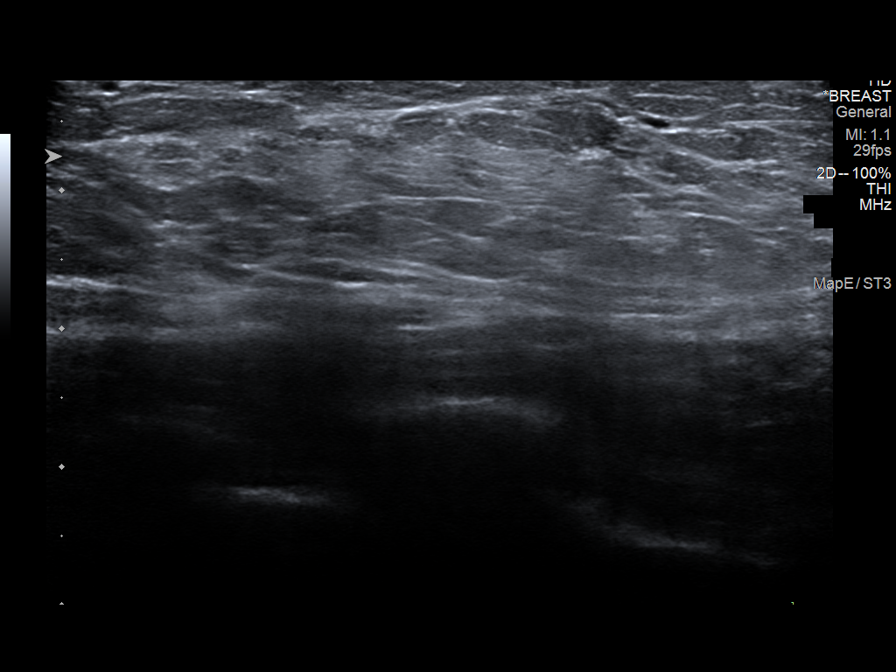
[im 8/8]
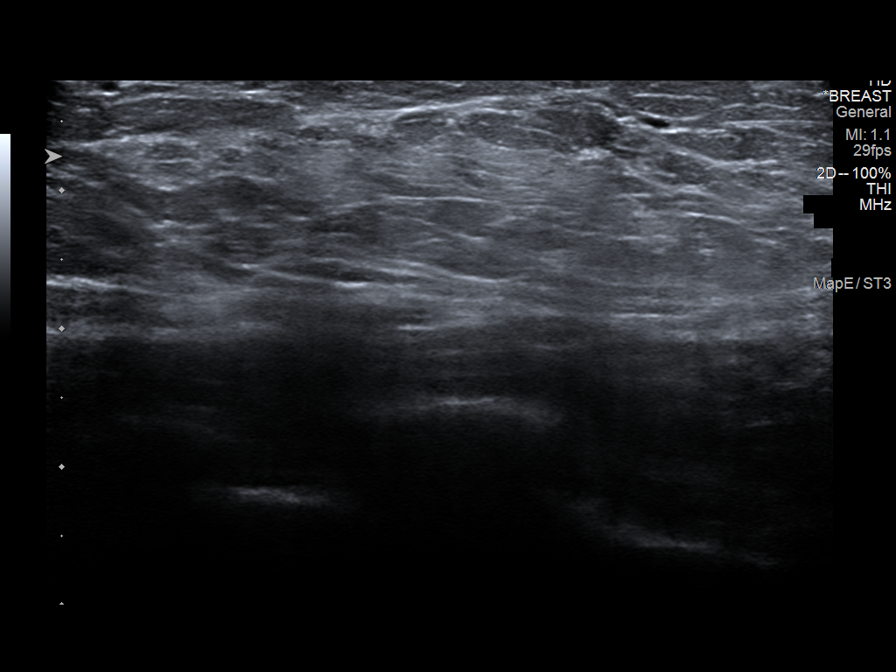

[8 of 8 positions shown; findings below may reference images not displayed]

FINDINGS: Targeted ultrasound is performed, showing an oval circumscribed
hypoechoic-to-isoechoic mass in the RIGHT breast at the 10 o'clock
axis, 9 cm from the nipple, measuring 2.2 x 0.8 x 1.5 cm,
corresponding to the palpable area of concern, most suggestive of a
benign fibroadenoma.
IMPRESSION: Probably benign fibroadenoma in the RIGHT breast at the 10 o'clock
axis, 9 cm from the nipple, measuring 2.2 x 0.8 x 1.5 cm,
corresponding to the palpable area of concern. Recommend follow-up
RIGHT breast ultrasound in 6 months to ensure stability.

RECOMMENDATION:
RIGHT breast ultrasound in 6 months.

I have discussed the findings and recommendations with the patient.
If applicable, a reminder letter will be sent to the patient
regarding the next appointment.

BI-RADS CATEGORY  3: Probably benign.

## 2022-12-20 ENCOUNTER — Ambulatory Visit
Admission: EM | Admit: 2022-12-20 | Discharge: 2022-12-20 | Disposition: A | Discharge status: Home / Self Care | Payer: Medicaid Other | Attending: Urgent Care | Admitting: Urgent Care

## 2022-12-20 DIAGNOSIS — N912 Amenorrhea, unspecified: Secondary | ICD-10-CM | POA: Diagnosis not present

## 2022-12-20 LAB — POCT URINALYSIS DIP (MANUAL ENTRY)
Bilirubin, UA: NEGATIVE
Glucose, UA: NEGATIVE mg/dL
Ketones, POC UA: NEGATIVE mg/dL
Nitrite, UA: NEGATIVE
Protein Ur, POC: NEGATIVE mg/dL
Spec Grav, UA: 1.02 (ref 1.010–1.025)
Urobilinogen, UA: 1 E.U./dL
pH, UA: 6.5 (ref 5.0–8.0)

## 2022-12-20 LAB — POCT URINE PREGNANCY: Preg Test, Ur: NEGATIVE

## 2022-12-20 NOTE — ED Provider Notes (Signed)
[facility]  Note:  This document was prepared using Conservation officer, historic buildings and may include unintentional dictation errors.  MRN: [id number] DOB: Nov 17, 1995  Subjective:   Rhonda Evans is a 27 y.o. female presenting for amenorrhea for the past 2 months.  Denies fever, n/v, abdominal pain, pelvic pain, rashes, dysuria, urinary frequency, hematuria, vaginal discharge.  She also had an STI check recently, has no concern for STI.  Just wants to make sure she is not pregnant.  She did have a LEEP procedure about a year ago.  Was negative for cancer.  Has not followed up.  No current facility-administered medications for this encounter.  Current Outpatient Medications:    fluconazole (DIFLUCAN) 100 MG tablet, Take 2 tablets on day one and then one tablet daily for 13 days. (Patient not taking: Reported on 01/18/2022), Disp: 15 tablet, Rfl: 0   Prenatal 27-1 MG TABS, Take 1 tablet by mouth daily. (Patient not taking: Reported on 09/25/2021), Disp: 30 tablet, Rfl: 12   Allergies  Allergen Reactions   Amoxicillin Anaphylaxis and Rash    Has patient had a PCN reaction causing immediate rash, facial/tongue/throat swelling, SOB or lightheadedness with hypotension: Yes Has patient had a PCN reaction causing severe rash involving mucus membranes or skin necrosis: No Has patient had a PCN reaction that required hospitalization Yes Has patient had a PCN reaction occurring within the last 10 years: No If all of the above answers are "NO", then may proceed with Cephalosporin use.    Latex Rash    Past Medical History:  Diagnosis Date   Anxiety    Depression    Medical history non-contributory    Vaginal Pap smear, abnormal      Past Surgical History:  Procedure Laterality Date   CERVICAL BIOPSY     NO PAST SURGERIES      Family History  Problem Relation Age of Onset   Ovarian cancer Mother    Ovarian cancer Sister    Hypertension Maternal Aunt     Diabetes Maternal Grandmother        great   Hypertension Maternal Grandmother        great   Diabetes Maternal Grandfather    Hypertension Maternal Grandfather    Cancer Maternal Grandfather    Cancer Paternal Grandfather    Hearing loss Neg Hx     Social History   Tobacco Use   Smoking status: Never   Smokeless tobacco: Never  Vaping Use   Vaping Use: Every day  Substance Use Topics   Alcohol use: Not Currently   Drug use: Not Currently    Types: Marijuana    ROS   Objective:   Vitals: BP 108/72 (BP Location: Right Arm)   Pulse 98   Temp 98.6 F (37 C) (Oral)   Resp 16   LMP 10/16/2022   SpO2 97%   Breastfeeding No   Physical Exam Constitutional:      General: She is not in acute distress.    Appearance: Normal appearance. She is well-developed. She is not ill-appearing, toxic-appearing or diaphoretic.  HENT:     Head: Normocephalic and atraumatic.     Nose: Nose normal.     Mouth/Throat:     Mouth: Mucous membranes are moist.  Eyes:     General: No scleral icterus.       Right eye: No discharge.        Left eye: No discharge.     Extraocular  Movements: Extraocular movements intact.  Cardiovascular:     Rate and Rhythm: Normal rate.  Pulmonary:     Effort: Pulmonary effort is normal.  Skin:    General: Skin is warm and dry.  Neurological:     General: No focal deficit present.     Mental Status: She is alert and oriented to person, place, and time.  Psychiatric:        Mood and Affect: Mood normal.        Behavior: Behavior normal.     Results for orders placed or performed during the hospital encounter of 12/20/22 (from the past 24 hour(s))  POCT urinalysis dipstick     Status: Abnormal   Collection Time: 12/20/22  1:41 PM  Result Value Ref Range   Color, UA yellow yellow   Clarity, UA cloudy (A) clear   Glucose, UA negative negative mg/dL   Bilirubin, UA negative negative   Ketones, POC UA negative negative mg/dL   Spec Grav, UA 4.098  1.010 - 1.025   Blood, UA trace-intact (A) negative   pH, UA 6.5 5.0 - 8.0   Protein Ur, POC negative negative mg/dL   Urobilinogen, UA 1.0 0.2 or 1.0 E.U./dL   Nitrite, UA Negative Negative   Leukocytes, UA Trace (A) Negative  POCT urine pregnancy     Status: None   Collection Time: 12/20/22  1:41 PM  Result Value Ref Range   Preg Test, Ur Negative Negative    Assessment and Plan :   PDMP not reviewed this encounter.  1. Amenorrhea    Pregnancy test negative.  Discussed sources of amenorrhea.  Recommended close follow-up with her gynecologist.  Patient declined STI testing.   Wallis Bamberg, [place] 12/20/22 1423

## 2022-12-20 NOTE — ED Triage Notes (Signed)
Pt requesting preg test-LMP 4/23 -NAD-steady gait

## 2023-02-13 ENCOUNTER — Ambulatory Visit
Admission: EM | Admit: 2023-02-13 | Discharge: 2023-02-13 | Disposition: A | Discharge status: Home / Self Care | Payer: Medicaid Other | Attending: Internal Medicine | Admitting: Internal Medicine

## 2023-02-13 DIAGNOSIS — Z113 Encounter for screening for infections with a predominantly sexual mode of transmission: Secondary | ICD-10-CM | POA: Diagnosis not present

## 2023-02-13 DIAGNOSIS — N3001 Acute cystitis with hematuria: Secondary | ICD-10-CM | POA: Insufficient documentation

## 2023-02-13 LAB — POCT URINALYSIS DIP (MANUAL ENTRY)
Bilirubin, UA: NEGATIVE
Glucose, UA: NEGATIVE mg/dL
Ketones, POC UA: NEGATIVE mg/dL
Nitrite, UA: POSITIVE — AB
Protein Ur, POC: 30 mg/dL — AB
Spec Grav, UA: 1.025 (ref 1.010–1.025)
Urobilinogen, UA: 0.2 E.U./dL
pH, UA: 7.5 (ref 5.0–8.0)

## 2023-02-13 LAB — POCT URINE PREGNANCY: Preg Test, Ur: NEGATIVE

## 2023-02-13 MED ORDER — NITROFURANTOIN MONOHYD MACRO 100 MG PO CAPS: 100.0000 mg | ORAL_CAPSULE | Freq: Two times a day (BID) | ORAL | 0 refills | Status: DC

## 2023-02-13 NOTE — ED Provider Notes (Signed)
[facility]  Note:  This document was prepared using Conservation officer, historic buildings and may include unintentional dictation errors.  MRN: [id number] DOB: 1996/03/11  Subjective:   Rhonda Evans is a 27 y.o. female presenting for 1 week history of acute onset dysuria, urinary frequency, strong malodorous urine. Has 1 female partner, has unprotected sex. Denies fever, n/v, abdominal pain, pelvic pain, rashes, hematuria, vaginal discharge.    No current facility-administered medications for this encounter.  Current Outpatient Medications:    fluconazole (DIFLUCAN) 100 MG tablet, Take 2 tablets on day one and then one tablet daily for 13 days. (Patient not taking: Reported on 01/18/2022), Disp: 15 tablet, Rfl: 0   Prenatal 27-1 MG TABS, Take 1 tablet by mouth daily. (Patient not taking: Reported on 09/25/2021), Disp: 30 tablet, Rfl: 12   Allergies  Allergen Reactions   Amoxicillin Anaphylaxis and Rash    Has patient had a PCN reaction causing immediate rash, facial/tongue/throat swelling, SOB or lightheadedness with hypotension: Yes Has patient had a PCN reaction causing severe rash involving mucus membranes or skin necrosis: No Has patient had a PCN reaction that required hospitalization Yes Has patient had a PCN reaction occurring within the last 10 years: No If all of the above answers are "NO", then may proceed with Cephalosporin use.    Latex Rash    Past Medical History:  Diagnosis Date   Anxiety    Depression    Medical history non-contributory    Vaginal Pap smear, abnormal      Past Surgical History:  Procedure Laterality Date   CERVICAL BIOPSY     NO PAST SURGERIES      Family History  Problem Relation Age of Onset   Ovarian cancer Mother    Ovarian cancer Sister    Hypertension Maternal Aunt    Diabetes Maternal Grandmother        great   Hypertension Maternal Grandmother        great   Diabetes Maternal Grandfather    Hypertension  Maternal Grandfather    Cancer Maternal Grandfather    Cancer Paternal Grandfather    Hearing loss Neg Hx     Social History   Tobacco Use   Smoking status: Never   Smokeless tobacco: Never  Vaping Use   Vaping status: Every Day  Substance Use Topics   Alcohol use: Not Currently   Drug use: Not Currently    Types: Marijuana    ROS   Objective:   Vitals: BP 107/76 (BP Location: Right Arm)   Pulse 93   Temp 98.8 F (37.1 C) (Oral)   Resp 16   SpO2 98%   Breastfeeding No   Physical Exam Constitutional:      General: She is not in acute distress.    Appearance: Normal appearance. She is well-developed. She is not ill-appearing, toxic-appearing or diaphoretic.  HENT:     Head: Normocephalic and atraumatic.     Nose: Nose normal.     Mouth/Throat:     Mouth: Mucous membranes are moist.  Eyes:     General: No scleral icterus.       Right eye: No discharge.        Left eye: No discharge.     Extraocular Movements: Extraocular movements intact.     Conjunctiva/sclera: Conjunctivae normal.  Cardiovascular:     Rate and Rhythm: Normal rate.  Pulmonary:     Effort: Pulmonary effort is normal.  Abdominal:  General: Bowel sounds are normal. There is no distension.     Palpations: Abdomen is soft. There is no mass.     Tenderness: There is no abdominal tenderness. There is no right CVA tenderness, left CVA tenderness, guarding or rebound.  Skin:    General: Skin is warm and dry.  Neurological:     General: No focal deficit present.     Mental Status: She is alert and oriented to person, place, and time.  Psychiatric:        Mood and Affect: Mood normal.        Behavior: Behavior normal.        Thought Content: Thought content normal.        Judgment: Judgment normal.     Results for orders placed or performed during the hospital encounter of 02/13/23 (from the past 24 hour(s))  POCT urinalysis dipstick     Status: Abnormal   Collection Time: 02/13/23  6:59  PM  Result Value Ref Range   Color, UA yellow yellow   Clarity, UA cloudy (A) clear   Glucose, UA negative negative mg/dL   Bilirubin, UA negative negative   Ketones, POC UA negative negative mg/dL   Spec Grav, UA 9.811 9.147 - 1.025   Blood, UA moderate (A) negative   pH, UA 7.5 5.0 - 8.0   Protein Ur, POC =30 (A) negative mg/dL   Urobilinogen, UA 0.2 0.2 or 1.0 E.U./dL   Nitrite, UA Positive (A) Negative   Leukocytes, UA Small (1+) (A) Negative  POCT urine pregnancy     Status: None   Collection Time: 02/13/23  6:59 PM  Result Value Ref Range   Preg Test, Ur Negative Negative    Assessment and Plan :   PDMP not reviewed this encounter.  1. Acute cystitis with hematuria   2. Screen for STD (sexually transmitted disease)    Start Macrobid to cover for acute cystitis, urine culture and vaginal cytology pending.  Recommended aggressive hydration, limiting urinary irritants. Counseled patient on potential for adverse effects with medications prescribed/recommended today, ER and return-to-clinic precautions discussed, patient verbalized understanding.    Wallis Bamberg, [place] 02/13/23 8295

## 2023-02-13 NOTE — Discharge Instructions (Addendum)
Please start nitrofurantion to address an urinary tract infection. Make sure you hydrate very well with plain water and a quantity of 80 ounces of water a day.  Please limit drinks that are considered urinary irritants such as soda, sweet tea, coffee, energy drinks, alcohol.  These can worsen your urinary and genital symptoms but also be the source of them.  I will let you know about your vaginal swab and urine culture results through MyChart to see if we need to prescribe or change your antibiotics based off of those results.

## 2023-02-13 NOTE — ED Triage Notes (Signed)
Pt reports discomfort when urinating. Pt requested STD's and urine test.

## 2023-02-14 ENCOUNTER — Telehealth: Payer: Self-pay

## 2023-02-14 LAB — CERVICOVAGINAL ANCILLARY ONLY
Bacterial Vaginitis (gardnerella): NEGATIVE
Candida Glabrata: NEGATIVE
Candida Vaginitis: POSITIVE — AB
Chlamydia: NEGATIVE
Comment: NEGATIVE
Comment: NEGATIVE
Comment: NEGATIVE
Comment: NEGATIVE
Comment: NEGATIVE
Comment: NORMAL
Neisseria Gonorrhea: NEGATIVE
Trichomonas: NEGATIVE

## 2023-02-14 MED ORDER — FLUCONAZOLE 150 MG PO TABS: 150.0000 mg | ORAL_TABLET | Freq: Once | ORAL | 0 refills | Status: AC

## 2023-02-14 NOTE — Telephone Encounter (Signed)
 Per protocol, pt requires tx with Diflucan. Reviewed with patient, verified pharmacy, prescription sent.

## 2023-02-15 LAB — URINE CULTURE: Culture: >=100000 — AB

## 2023-04-07 ENCOUNTER — Encounter (HOSPITAL_BASED_OUTPATIENT_CLINIC_OR_DEPARTMENT_OTHER): Payer: Self-pay

## 2023-04-07 ENCOUNTER — Other Ambulatory Visit: Payer: Self-pay

## 2023-04-07 ENCOUNTER — Emergency Department (HOSPITAL_BASED_OUTPATIENT_CLINIC_OR_DEPARTMENT_OTHER)
Admission: EM | Admit: 2023-04-07 | Discharge: 2023-04-07 | Disposition: A | Discharge status: Home / Self Care | Payer: Medicaid Other | Attending: Emergency Medicine | Admitting: Emergency Medicine

## 2023-04-07 DIAGNOSIS — Z91040 Latex allergy status: Secondary | ICD-10-CM | POA: Diagnosis not present

## 2023-04-07 DIAGNOSIS — O23591 Infection of other part of genital tract in pregnancy, first trimester: Secondary | ICD-10-CM | POA: Diagnosis present

## 2023-04-07 DIAGNOSIS — N898 Other specified noninflammatory disorders of vagina: Secondary | ICD-10-CM

## 2023-04-07 DIAGNOSIS — Z3A01 Less than 8 weeks gestation of pregnancy: Secondary | ICD-10-CM | POA: Diagnosis not present

## 2023-04-07 LAB — WET PREP, GENITAL
Clue Cells Wet Prep HPF POC: NONE SEEN
Sperm: NONE SEEN
Trich, Wet Prep: NONE SEEN
WBC, Wet Prep HPF POC: <10 (ref <10)
Yeast Wet Prep HPF POC: NONE SEEN

## 2023-04-07 LAB — URINALYSIS, ROUTINE W REFLEX MICROSCOPIC
Bilirubin Urine: NEGATIVE
Glucose, UA: NEGATIVE mg/dL
Ketones, ur: 15 mg/dL — AB
Leukocytes,Ua: NEGATIVE
Nitrite: NEGATIVE
Protein, ur: NEGATIVE mg/dL
Specific Gravity, Urine: >=1.03 (ref 1.005–1.030)
pH: 6.5 (ref 5.0–8.0)

## 2023-04-07 LAB — PREGNANCY, URINE: Preg Test, Ur: POSITIVE — AB

## 2023-04-07 LAB — URINALYSIS, MICROSCOPIC (REFLEX)

## 2023-04-07 MED ORDER — AZITHROMYCIN 250 MG PO TABS
1000.0000 mg | ORAL_TABLET | Freq: Once | ORAL | Status: DC
  Filled 2023-04-07: qty 4

## 2023-04-07 MED ORDER — ONDANSETRON 4 MG PO TBDP
4.0000 mg | ORAL_TABLET | Freq: Once | ORAL | Status: DC
  Filled 2023-04-07: qty 1

## 2023-04-07 MED ORDER — CEFTRIAXONE SODIUM 500 MG IJ SOLR
500.0000 mg | Freq: Once | INTRAMUSCULAR | Status: DC
  Filled 2023-04-07: qty 500

## 2023-04-07 MED ORDER — ONDANSETRON 4 MG PO TBDP: 4.0000 mg | ORAL_TABLET | Freq: Three times a day (TID) | ORAL | 0 refills | Status: DC | PRN

## 2023-04-07 MED ORDER — LIDOCAINE HCL (PF) 1 % IJ SOLN
2.0000 mL | Freq: Once | INTRAMUSCULAR | Status: DC
  Filled 2023-04-07: qty 5

## 2023-04-07 NOTE — ED Triage Notes (Signed)
Patient reports feeling nauseous for a week. Patient also reports she had a yeast infection in August and being treated it for it. Today patient reports vaginal odor and change in her discharge. No burning or itching. Patient is also concerned about being pregnant. Patient has not taken a home pregnancy test. States her LMP was 03/02/2023

## 2023-04-07 NOTE — ED Provider Notes (Signed)
[facility]  Provider Note  CSN: [id number] Arrival date & time: 04/07/23 0000  History Chief Complaint  Patient presents with   Vaginal Discharge    Rhonda Evans is a 27 y.o. female presents for evaluation of vaginal odor and missed period. She also reports nausea and general malaise. She has had two prior pregnancies. Reports she was treated for yeast infection 2 months ago as well.    Home Medications Prior to Admission medications   Medication Sig Start Date End Date Taking? Authorizing Provider  ondansetron (ZOFRAN-ODT) 4 MG disintegrating tablet Take 1 tablet (4 mg total) by mouth every 8 (eight) hours as needed for nausea or vomiting. 04/07/23  Yes Pollyann Savoy, MD  nitrofurantoin, macrocrystal-monohydrate, (MACROBID) 100 MG capsule Take 1 capsule (100 mg total) by mouth 2 (two) times daily. 02/13/23   Wallis Bamberg, PA-C  escitalopram (LEXAPRO) 20 MG tablet Take 1 tablet (20 mg total) by mouth daily. Patient not taking: Reported on 06/01/2019 12/04/18 10/27/20  Arfeen, Phillips Grout, MD  norethindrone-ethinyl estradiol (NECON 0.5/35, 28,) 0.5-35 MG-MCG tablet Take 1 tablet by mouth daily. Patient not taking: Reported on 06/01/2019 10/08/18 10/27/20  Gerrit Heck, CNM     Allergies    Amoxicillin and Latex   Review of Systems   Review of Systems Please see HPI for pertinent positives and negatives  Physical Exam BP (!) 126/91 (BP Location: Right Arm)   Pulse (!) 104   Temp 98.3 F (36.8 C)   Resp 20   Wt 72.6 kg   LMP 03/02/2023 (Approximate)   SpO2 100%   Breastfeeding No   BMI 26.63 kg/m   Physical Exam Vitals and nursing note reviewed.  HENT:     Head: Normocephalic.     Nose: Nose normal.  Eyes:     Extraocular Movements: Extraocular movements intact.  Pulmonary:     Effort: Pulmonary effort is normal.  Musculoskeletal:        General: Normal range of motion.     Cervical back: Neck supple.  Skin:     Findings: No rash (on exposed skin).  Neurological:     Mental Status: She is alert and oriented to person, place, and time.  Psychiatric:        Mood and Affect: Mood normal.     ED Results / Procedures / Treatments   EKG None  Procedures Procedures  Medications Ordered in the ED Medications  ondansetron (ZOFRAN-ODT) disintegrating tablet 4 mg (4 mg Oral Not Given 04/07/23 0115)  azithromycin (ZITHROMAX) tablet 1,000 mg (has no administration in time range)  cefTRIAXone (ROCEPHIN) injection 500 mg (has no administration in time range)    Initial Impression and Plan  Patient here for vaginal odor and missed period. She has a positive pregnancy test. LMP was about 5 weeks ago but was brief so could be anywhere from 5 to 9 weeks. She is not having any bleeding or cramping. Also having some vaginal odor and a change in discharge. Recent yeast infection was treated. Wet prep today is negative. Recommend empiric treatment for GC/CT pending swab result. Plan zofran for nausea. Ob/Gyn follow up for pregnancy.   ED Course   Clinical Course as of 04/07/23 0125  Wynelle Link Apr 07, 2023  0123 Patient has a documented PCN allergy but has tolerated ceftriaxone in the past. Will give that plus zithromax for her cervicitis.  [CS]    Clinical Course User Index [CS] Pollyann Savoy,  MD     MDM Rules/Calculators/A&P Medical Decision Making Problems Addressed: Less than [redacted] weeks gestation of pregnancy: undiagnosed new problem with uncertain prognosis Vaginal discharge: acute illness or injury  Amount and/or Complexity of Data Reviewed Labs: ordered. Decision-making details documented in ED Course.  Risk Prescription drug management.     Final Clinical Impression(s) / ED Diagnoses Final diagnoses:  Vaginal discharge  Less than [redacted] weeks gestation of pregnancy    Rx / DC Orders ED Discharge Orders          Ordered    ondansetron (ZOFRAN-ODT) 4 MG disintegrating tablet  Every 8 hours  PRN        04/07/23 0125             Pollyann Savoy, MD 04/07/23 [phone]

## 2023-04-07 NOTE — ED Notes (Signed)
Patient is self swabbing.

## 2023-04-08 LAB — GC/CHLAMYDIA PROBE AMP (~~LOC~~) NOT AT ARMC
Chlamydia: NEGATIVE
Comment: NEGATIVE
Comment: NORMAL
Neisseria Gonorrhea: NEGATIVE

## 2023-04-17 ENCOUNTER — Ambulatory Visit
Admission: EM | Admit: 2023-04-17 | Discharge: 2023-04-17 | Disposition: A | Discharge status: Other Acute Inpatient Hospital | Payer: Medicaid Other | Attending: Internal Medicine | Admitting: Internal Medicine

## 2023-04-17 ENCOUNTER — Inpatient Hospital Stay (HOSPITAL_COMMUNITY)
Admission: AD | Admit: 2023-04-17 | Discharge: 2023-04-18 | Disposition: A | Discharge status: Home / Self Care | Payer: Medicaid Other | Attending: Obstetrics & Gynecology | Admitting: Obstetrics & Gynecology

## 2023-04-17 ENCOUNTER — Encounter (HOSPITAL_COMMUNITY): Payer: Self-pay

## 2023-04-17 ENCOUNTER — Inpatient Hospital Stay (HOSPITAL_COMMUNITY): Payer: Medicaid Other

## 2023-04-17 DIAGNOSIS — Z3A01 Less than 8 weeks gestation of pregnancy: Secondary | ICD-10-CM | POA: Insufficient documentation

## 2023-04-17 DIAGNOSIS — O4690 Antepartum hemorrhage, unspecified, unspecified trimester: Secondary | ICD-10-CM | POA: Diagnosis not present

## 2023-04-17 DIAGNOSIS — R102 Pelvic and perineal pain: Secondary | ICD-10-CM | POA: Insufficient documentation

## 2023-04-17 DIAGNOSIS — R109 Unspecified abdominal pain: Secondary | ICD-10-CM | POA: Diagnosis not present

## 2023-04-17 DIAGNOSIS — O3680X0 Pregnancy with inconclusive fetal viability, not applicable or unspecified: Secondary | ICD-10-CM | POA: Insufficient documentation

## 2023-04-17 DIAGNOSIS — O26891 Other specified pregnancy related conditions, first trimester: Secondary | ICD-10-CM | POA: Insufficient documentation

## 2023-04-17 DIAGNOSIS — O26899 Other specified pregnancy related conditions, unspecified trimester: Secondary | ICD-10-CM

## 2023-04-17 DIAGNOSIS — O26851 Spotting complicating pregnancy, first trimester: Secondary | ICD-10-CM | POA: Diagnosis not present

## 2023-04-17 LAB — CBC
HCT: 35.4 % — ABNORMAL LOW (ref 36.0–46.0)
Hemoglobin: 12.2 g/dL (ref 12.0–15.0)
MCH: 26.5 pg (ref 26.0–34.0)
MCHC: 34.5 g/dL (ref 30.0–36.0)
MCV: 76.8 fL — ABNORMAL LOW (ref 80.0–100.0)
Platelets: 424 10*3/uL — ABNORMAL HIGH (ref 150–400)
RBC: 4.61 MIL/uL (ref 3.87–5.11)
RDW: 14.9 % (ref 11.5–15.5)
WBC: 6.3 10*3/uL (ref 4.0–10.5)
nRBC: 0 % (ref 0.0–0.2)

## 2023-04-17 LAB — URINALYSIS, ROUTINE W REFLEX MICROSCOPIC
Bilirubin Urine: NEGATIVE
Glucose, UA: NEGATIVE mg/dL
Ketones, ur: NEGATIVE mg/dL
Leukocytes,Ua: NEGATIVE
Nitrite: NEGATIVE
Protein, ur: NEGATIVE mg/dL
Specific Gravity, Urine: 1.015 (ref 1.005–1.030)
pH: 5 (ref 5.0–8.0)

## 2023-04-17 LAB — COMPREHENSIVE METABOLIC PANEL
ALT: 11 U/L (ref 0–44)
AST: 19 U/L (ref 15–41)
Albumin: 3.7 g/dL (ref 3.5–5.0)
Alkaline Phosphatase: 50 U/L (ref 38–126)
Anion gap: 10 (ref 5–15)
BUN: 6 mg/dL (ref 6–20)
CO2: 24 mmol/L (ref 22–32)
Calcium: 9.7 mg/dL (ref 8.9–10.3)
Chloride: 105 mmol/L (ref 98–111)
Creatinine, Ser: 0.56 mg/dL (ref 0.44–1.00)
GFR, Estimated: >60 mL/min (ref >60)
Glucose, Bld: 101 mg/dL — ABNORMAL HIGH (ref 70–99)
Potassium: 3.5 mmol/L (ref 3.5–5.1)
Sodium: 139 mmol/L (ref 135–145)
Total Bilirubin: 0.2 mg/dL — ABNORMAL LOW (ref 0.3–1.2)
Total Protein: 7.1 g/dL (ref 6.5–8.1)

## 2023-04-17 LAB — HCG, QUANTITATIVE, PREGNANCY: hCG, Beta Chain, Quant, S: 34354 m[IU]/mL — ABNORMAL HIGH (ref <5)

## 2023-04-17 NOTE — MAU Provider Note (Signed)
Chief Complaint: Abdominal Pain   Event Date/Time   First Provider Initiated Contact with Patient 04/17/23 2101        SUBJECTIVE HPI: Rhonda Evans is a 27 y.o. G3P2002 at Unknown by LMP who presents to maternity admissions reporting pelvic cramping since Monday.  Had spotting two days but none now.  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  . She denies urinary symptoms, h/a, dizziness, n/v, or fever/chills.    Abdominal Pain This is a new problem. The current episode started in the past 7 days. The quality of the pain is cramping. The abdominal pain does not radiate. Pertinent negatives include no constipation, diarrhea or dysuria. Exacerbated by: lifting. The pain is relieved by Nothing. She has tried nothing for the symptoms.   RN Note: .Rhonda Evans is a 27 y.o. at Unknown here in MAU reporting: was seen at  Is having abdominal cramping and vaginal bleeding that began on Monday.  Cramping (6/10)  is in her lower back and lower abdomen. Has not taken anything for the cramping.  Vaginal bleeding on Monday & Tuesday only when she wiped, has not had vaginal bleeding today.  Her occupation she needs to lift heavy things, is concerned that this caused this.  LMP: Early September Pain score: 6/10  Past Medical History:  Diagnosis Date   Anxiety    Depression    Medical history non-contributory    Vaginal Pap smear, abnormal    Past Surgical History:  Procedure Laterality Date   CERVICAL BIOPSY     NO PAST SURGERIES     Social History   Socioeconomic History   Marital status: Single    Spouse name: Kemario   Number of children: 1   Years of education: Not on file   Highest education level: Not on file  Occupational History   Not on file  Tobacco Use   Smoking status: Never   Smokeless tobacco: Never  Vaping Use   Vaping status: Every Day  Substance and Sexual Activity   Alcohol use: Not Currently   Drug use: Not Currently    Types: Marijuana   Sexual  activity: Yes    Partners: Male    Birth control/protection: None  Other Topics Concern   Not on file  Social History Narrative   Not on file   Social Determinants of Health   Financial Resource Strain: Not on file  Food Insecurity: Food Insecurity Present (03/29/2021)   Hunger Vital Sign    Worried About Running Out of Food in the Last Year: Never true    Ran Out of Food in the Last Year: Sometimes true  Transportation Needs: Unmet Transportation Needs (03/29/2021)   PRAPARE - Administrator, Civil Service (Medical): Yes    Lack of Transportation (Non-Medical): Yes  Physical Activity: Not on file  Stress: Not on file  Social Connections: Unknown (04/12/2023)   Received from [facility]   Social Network    Social Network: Not on file  Intimate Partner Violence: Unknown (04/12/2023)   Received from [facility]   HITS    Physically Hurt: Not on file    Insult or Talk Down To: Not on file    Threaten Physical Harm: Not on file    Scream or Curse: Not on file   No current facility-administered medications on file prior to encounter.   Current Outpatient Medications on File Prior to Encounter  Medication Sig Dispense Refill   nitrofurantoin, macrocrystal-monohydrate, (MACROBID) 100  MG capsule Take 1 capsule (100 mg total) by mouth 2 (two) times daily. 10 capsule 0   ondansetron (ZOFRAN-ODT) 4 MG disintegrating tablet Take 1 tablet (4 mg total) by mouth every 8 (eight) hours as needed for nausea or vomiting. 20 tablet 0   [DISCONTINUED] escitalopram (LEXAPRO) 20 MG tablet Take 1 tablet (20 mg total) by mouth daily. (Patient not taking: Reported on 06/01/2019) 30 tablet 1   [DISCONTINUED] norethindrone-ethinyl estradiol (NECON 0.5/35, 28,) 0.5-35 MG-MCG tablet Take 1 tablet by mouth daily. (Patient not taking: Reported on 06/01/2019) 1 Package 11   Allergies  Allergen Reactions   Amoxicillin Anaphylaxis and Rash    Has patient had a PCN reaction causing immediate  rash, facial/tongue/throat swelling, SOB or lightheadedness with hypotension: Yes Has patient had a PCN reaction causing severe rash involving mucus membranes or skin necrosis: No Has patient had a PCN reaction that required hospitalization Yes Has patient had a PCN reaction occurring within the last 10 years: No If all of the above answers are "NO", then may proceed with Cephalosporin use.    Latex Rash    I have reviewed patient's Past Medical Hx, Surgical Hx, Family Hx, Social Hx, medications and allergies.   ROS:  Review of Systems  Gastrointestinal:  Positive for abdominal pain. Negative for constipation and diarrhea.  Genitourinary:  Negative for dysuria.   Review of Systems  Other systems negative   Physical Exam  Physical Exam Patient Vitals for the past 24 hrs:  BP Temp Temp src Pulse Resp SpO2 Height Weight  04/17/23 2022 118/72 98.1 F (36.7 C) Oral 77 16 100 % 5\' 6"  (1.676 m) 76.6 kg   Constitutional: Well-developed, well-nourished female in no acute distress.  Cardiovascular: normal rate Respiratory: normal effort GI: Abd soft, non-tender.  MS: Extremities nontender, no edema, normal ROM Neurologic: Alert and oriented x 4.  GU: Neg CVAT.  PELVIC EXAM: deferred in lieu of [place]  LAB RESULTS Results for orders placed or performed during the hospital encounter of 04/17/23 (from the past 24 hour(s))  Urinalysis, Routine w reflex microscopic -Urine, Clean Catch     Status: Abnormal   Collection Time: 04/17/23  8:34 PM  Result Value Ref Range   Color, Urine YELLOW YELLOW   APPearance HAZY (A) CLEAR   Specific Gravity, Urine 1.015 1.005 - 1.030   pH 5.0 5.0 - 8.0   Glucose, UA NEGATIVE NEGATIVE mg/dL   Hgb urine dipstick SMALL (A) NEGATIVE   Bilirubin Urine NEGATIVE NEGATIVE   Ketones, ur NEGATIVE NEGATIVE mg/dL   Protein, ur NEGATIVE NEGATIVE mg/dL   Nitrite NEGATIVE NEGATIVE   Leukocytes,Ua NEGATIVE NEGATIVE   RBC / HPF 0-5 0 - 5 RBC/hpf   WBC, UA 6-10 0 - 5  WBC/hpf   Bacteria, UA MANY (A) NONE SEEN   Squamous Epithelial / HPF 6-10 0 - 5 /HPF   Mucus PRESENT   CBC     Status: Abnormal   Collection Time: 04/17/23  9:18 PM  Result Value Ref Range   WBC 6.3 4.0 - 10.5 K/uL   RBC 4.61 3.87 - 5.11 MIL/uL   Hemoglobin 12.2 12.0 - 15.0 g/dL   HCT 09.8 (L) 11.9 - 14.7 %   MCV 76.8 (L) 80.0 - 100.0 fL   MCH 26.5 26.0 - 34.0 pg   MCHC 34.5 30.0 - 36.0 g/dL   RDW 82.9 56.2 - 13.0 %   Platelets 424 (H) 150 - 400 K/uL   nRBC 0.0 0.0 - 0.2 %  Comprehensive metabolic panel     Status: Abnormal   Collection Time: 04/17/23  9:18 PM  Result Value Ref Range   Sodium 139 135 - 145 mmol/L   Potassium 3.5 3.5 - 5.1 mmol/L   Chloride 105 98 - 111 mmol/L   CO2 24 22 - 32 mmol/L   Glucose, Bld 101 (H) 70 - 99 mg/dL   BUN 6 6 - 20 mg/dL   Creatinine, Ser 9.60 0.44 - 1.00 mg/dL   Calcium 9.7 8.9 - 45.4 mg/dL   Total Protein 7.1 6.5 - 8.1 g/dL   Albumin 3.7 3.5 - 5.0 g/dL   AST 19 15 - 41 U/L   ALT 11 0 - 44 U/L   Alkaline Phosphatase 50 38 - 126 U/L   Total Bilirubin 0.2 (L) 0.3 - 1.2 mg/dL   GFR, Estimated >09 >81 mL/min   Anion gap 10 5 - 15  hCG, quantitative, pregnancy     Status: Abnormal   Collection Time: 04/17/23  9:21 PM  Result Value Ref Range   hCG, Beta Chain, Quant, S 34,354 (H) <5 mIU/mL      IMAGING US OB LESS THAN 14 WEEKS WITH OB TRANSVAGINAL  Result Date: 04/17/2023 CLINICAL DATA:  Cramping for 3 days. Quantitative beta HCG is pending. Positive urine pregnancy test. Uncertain LMP but estimated gestational age by LMP is 6 weeks 4 days. EXAM: OBSTETRIC <14 WK [place] AND TRANSVAGINAL OB US TECHNIQUE: Both transabdominal and transvaginal ultrasound examinations were performed for complete evaluation of the gestation as well as the maternal uterus, adnexal regions, and pelvic cul-de-sac. Transvaginal technique was performed to assess early pregnancy. COMPARISON:  None Available. FINDINGS: Intrauterine gestational sac: Single Yolk sac:   Visualized. Embryo:  Visualized. Cardiac Activity: Visualized. Heart Rate: 98 bpm CRL:  3.7 mm   6 w   0 d                  [place] EDC: 12/11/2023 Subchorionic hemorrhage:  None visualized. Maternal uterus/adnexae: Uterus is anteverted. No myometrial mass lesions identified. Both ovaries are visualized and appear normal. No abnormal adnexal masses. Minimal free fluid. IMPRESSION: Single intrauterine pregnancy. Estimated gestational age by crown-rump length is 6 weeks 0 days. No acute complication is suggested sonographically. Electronically Signed   By: Burman Nieves M.D.   On: 04/17/2023 23:25     MAU Management/MDM: I have reviewed the triage vital signs and the nursing notes.   Pertinent labs & imaging results that were available during my care of the patient were reviewed by me and considered in my medical decision making (see chart for details).      I have reviewed her medical records including past results, notes and treatments. Medical, Surgical, and family history were reviewed.  Medications and recent lab tests were reviewed  Ordered usual first trimester r/o ectopic labs.   Pelvic cultures done Will check baseline Ultrasound to rule out ectopic.  This bleeding/pain can represent a normal pregnancy with bleeding, spontaneous abortion or even an ectopic which can be life-threatening.  The process as listed above helps to determine which of these is present.  Reviewed results which are reassuring  ASSESSMENT Pregnancy uknown location, now deemed IUP Single live IUP at [redacted]w[redacted]d Spotting in early pregnancy  PLAN Discharge home Work restrictions note given Followup in office  Pt stable at time of discharge. Encouraged to return here if she develops worsening of symptoms, increase in pain, fever, or other concerning symptoms.    Wynelle Bourgeois  CNM, MSN Certified Nurse-Midwife 04/17/2023  9:01 PM

## 2023-04-17 NOTE — ED Provider Notes (Signed)
[facility]    CSN: [id number] Arrival date & time: 04/17/23  1919      History   Chief Complaint No chief complaint on file.   HPI Rhonda Evans is a 27 y.o. female presents for vaginal bleeding and cramping during pregnancy.  Patient is not sure how far along she is as far as pregnancy but states her last menstrual cycle was beginning of September.  She reports over the past 2 to 3 days she has been having lower abdominal cramping as well as low back pain.  She did have some vaginal bleeding on Monday as well.  Denies any current vaginal bleeding.  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 Authorizing Provider  nitrofurantoin, macrocrystal-monohydrate, (MACROBID) 100 MG capsule Take 1 capsule (100 mg total) by mouth 2 (two) times daily. 02/13/23   Wallis Bamberg, PA-C  ondansetron (ZOFRAN-ODT) 4 MG disintegrating tablet Take 1 tablet (4 mg total) by mouth every 8 (eight) hours as needed for nausea or vomiting. 04/07/23   Pollyann Savoy, MD  escitalopram (LEXAPRO) 20 MG tablet Take 1 tablet (20 mg total) by mouth daily. Patient  not taking: Reported on 06/01/2019 12/04/18 10/27/20  Arfeen, Phillips Grout, MD  norethindrone-ethinyl estradiol (NECON 0.5/35, 28,) 0.5-35 MG-MCG tablet Take 1 tablet by mouth daily. Patient not taking: Reported on 06/01/2019 10/08/18 10/27/20  Gerrit Heck, CNM    Family History Family History  Problem Relation Age of Onset   Ovarian cancer Mother    Ovarian cancer Sister    Hypertension Maternal Aunt    Diabetes Maternal Grandmother        great   Hypertension Maternal Grandmother        great   Diabetes Maternal Grandfather    Hypertension Maternal Grandfather    Cancer Maternal Grandfather    Cancer Paternal Grandfather    Hearing loss Neg Hx     Social History Social History   Tobacco Use   Smoking status: Never   Smokeless tobacco: Never  Vaping Use   Vaping status: Every Day  Substance Use Topics   Alcohol use: Not Currently   Drug use: Not Currently    Types: Marijuana     Allergies   Amoxicillin and Latex   Review of Systems Review of  Systems  Gastrointestinal:  Positive for abdominal pain.     Physical Exam Triage Vital Signs ED Triage Vitals [04/17/23 1925]  Encounter Vitals Group     BP 119/78     Systolic BP Percentile      Diastolic BP Percentile      Pulse Rate 92     Resp 17     Temp 98.6 F (37 C)     Temp Source Oral     SpO2 99 %     Weight      Height      Head Circumference      Peak Flow      Pain Score      Pain Loc      Pain Education      Exclude from Growth Chart    No data found.  Updated Vital Signs BP 119/78 (BP Location: Left Arm)   Pulse 92   Temp 98.6 F (37 C) (Oral)   Resp 17   LMP 03/02/2023 (Approximate)   SpO2 99%   Visual Acuity Right Eye Distance:   Left Eye Distance:   Bilateral Distance:    Right Eye Near:   Left Eye Near:    Bilateral Near:     Physical Exam Vitals and nursing note reviewed.  Constitutional:      General: She is not in acute distress.    Appearance: Normal appearance. She is not  ill-appearing.  HENT:     Head: Normocephalic and atraumatic.  Eyes:     Pupils: Pupils are equal, round, and reactive to light.  Cardiovascular:     Rate and Rhythm: Normal rate.  Pulmonary:     Effort: Pulmonary effort is normal.  Skin:    General: Skin is warm and dry.  Neurological:     General: No focal deficit present.     Mental Status: She is alert and oriented to person, place, and time.  Psychiatric:        Mood and Affect: Mood normal.        Behavior: Behavior normal.      UC Treatments / Results  Labs (all labs ordered are listed, but only abnormal results are displayed) Labs Reviewed - No data to display  EKG   Radiology No results found.  Procedures Procedures (including critical care time)  Medications Ordered in UC Medications - No data to display  Initial Impression / Assessment and Plan / UC Course  I have reviewed the triage vital signs and the nursing notes.  Pertinent labs & imaging results that were available during my care of the patient were reviewed by me and considered in my medical decision making (see chart for details).     Reviewed concerns with patient.  Discussed limitations and abilities of urgent care.  Given her symptoms as well as being pregnant advise she go to MAU at [facility] for further workup.  She is in agreement with plan will go POV to [facility] emergency room. Final Clinical Impressions(s) / UC Diagnoses   Final diagnoses:  Abdominal pain during pregnancy in first trimester  Vaginal bleeding in pregnancy     Discharge Instructions      Please go to the emergency room for further evaluation of your symptoms    ED Prescriptions   None    PDMP not reviewed this encounter.   Radford Pax, NP 04/17/23 [phone]

## 2023-04-17 NOTE — ED Triage Notes (Signed)
Pt presents with c/o abd cramping and vaginal bleeding x 3 days.   Pt states the cramping was shooting up to her stomach.

## 2023-04-17 NOTE — Discharge Instructions (Addendum)
Please go to the emergency room for further evaluation of your symptoms 

## 2023-04-17 NOTE — ED Notes (Signed)
Patient is being discharged from the Urgent Care and sent to the Emergency Department via POV . Per Cheri Rous NP, patient is in need of higher level of care due to abd cramping and vaginal bleeding. Patient is aware and verbalizes understanding of plan of care.  Vitals:   04/17/23 1925  BP: 119/78  Pulse: 92  Resp: 17  Temp: 98.6 F (37 C)  SpO2: 99%

## 2023-04-17 NOTE — MAU Note (Signed)
..  Rhonda Evans is a 27 y.o. at Unknown here in MAU reporting: was seen at  Is having abdominal cramping and vaginal bleeding that began on Monday.  Cramping (6/10)  is in her lower back and lower abdomen. Has not taken anything for the cramping.  Vaginal bleeding on Monday & Tuesday only when she wiped, has not had vaginal bleeding today.  Her occupation she needs to lift heavy things, is concerned that this caused this.  LMP: Early September Pain score: 6/10 Vitals:   04/17/23 2022  BP: 118/72  Pulse: 77  Resp: 16  Temp: 98.1 F (36.7 C)  SpO2: 100%     Lab orders placed from triage:  UA

## 2023-04-18 DIAGNOSIS — R109 Unspecified abdominal pain: Secondary | ICD-10-CM

## 2023-04-18 DIAGNOSIS — O26891 Other specified pregnancy related conditions, first trimester: Secondary | ICD-10-CM

## 2023-04-18 DIAGNOSIS — O26851 Spotting complicating pregnancy, first trimester: Secondary | ICD-10-CM

## 2023-04-18 DIAGNOSIS — Z3A01 Less than 8 weeks gestation of pregnancy: Secondary | ICD-10-CM

## 2023-04-25 DIAGNOSIS — Z3481 Encounter for supervision of other normal pregnancy, first trimester: Secondary | ICD-10-CM | POA: Insufficient documentation

## 2023-04-25 DIAGNOSIS — O3440 Maternal care for other abnormalities of cervix, unspecified trimester: Secondary | ICD-10-CM

## 2023-04-25 HISTORY — DX: Maternal care for other abnormalities of cervix, unspecified trimester: O34.40

## 2023-04-26 DIAGNOSIS — O09899 Supervision of other high risk pregnancies, unspecified trimester: Secondary | ICD-10-CM | POA: Insufficient documentation

## 2023-04-26 DIAGNOSIS — Z2839 Other underimmunization status: Secondary | ICD-10-CM

## 2023-04-26 HISTORY — DX: Other underimmunization status: Z28.39

## 2023-05-01 DIAGNOSIS — D582 Other hemoglobinopathies: Secondary | ICD-10-CM | POA: Insufficient documentation

## 2023-05-01 HISTORY — DX: Other hemoglobinopathies: D58.2

## 2023-06-26 NOTE — L&D Delivery Note (Signed)
 Delivery Note: Waterbirth  Rhonda Evans [id number] at [redacted]w[redacted]d  Admitting diagnosis: Normal labor [O80, Z37.9]   First Stage: Induction of labor: No Onset of labor: 12/03/2023 at 0700 Analgesia /Anesthesia/Pain control intrapartum: None  Second Stage: Consented for waterbirth and entered the WB tub.   Complete dilation at 12/03/2023 @1528  Birth parent pushing self directed. Supported by Mother-  FHR monitored intermittently and was appropriate throughout pushing.  Pushing in variable positions with waterbirth provider and LD support staff present for birth and supportive. Nuchal Cord: No  Delivery of a Live born female  Birth Weight: 7 lb 11.1 oz (3490 g) APGAR: 9, 9  Newborn Delivery   Birth date/time: 12/03/2023 15:31:00 Delivery type: Vaginal, Spontaneous    APGAR: 10 min-    Infant delivered in cephalic presentation, in OA position and shoulders delivered easily. Infant was grasped by birth parent with support of waterbirth provider Darrow End). Placed on birth parent chest.  Awaited cord to stop pulsation. Double clamped by Darrow End, MD MD, cut by  patients mother. Collection of cord blood for typing completed. Cord blood donation-None Arterial cord blood sample-No   Third Stage: Patient stood out of the water and with gentle traction placenta delivered into awaiting specimen tub. Placenta delivered Expressed with 3 vessels. Uterine tone firm, bleeding Small Uterotonics: Not indicated Placenta to L&D   None laceration identified.  Episiotomy:None Local analgesia: None  Repair: None Est. Blood Loss (mL):350.00  Complications: None   Mom to postpartum.  Baby to Nursery.  Delivery Report:  Review the Delivery Report for details.     Signed: Darrow End, MD  12/03/2023, 10:30 PM

## 2023-07-01 ENCOUNTER — Other Ambulatory Visit: Payer: Self-pay

## 2023-07-01 ENCOUNTER — Ambulatory Visit (INDEPENDENT_AMBULATORY_CARE_PROVIDER_SITE_OTHER): Payer: Medicaid Other | Admitting: Obstetrics & Gynecology

## 2023-07-01 ENCOUNTER — Encounter: Payer: Self-pay | Admitting: Obstetrics & Gynecology

## 2023-07-01 VITALS — BP 115/78 | HR 95 | Wt 180.0 lb

## 2023-07-01 DIAGNOSIS — Z3A16 16 weeks gestation of pregnancy: Secondary | ICD-10-CM

## 2023-07-01 DIAGNOSIS — O2341 Unspecified infection of urinary tract in pregnancy, first trimester: Secondary | ICD-10-CM

## 2023-07-01 DIAGNOSIS — O2342 Unspecified infection of urinary tract in pregnancy, second trimester: Secondary | ICD-10-CM

## 2023-07-01 DIAGNOSIS — Z3490 Encounter for supervision of normal pregnancy, unspecified, unspecified trimester: Secondary | ICD-10-CM | POA: Insufficient documentation

## 2023-07-01 DIAGNOSIS — Z1332 Encounter for screening for maternal depression: Secondary | ICD-10-CM

## 2023-07-01 MED ORDER — PRENATAL 28-0.8 MG PO TABS
1.0000 | ORAL_TABLET | Freq: Every day | ORAL | 12 refills | Status: AC
Start: 2023-07-01 — End: ?

## 2023-07-01 MED ORDER — BLOOD PRESSURE KIT DEVI
1.0000 | 0 refills | Status: AC
Start: 2023-07-01 — End: ?

## 2023-07-01 NOTE — Progress Notes (Signed)
   PRENATAL VISIT NOTE  Subjective:  Rhonda Evans is a 28 y.o. G3P2002 at [redacted]w[redacted]d being seen today for transfer of prenatal care from [facility].  Already had two visits there, already had all the labs, NIPS etc that were normal. Was our patient during her pregnancy in 2023, had a term and uncomplicated SVD.   She is currently monitored for the following issues for this low-risk pregnancy and has UTI (urinary tract infection) during pregnancy, first trimester; Hemoglobin C trait (HCC); and Supervision of low-risk pregnancy on their problem list.  Accompanied by FOB.  Patient reports no complaints.   . Vag. Bleeding: None.  Movement: Absent. Denies leaking of fluid.   The following portions of the patient's history were reviewed and updated as appropriate: allergies, current medications, past family history, past medical history, past social history, past surgical history and problem list.   Objective:   Vitals:   07/01/23 1423  BP: 115/78  Pulse: 95  Weight: 180 lb (81.6 kg)    Fetal Status: Fetal Heart Rate (bpm): 160   Movement: Absent     General:  Alert, oriented and cooperative. Patient is in no acute distress.  Skin: Skin is warm and dry. No rash noted.   Cardiovascular: Normal heart rate noted  Respiratory: Normal respiratory effort, no problems with respiration noted  Abdomen: Soft, gravid, appropriate for gestational age.  Pain/Pressure: Present     Pelvic: Cervical exam deferred        Extremities: Normal range of motion.  Edema: None  Mental Status: Normal mood and affect. Normal behavior. Normal judgment and thought content.   Assessment and Plan:  Pregnancy: G3P2002 at [redacted]w[redacted]d 1. UTI (urinary tract infection) during pregnancy, first trimester Recently treated for UTI, will do test of cure.  - Culture, OB Urine  2. [redacted] weeks gestation of pregnancy 3. Encounter for supervision of low-risk pregnancy, antepartum (Primary) LR NIPS.  AFP offered, patient will get, will follow up  results and manage accordingly. Anatomy scan scheduled.  BP cuff ordered for patient. Prenatal vitamins ordered as per her request.  The nature of [facility] with multiple MDs and other Advanced Practice Providers was re-explained to patient; also emphasized that residents, students are part of our team. - AFP, Serum, Open Spina Bifida - Prenatal 28-0.8 MG TABS; Take 1 tablet by mouth daily.  Dispense: 30 tablet; Refill: 12 - US  MFM OB COMP + 14 WK; Future - Blood Pressure Monitoring (BLOOD PRESSURE KIT) DEVI; 1 Device by Does not apply route once a week.  Dispense: 1 each; Refill: 0 No other complaints or concerns.  Second trimester expectations reviewed and all questions answered. Routine obstetric precautions reviewed.  Please refer to After Visit Summary for other counseling recommendations.   Return in about 4 weeks (around 07/29/2023) for OFFICE OB VISIT (MD or APP).  Future Appointments  Date Time Provider Department Center  07/23/2023  2:15 PM [facility] NURSE [facility]  07/23/2023  2:30 PM [facility] US4 [facility]    Gloris Hugger, MD

## 2023-07-01 NOTE — Patient Instructions (Addendum)
 Safe Medications in Pregnancy   Acne:  Benzoyl Peroxide  Salicylic Acid   Backache/Headache:  Tylenol: 2 regular strength every 4 hours OR               2 Extra strength every 6 hours   Colds/Coughs/Allergies:  Benadryl (alcohol free) 25 mg every 6 hours as needed  Breath right strips  Claritin  Cepacol throat lozenges  Chloraseptic throat spray  Cold-Eeze- up to three times per day  Cough drops, alcohol free  Flonase (by prescription only)  Guaifenesin  Mucinex  Robitussin DM (plain only, alcohol free)  Saline nasal spray/drops  Sudafed (pseudoephedrine) & Actifed * use only after [redacted] weeks gestation and if you do not have high blood pressure  Tylenol  Vicks Vaporub  Zinc lozenges  Zyrtec   Constipation:  Colace  Ducolax suppositories  Fleet enema  Glycerin suppositories  Metamucil  Milk of magnesia  Miralax  Senokot  Smooth move tea   Diarrhea:  Kaopectate  Imodium A-D   *NO pepto Bismol   Hemorrhoids:  Anusol  Anusol HC  Preparation H  Tucks   Indigestion:  Tums  Maalox  Mylanta  Zantac  Pepcid   Insomnia:  Benadryl (alcohol free) 25mg  every 6 hours as needed  Tylenol PM  Unisom, no Gelcaps   Leg Cramps:  Tums  MagGel   Nausea/Vomiting:  Bonine  Dramamine  Emetrol  Ginger extract  Sea bands  Meclizine  Nausea medication to take during pregnancy:  Unisom (doxylamine succinate 25 mg tablets) Take one tablet daily at bedtime. If symptoms are not adequately controlled, the dose can be increased to a maximum recommended dose of two tablets daily (1/2 tablet in the morning, 1/2 tablet mid-afternoon and one at bedtime).  Vitamin B6 100mg  tablets. Take one tablet twice a day (up to 200 mg per day).   Skin Rashes:  Aveeno products  Benadryl cream or 25mg  every 6 hours as needed  Calamine Lotion  1% cortisone cream   Yeast infection:  Gyne-lotrimin 7  Monistat 7    **If taking multiple medications, please check labels to avoid  duplicating the same active ingredients  **take medication as directed on the label  ** Do not exceed 4000 mg of tylenol in 24 hours  **Do not take medications that contain aspirin or ibuprofen            Considering Waterbirth? Guide for patients at [facility]) Why consider waterbirth? Gentle birth for babies  Less pain medicine used in labor  May allow for passive descent/less pushing  May reduce perineal tears  More mobility and instinctive maternal position changes  Increased maternal relaxation   Is waterbirth safe? What are the risks of infection, drowning or other complications? Infection:  Very low risk (3.7 % for tub vs 4.8% for bed)  7 in 8000 waterbirths with documented infection  Poorly cleaned equipment most common cause  Slightly lower group B strep transmission rate  Drowning  Maternal:  Very low risk  Related to seizures or fainting  Newborn:  Very low risk. No evidence of increased risk of respiratory problems in multiple large studies  Physiological protection from breathing under water  Avoid underwater birth if there are any fetal complications  Once baby's head is out of the water, keep it out.  Birth complication  Some reports of cord trauma, but risk decreased by bringing baby to surface gradually  No evidence of increased risk of shoulder dystocia. Mothers  can usually change positions faster in water than in a bed, possibly aiding the maneuvers to free the shoulder.   There are 2 things you MUST do to have a waterbirth with [facility]: Attend a waterbirth class at [facility] at [facility]   3rd Wednesday of every month from 7-9 pm (virtual during COVID) [employer] at [email or web address] or [email or web address] or by calling [phone] Bring [place] the certificate from the class to your prenatal appointment or send via MyChart Meet with a midwife at 36 weeks* to see if you can still plan a  waterbirth and to sign the consent.   *We also recommend that you schedule as many of your prenatal visits with a midwife as possible.    Helpful information: You may want to bring a bathing suit top to the hospital to wear during labor but this is optional.  All other supplies are provided by the hospital. Please arrive at the hospital with signs of active labor, and do not wait at home until late in labor. It takes 45 min- 1 hour for fetal monitoring, and check in to your room to take place, plus transport and filling of the waterbirth tub.    Things that would prevent you from having a waterbirth: Premature, <37wks  Previous cesarean birth  Presence of thick meconium-stained fluid  Multiple gestation (Twins, triplets, etc.)  Uncontrolled diabetes or gestational diabetes requiring medication  Hypertension diagnosed in pregnancy or preexisting hypertension (gestational hypertension, preeclampsia, or chronic hypertension) Fetal growth restriction (your baby measures less than 10th percentile on ultrasound) Heavy vaginal bleeding  Non-reassuring fetal heart rate  Active infection (MRSA, etc.). Group B Strep is NOT a contraindication for waterbirth.  If your labor has to be induced and induction method requires continuous monitoring of the baby's heart rate  Other risks/issues identified by your obstetrical provider   Please remember that birth is unpredictable. Under certain unforeseeable circumstances your provider may advise against giving birth in the tub. These decisions will be made on a case-by-case basis and with the safety of you and your baby as our highest priority.    Updated 09/27/21

## 2023-07-02 ENCOUNTER — Encounter: Payer: Self-pay | Admitting: *Deleted

## 2023-07-02 LAB — URINE CULTURE, OB REFLEX

## 2023-07-02 LAB — CULTURE, OB URINE

## 2023-07-04 LAB — AFP, SERUM, OPEN SPINA BIFIDA
AFP MoM: 0.81
AFP Value: 28.2 ng/mL
Gest. Age on Collection Date: 16.6 wk
Maternal Age At EDD: 26.5 a
OSBR Risk 1 IN: 10000
Test Results:: NEGATIVE
Weight: 180 [lb_av]

## 2023-07-23 ENCOUNTER — Ambulatory Visit: Payer: Medicaid Other

## 2023-07-29 ENCOUNTER — Telehealth: Payer: Medicaid Other | Admitting: Obstetrics and Gynecology

## 2023-07-29 DIAGNOSIS — Z3A21 21 weeks gestation of pregnancy: Secondary | ICD-10-CM

## 2023-07-29 DIAGNOSIS — Z3492 Encounter for supervision of normal pregnancy, unspecified, second trimester: Secondary | ICD-10-CM | POA: Diagnosis not present

## 2023-07-29 DIAGNOSIS — Z3490 Encounter for supervision of normal pregnancy, unspecified, unspecified trimester: Secondary | ICD-10-CM

## 2023-07-29 NOTE — Progress Notes (Signed)
  I connected with Rhonda Evans: MyChart video and verified that I am speaking with the correct person using two identifiers.  Patient is located at home and provider is located at [employer].     I discussed the limitations, risks, security and privacy concerns of performing an evaluation and management service by MyChart video and the availability of in person appointments. I also discussed with the patient that there may be a patient responsible charge related to this service. By engaging in this virtual visit, you consent to the provision of healthcare.  Additionally, you authorize for your insurance to be billed for the services provided during this visit.  The patient expressed understanding and agreed to proceed.          PRENATAL VISIT NOTE  Subjective:  Rhonda Evans is a 28 y.o. G3P2002 at [redacted]w[redacted]d being seen today for ongoing prenatal care.  She is currently monitored for the following issues for this low-risk pregnancy and has UTI (urinary tract infection) during pregnancy, first trimester; Hemoglobin C trait (HCC); and Supervision of low-risk pregnancy on their problem list.  Patient reports no complaints.  Contractions: Not present. Vag. Bleeding: None.  Movement: Present. Denies leaking of fluid.   The following portions of the patient's history were reviewed and updated as appropriate: allergies, current medications, past family history, past medical history, past social history, past surgical history and problem list.   Objective:  There were no vitals filed for this visit.  Fetal Status:     Movement: Present     General:  Alert, oriented and cooperative. Patient is in no acute distress.        Respiratory: Normal respiratory effort, no problems with respiration noted  Mental Status: Normal mood and affect. Normal behavior. Normal judgment and thought content.   Assessment and Plan:  Pregnancy: G3P2002 at [redacted]w[redacted]d 1. Encounter for supervision of low-risk pregnancy,  antepartum (Primary) Feeling regular fetal movement Doing well overall  2. [redacted] weeks gestation of pregnancy Questions surrounding milk supply, breast pump BP monitor sent, encouraged to start checking   Preterm labor symptoms and general obstetric precautions including but not limited to vaginal bleeding, contractions, leaking of fluid and fetal movement were reviewed in detail with the patient. Please refer to After Visit Summary for other counseling recommendations.   Return in about 4 weeks (around 08/26/2023) for OB VISIT (MD or APP).  Future Appointments  Date Time Provider Department Center  08/07/2023  2:15 PM [facility] NURSE [facility]  08/07/2023  2:30 PM [facility] US2 [facility]    Albertine Grates, FNP

## 2023-08-07 ENCOUNTER — Other Ambulatory Visit: Payer: Medicaid Other

## 2023-08-07 ENCOUNTER — Ambulatory Visit: Payer: Medicaid Other

## 2023-08-12 ENCOUNTER — Encounter: Payer: Medicaid Other | Admitting: Family Medicine

## 2023-08-13 ENCOUNTER — Other Ambulatory Visit: Payer: Self-pay

## 2023-08-13 ENCOUNTER — Emergency Department (HOSPITAL_BASED_OUTPATIENT_CLINIC_OR_DEPARTMENT_OTHER)
Admission: EM | Admit: 2023-08-13 | Discharge: 2023-08-13 | Discharge status: Against Medical Advice | Payer: Medicaid Other | Attending: Emergency Medicine | Admitting: Emergency Medicine

## 2023-08-13 ENCOUNTER — Encounter (HOSPITAL_BASED_OUTPATIENT_CLINIC_OR_DEPARTMENT_OTHER): Payer: Self-pay | Admitting: *Deleted

## 2023-08-13 DIAGNOSIS — O99512 Diseases of the respiratory system complicating pregnancy, second trimester: Secondary | ICD-10-CM | POA: Diagnosis not present

## 2023-08-13 DIAGNOSIS — J069 Acute upper respiratory infection, unspecified: Secondary | ICD-10-CM | POA: Insufficient documentation

## 2023-08-13 DIAGNOSIS — Z3A22 22 weeks gestation of pregnancy: Secondary | ICD-10-CM | POA: Diagnosis not present

## 2023-08-13 DIAGNOSIS — O26892 Other specified pregnancy related conditions, second trimester: Secondary | ICD-10-CM | POA: Diagnosis present

## 2023-08-13 DIAGNOSIS — Z5321 Procedure and treatment not carried out due to patient leaving prior to being seen by health care provider: Secondary | ICD-10-CM | POA: Diagnosis not present

## 2023-08-13 LAB — RESP PANEL BY RT-PCR (RSV, FLU A&B, COVID)  RVPGX2
Influenza A by PCR: NEGATIVE
Influenza B by PCR: NEGATIVE
Resp Syncytial Virus by PCR: NEGATIVE
SARS Coronavirus 2 by RT PCR: NEGATIVE

## 2023-08-13 NOTE — ED Triage Notes (Signed)
 Pt is here for feeling chest tightness since she woke up about an hour ago.  Pt seems to have some congestion and has a cough which began yesterday.  Pt is pregnant and states that she is due 12/12/2023 and she last felt her baby move yesterday. 22wks 5 days.  No contractions or leaking fluids

## 2023-08-13 NOTE — ED Triage Notes (Signed)
 Fetal HR 165-170 in triage

## 2023-08-22 ENCOUNTER — Other Ambulatory Visit: Payer: Self-pay

## 2023-08-22 ENCOUNTER — Ambulatory Visit: Payer: Medicaid Other

## 2023-08-22 ENCOUNTER — Ambulatory Visit (HOSPITAL_BASED_OUTPATIENT_CLINIC_OR_DEPARTMENT_OTHER): Payer: Medicaid Other | Admitting: Obstetrics

## 2023-08-22 ENCOUNTER — Ambulatory Visit: Payer: Medicaid Other | Attending: Obstetrics & Gynecology

## 2023-08-22 VITALS — BP 111/62 | HR 93

## 2023-08-22 DIAGNOSIS — Z3490 Encounter for supervision of normal pregnancy, unspecified, unspecified trimester: Secondary | ICD-10-CM | POA: Insufficient documentation

## 2023-08-22 DIAGNOSIS — Z363 Encounter for antenatal screening for malformations: Secondary | ICD-10-CM | POA: Insufficient documentation

## 2023-08-22 DIAGNOSIS — O358XX0 Maternal care for other (suspected) fetal abnormality and damage, not applicable or unspecified: Secondary | ICD-10-CM

## 2023-08-22 DIAGNOSIS — Z3492 Encounter for supervision of normal pregnancy, unspecified, second trimester: Secondary | ICD-10-CM | POA: Diagnosis present

## 2023-08-22 DIAGNOSIS — Z3A24 24 weeks gestation of pregnancy: Secondary | ICD-10-CM

## 2023-08-22 NOTE — Progress Notes (Signed)
 MFM Consult Note  Rhonda Evans was seen for a detailed fetal anatomy scan.  She recently transferred her care from [facility].  She denies any significant past medical history and denies any problems in her current pregnancy.    She had a cell free DNA test earlier in her pregnancy which indicated a low risk for trisomy 76, 67, and 13. A female fetus is predicted.   She was informed that the fetal growth and amniotic fluid level were appropriate for her gestational age.  The overall EFW measured at the 13th percentile.  There were no obvious fetal anomalies noted on today's ultrasound exam.  The patient was informed that anomalies may be missed due to technical limitations. If the fetus is in a suboptimal position or maternal habitus is increased, visualization of the fetus in the maternal uterus may be impaired.  Due to the lower normal EFW noted today, a follow-up growth scan was scheduled in 4 weeks.    The patient stated that all of her questions were answered today.  A total of 30 minutes was spent counseling and coordinating the care for this patient.  Greater than 50% of the time was spent in direct face-to-face contact.

## 2023-08-23 ENCOUNTER — Other Ambulatory Visit: Payer: Self-pay

## 2023-08-23 DIAGNOSIS — O09292 Supervision of pregnancy with other poor reproductive or obstetric history, second trimester: Secondary | ICD-10-CM

## 2023-08-26 ENCOUNTER — Encounter: Payer: Medicaid Other | Admitting: Obstetrics and Gynecology

## 2023-08-29 ENCOUNTER — Other Ambulatory Visit: Payer: Self-pay

## 2023-08-29 ENCOUNTER — Ambulatory Visit: Admitting: Obstetrics & Gynecology

## 2023-08-29 VITALS — BP 104/71 | HR 96 | Wt 185.0 lb

## 2023-08-29 DIAGNOSIS — Z3A25 25 weeks gestation of pregnancy: Secondary | ICD-10-CM

## 2023-08-29 DIAGNOSIS — Z3492 Encounter for supervision of normal pregnancy, unspecified, second trimester: Secondary | ICD-10-CM | POA: Diagnosis not present

## 2023-08-29 NOTE — Progress Notes (Signed)
   PRENATAL VISIT NOTE  Subjective:  Rhonda Evans is a 28 y.o. G3P2002 at [redacted]w[redacted]d being seen today for ongoing prenatal care.  She is currently monitored for the following issues for this low-risk pregnancy and has UTI (urinary tract infection) during pregnancy, first trimester; Hemoglobin C trait (HCC); and Supervision of low-risk pregnancy on their problem list.  Patient reports no complaints.  Contractions: Not present. Vag. Bleeding: None.  Movement: Present. Denies leaking of fluid.   The following portions of the patient's history were reviewed and updated as appropriate: allergies, current medications, past family history, past medical history, past social history, past surgical history and problem list.   Objective:   Vitals:   08/29/23 1603  BP: 104/71  Pulse: 96  Weight: 185 lb (83.9 kg)    Fetal Status: Fetal Heart Rate (bpm): 155   Movement: Present     General:  Alert, oriented and cooperative. Patient is in no acute distress.  Skin: Skin is warm and dry. No rash noted.   Cardiovascular: Normal heart rate noted  Respiratory: Normal respiratory effort, no problems with respiration noted  Abdomen: Soft, gravid, appropriate for gestational age.  Pain/Pressure: Absent     Pelvic: Cervical exam deferred        Extremities: Normal range of motion.  Edema: None  Mental Status: Normal mood and affect. Normal behavior. Normal judgment and thought content.   Assessment and Plan:  Pregnancy: G3P2002 at [redacted]w[redacted]d 1. [redacted] weeks gestation of pregnancy (Primary)   2. Encounter for supervision of low-risk pregnancy in second trimester   Preterm labor symptoms and general obstetric precautions including but not limited to vaginal bleeding, contractions, leaking of fluid and fetal movement were reviewed in detail with the patient. Please refer to After Visit Summary for other counseling recommendations.   Return in about 3 weeks (around 09/19/2023).  Future Appointments  Date Time  Provider Department Center  09/20/2023  3:30 PM [facility] US6 [facility]    Scheryl Darter, MD

## 2023-09-09 ENCOUNTER — Ambulatory Visit: Payer: Medicaid Other

## 2023-09-09 ENCOUNTER — Other Ambulatory Visit: Payer: Medicaid Other

## 2023-09-11 ENCOUNTER — Other Ambulatory Visit: Payer: Self-pay

## 2023-09-11 DIAGNOSIS — Z3A27 27 weeks gestation of pregnancy: Secondary | ICD-10-CM

## 2023-09-15 NOTE — Progress Notes (Unsigned)
   PRENATAL VISIT NOTE  Subjective:  Rhonda Evans is a 28 y.o. G3P2002 at [redacted]w[redacted]d being seen today for ongoing prenatal care.  She is currently monitored for the following issues for this {Blank single:19197::"high-risk","low-risk"} pregnancy and has UTI (urinary tract infection) during pregnancy, first trimester; Hemoglobin C trait (HCC); and Supervision of low-risk pregnancy on their problem list.  Patient reports {[id number]}.   .  .   . Denies leaking of fluid.   The following portions of the patient's history were reviewed and updated as appropriate: allergies, current medications, past family history, past medical history, past social history, past surgical history and problem list.   Objective:  There were no vitals filed for this visit.  Fetal Status:           General:  Alert, oriented and cooperative. Patient is in no acute distress.  Skin: Skin is warm and dry. No rash noted.   Cardiovascular: Normal heart rate noted  Respiratory: Normal respiratory effort, no problems with respiration noted  Abdomen: Soft, gravid, appropriate for gestational age.        Pelvic: {Blank single:19197::"Cervical exam performed in the presence of a chaperone","Cervical exam deferred"}        Extremities: Normal range of motion.     Mental Status: Normal mood and affect. Normal behavior. Normal judgment and thought content.   Assessment and Plan:  Pregnancy: G3P2002 at [redacted]w[redacted]d 1. Encounter for supervision of low-risk pregnancy in second trimester (Primary) ***  2. [redacted] weeks gestation of pregnancy ***  {Blank single:19197::"Term","Preterm"} labor symptoms and general obstetric precautions including but not limited to vaginal bleeding, contractions, leaking of fluid and fetal movement were reviewed in detail with the patient. Please refer to After Visit Summary for other counseling recommendations.   No follow-ups on file.  Future Appointments  Date Time Provider Department Center  09/17/2023   8:20 AM [facility]  09/17/2023  9:35 AM [facility]  09/20/2023  3:30 PM [facility] US6 [facility]    Richardson Landry, CNM

## 2023-09-17 ENCOUNTER — Other Ambulatory Visit: Payer: Self-pay

## 2023-09-17 ENCOUNTER — Other Ambulatory Visit: Payer: PRIVATE HEALTH INSURANCE

## 2023-09-17 ENCOUNTER — Ambulatory Visit: Payer: Self-pay | Admitting: Certified Nurse Midwife

## 2023-09-17 ENCOUNTER — Other Ambulatory Visit (HOSPITAL_COMMUNITY)
Admission: RE | Admit: 2023-09-17 | Discharge: 2023-09-17 | Disposition: A | Discharge status: Home / Self Care | Source: Ambulatory Visit | Attending: Certified Nurse Midwife | Admitting: Certified Nurse Midwife

## 2023-09-17 VITALS — BP 115/62 | HR 95 | Wt 188.0 lb

## 2023-09-17 DIAGNOSIS — Z3A28 28 weeks gestation of pregnancy: Secondary | ICD-10-CM | POA: Diagnosis not present

## 2023-09-17 DIAGNOSIS — N760 Acute vaginitis: Secondary | ICD-10-CM

## 2023-09-17 DIAGNOSIS — Z23 Encounter for immunization: Secondary | ICD-10-CM

## 2023-09-17 DIAGNOSIS — Z3A27 27 weeks gestation of pregnancy: Secondary | ICD-10-CM

## 2023-09-17 DIAGNOSIS — O26893 Other specified pregnancy related conditions, third trimester: Secondary | ICD-10-CM

## 2023-09-17 DIAGNOSIS — Z3483 Encounter for supervision of other normal pregnancy, third trimester: Secondary | ICD-10-CM | POA: Insufficient documentation

## 2023-09-17 DIAGNOSIS — Z3492 Encounter for supervision of normal pregnancy, unspecified, second trimester: Secondary | ICD-10-CM

## 2023-09-17 DIAGNOSIS — O26899 Other specified pregnancy related conditions, unspecified trimester: Secondary | ICD-10-CM

## 2023-09-17 DIAGNOSIS — R109 Unspecified abdominal pain: Secondary | ICD-10-CM | POA: Diagnosis not present

## 2023-09-17 LAB — POCT URINALYSIS DIP (DEVICE)
Bilirubin Urine: NEGATIVE
Glucose, UA: NEGATIVE mg/dL
Ketones, ur: NEGATIVE mg/dL
Leukocytes,Ua: NEGATIVE
Nitrite: NEGATIVE
Protein, ur: NEGATIVE mg/dL
Specific Gravity, Urine: >=1.03 (ref 1.005–1.030)
Urobilinogen, UA: 0.2 mg/dL (ref 0.0–1.0)
pH: 6 (ref 5.0–8.0)

## 2023-09-17 LAB — CERVICOVAGINAL ANCILLARY ONLY
Bacterial Vaginitis (gardnerella): POSITIVE — AB
Candida Glabrata: NEGATIVE
Candida Vaginitis: NEGATIVE
Chlamydia: NEGATIVE
Comment: NEGATIVE
Comment: NEGATIVE
Comment: NEGATIVE
Comment: NEGATIVE
Comment: NEGATIVE
Comment: NORMAL
Neisseria Gonorrhea: NEGATIVE
Trichomonas: NEGATIVE

## 2023-09-17 NOTE — Patient Instructions (Signed)
 Considering Waterbirth? Guide for patients at Center for [employer] [facility]) Why consider waterbirth? Gentle birth for babies  Less pain medicine used in labor  May allow for passive descent/less pushing  May reduce perineal tears  More mobility and instinctive maternal position changes  Increased maternal relaxation   Is waterbirth safe? What are the risks of infection, drowning or other complications? Infection:  Very low risk (3.7 % for tub vs 4.8% for bed)  7 in 8000 waterbirths with documented infection  Poorly cleaned equipment most common cause  Slightly lower group B strep transmission rate  Drowning  Maternal:  Very low risk  Related to seizures or fainting  Newborn:  Very low risk. No evidence of increased risk of respiratory problems in multiple large studies  Physiological protection from breathing under water  Avoid underwater birth if there are any fetal complications  Once baby's head is out of the water, keep it out.  Birth complication  Some reports of cord trauma, but risk decreased by bringing baby to surface gradually  No evidence of increased risk of shoulder dystocia. Mothers can usually change positions faster in water than in a bed, possibly aiding the maneuvers to free the shoulder.   There are 2 things you MUST do to have a waterbirth with [facility]: Attend a waterbirth class at [employer] [facility]   3rd Wednesday of every month from 7-9 pm (virtual during COVID) [employer] at [email or web address] or [email or web address] or by calling [phone] Bring [place] the certificate from the class to your prenatal appointment or send via MyChart Meet with a midwife at 36 weeks* to see if you can still plan a waterbirth and to sign the consent.   *We also recommend that you schedule as many of your prenatal visits with a midwife as possible.    Helpful information: You may want to bring a bathing suit top to the hospital  to wear during labor but this is optional.  All other supplies are provided by the hospital. Please arrive at the hospital with signs of active labor, and do not wait at home until late in labor. It takes 45 min- 1 hour for fetal monitoring, and check in to your room to take place, plus transport and filling of the waterbirth tub.    Things that would prevent you from having a waterbirth: Premature, <37wks  Previous cesarean birth  Presence of thick meconium-stained fluid  Multiple gestation (Twins, triplets, etc.)  Uncontrolled diabetes or gestational diabetes requiring medication  Hypertension diagnosed in pregnancy or preexisting hypertension (gestational hypertension, preeclampsia, or chronic hypertension) Fetal growth restriction (your baby measures less than 10th percentile on ultrasound) Heavy vaginal bleeding  Non-reassuring fetal heart rate  Active infection (MRSA, etc.). Group B Strep is NOT a contraindication for waterbirth.  If your labor has to be induced and induction method requires continuous monitoring of the baby's heart rate  Other risks/issues identified by your obstetrical provider   Please remember that birth is unpredictable. Under certain unforeseeable circumstances your provider may advise against giving birth in the tub. These decisions will be made on a case-by-case basis and with the safety of you and your baby as our highest priority.    Updated 09/27/21   We highly recommend childbirth education to help you plan for labor and begin practicing coping skills (which will be needed with or without pain meds).  [place] Childbirth Education Options: Sign up by visiting [email or web address]  Childbirth ~ Self-Paced eClass (  English and [place]) This online class offers you the freedom to complete a childbirth education series in the comfort of your own home at your own pace.  Childbirth Class (In-Person 4-Week Series  or on Saturdays, Virtual 4-Week Series ~  [place]) This interactive in-person class series will help you and your partner prepare for your birth experience. Topics include: Labor & Birth, Comfort Measures, Breathing Techniques, Massage, Medical Interventions, Pain Management Options, Cesarean Birth, Postpartum Care, and Newborn Care  Comfort Techniques for Labor ~ In-Person Class [facility]) This interactive class is designed for parents-to-be who want to learn & practice hands-on skills to help relieve some of the discomfort of labor and encourage their babies to rotate toward the best position for birth. Moms and their partners will be able to try a variety of labor positions with birth balls and rebozos as well as practice breathing, relaxation, and visualization techniques.  Natural Childbirth Class (In-Person 5-Week Series, In-Person on Saturdays or Virtual 5-Week Series ~ [place]) This class series is designed for expectant parents who want to learn and practice natural methods of coping with the process of labor and childbirth.  Cesarean Birth Self-Paced eClass (English and Spanish) This online course provides comprehensive information you can trust as you prepare for a possible cesarean birth. In this class, you'll learn how to make your birth and recovery comfortable and joyful through instructive video clips, animations, and activities.  Waterbirth ~ Airline pilot Interested in a waterbirth? In addition to a consultation with your credentialed waterbirth provider, this free, informational online class will help you discover whether waterbirth is the right fit for you. Not all obstetrical practices offer waterbirth, so check with your healthcare provider.  Tour Probation officer) - [facility] [employer] our 4 minute video tour of [facility] located in [place].    Parenting Education Options:  Pregnancy 101 (Virtual) Congratulations on your pregnancy!  This class is geared toward moms in their first trimester, but everyone is welcome. We are excited to guide you through all aspects of supporting a healthy pregnancy. You will learn what to expect at routine prenatal care appointments, common postpartum adjustments, basic infant safety, and breastfeeding.  Successful Partnering & Parenting ~ In-Person Workshop [facility]) This workshop inspires and equips partners of all economic levels, ages, and cultures to confidently care for their infants, support the birthing persons, and navigate their own transformations into new partners and parents. Learning activities are geared towards supporting partner, but moms are welcome to attend.  'Baby & Me' Parenting Group (Virtual on Wednesdays at 11am) Enjoy this time discussing newborn & infant parenting topics and family adjustment issues with other new parents in a relaxed environment. Each week brings a new speaker or baby-centered activity. This group offers support and connection to parents as they journey through the adjustments and struggles of that sometimes overwhelming first year after the birth of a child.  Baby Safety, CPR, & Choking Class ~ Virtual This life-saving information is meant to encourage parents as they learn important safety and prevention tips as well as infant CPR and relief of choking.  Breastfeeding Class (In-Person in [place] or [employer]) Families learn what to expect in the first days and weeks of breastfeeding your newborn.   Breastfeeding Self-Paced eClass (English & Spanish) Families learn what to expect in the first days and weeks of breastfeeding your newborn.  Caring for Baby ~ In-Person, Virtual or Self-Paced Class This in-person class is for both expectant  and adoptive parents who want to learn and practice the most up-to-date newborn care for their babies. Focus is on birth through the first six weeks of life.  CPR & Choking Relief for Infants & Children ~  In-Person Class [facility]) This in-person course is designed for any parent, expectant parent, or adult who cares for infants or children. Participants learn and demonstrate cardiopulmonary resuscitation and choking relief procedures for both infants and children.  Grandparent Love ~ In-Person Class Grandparents will learn the most updated infant care and safety recommendations. They will discover ways to support their own children during the transition into the parenting role and receive tips on communicating with the new parents.  [place] Parenting Support Group Options:  Bereavement Grief Support Group (Pregnancy/Infant Loss) - Virtual This is an ongoing experience that meets once a month and is designed to help you honor the past, assist you in discovering tools to strengthen you today, and aid you in developing hope for the future.  Breastfeeding & Pumping Support Group (In-Person on Thursdays at 12pm or Virtual on Tuesdays at 5pm) Join [place] in-person each Thursday starting June 1st, 2023 at 12pm! This support group is free for all families looking for breastfeeding and/or pumping support.   Community-Based Childbirth Education Options:  [facility] Department Classes:  Childbirth education classes can help you get ready for a positive parenting experience. You can also meet other expectant parents and get free stuff for your baby. Each class runs for five weeks on the same night and costs $45 for the mother-to-be and her support person. Medicaid covers the cost if you are eligible. Call [phone] to register.  [facility] [employer] offers a variety of programs for the [employer] and is another great way to get connected. Please go to [email or web address] for more information.  Childbirth With A Twist! Be informed of your options, get educated on birth, understand what your body is doing, learn how to cope, and have a lot of fun and laughs all  while doing it either from the comfort of your couch OR in our cozy office and classroom space near the [place] airport. If you are taking a virtual class, then class is taught LIVE, so you can ask questions and receive answers in real-time from an experienced doula and childbirth educator.  This virtual childbirth education class will meet for five instruction times online.  Although we are based in [place], [place], this virtual class is open to anyone in the world. Please visit: [email or web address] for more information.  Books We Love: The Doula Guide to Childbirth by Harland German and Otila Back The First-Time Parent's Childbirth Handbook by Dr. Amie Critchley, CNM The Birth Partner by Truddie Crumble

## 2023-09-18 ENCOUNTER — Encounter: Payer: Self-pay | Admitting: Certified Nurse Midwife

## 2023-09-18 MED ORDER — METRONIDAZOLE 500 MG PO TABS: 500.0000 mg | ORAL_TABLET | Freq: Two times a day (BID) | ORAL | 0 refills | Status: DC

## 2023-09-18 NOTE — Addendum Note (Signed)
 Addended by: Lamont Snowball A on: 09/18/2023 10:15 AM   Modules accepted: Orders

## 2023-09-19 ENCOUNTER — Encounter: Payer: Self-pay | Admitting: Certified Nurse Midwife

## 2023-09-19 ENCOUNTER — Other Ambulatory Visit: Payer: Self-pay | Admitting: Certified Nurse Midwife

## 2023-09-19 DIAGNOSIS — O99013 Anemia complicating pregnancy, third trimester: Secondary | ICD-10-CM

## 2023-09-19 LAB — CBC
Hematocrit: 32.2 % — ABNORMAL LOW (ref 34.0–46.6)
Hemoglobin: 10.3 g/dL — ABNORMAL LOW (ref 11.1–15.9)
MCH: 24.2 pg — ABNORMAL LOW (ref 26.6–33.0)
MCHC: 32 g/dL (ref 31.5–35.7)
MCV: 76 fL — ABNORMAL LOW (ref 79–97)
Platelets: 266 10*3/uL (ref 150–450)
RBC: 4.25 x10E6/uL (ref 3.77–5.28)
RDW: 15.9 % — ABNORMAL HIGH (ref 11.7–15.4)
WBC: 7.7 10*3/uL (ref 3.4–10.8)

## 2023-09-19 LAB — RPR: RPR Ser Ql: NONREACTIVE

## 2023-09-19 LAB — GLUCOSE TOLERANCE, 2 HOURS W/ 1HR
Glucose, 1 hour: 125 mg/dL (ref 70–179)
Glucose, 2 hour: 99 mg/dL (ref 70–152)
Glucose, Fasting: 79 mg/dL (ref 70–91)

## 2023-09-19 LAB — HIV ANTIBODY (ROUTINE TESTING W REFLEX): HIV Screen 4th Generation wRfx: NONREACTIVE

## 2023-09-19 MED ORDER — ACCRUFER 30 MG PO CAPS: 1.0000 | ORAL_CAPSULE | Freq: Every day | ORAL | 3 refills | Status: DC

## 2023-09-20 ENCOUNTER — Ambulatory Visit: Payer: Medicaid Other | Attending: Obstetrics

## 2023-09-20 DIAGNOSIS — O09292 Supervision of pregnancy with other poor reproductive or obstetric history, second trimester: Secondary | ICD-10-CM | POA: Diagnosis present

## 2023-09-20 DIAGNOSIS — O99013 Anemia complicating pregnancy, third trimester: Secondary | ICD-10-CM | POA: Diagnosis not present

## 2023-09-20 DIAGNOSIS — Z3A28 28 weeks gestation of pregnancy: Secondary | ICD-10-CM | POA: Diagnosis not present

## 2023-09-20 DIAGNOSIS — D573 Sickle-cell trait: Secondary | ICD-10-CM | POA: Diagnosis not present

## 2023-09-22 ENCOUNTER — Inpatient Hospital Stay (HOSPITAL_COMMUNITY)
Admission: AD | Admit: 2023-09-22 | Discharge: 2023-09-22 | Disposition: A | Discharge status: Home / Self Care | Attending: Obstetrics & Gynecology | Admitting: Obstetrics & Gynecology

## 2023-09-22 ENCOUNTER — Other Ambulatory Visit: Payer: Self-pay

## 2023-09-22 ENCOUNTER — Encounter (HOSPITAL_COMMUNITY): Payer: Self-pay | Admitting: Obstetrics & Gynecology

## 2023-09-22 DIAGNOSIS — R109 Unspecified abdominal pain: Secondary | ICD-10-CM | POA: Diagnosis not present

## 2023-09-22 DIAGNOSIS — R197 Diarrhea, unspecified: Secondary | ICD-10-CM | POA: Insufficient documentation

## 2023-09-22 DIAGNOSIS — O26899 Other specified pregnancy related conditions, unspecified trimester: Secondary | ICD-10-CM

## 2023-09-22 DIAGNOSIS — O4703 False labor before 37 completed weeks of gestation, third trimester: Secondary | ICD-10-CM | POA: Diagnosis present

## 2023-09-22 DIAGNOSIS — O26893 Other specified pregnancy related conditions, third trimester: Secondary | ICD-10-CM | POA: Diagnosis not present

## 2023-09-22 DIAGNOSIS — Z3A29 29 weeks gestation of pregnancy: Secondary | ICD-10-CM | POA: Diagnosis not present

## 2023-09-22 LAB — URINALYSIS, ROUTINE W REFLEX MICROSCOPIC
Bilirubin Urine: NEGATIVE
Glucose, UA: NEGATIVE mg/dL
Hgb urine dipstick: NEGATIVE
Ketones, ur: 20 mg/dL — AB
Leukocytes,Ua: NEGATIVE
Nitrite: NEGATIVE
Protein, ur: NEGATIVE mg/dL
Specific Gravity, Urine: 1.026 (ref 1.005–1.030)
pH: 5 (ref 5.0–8.0)

## 2023-09-22 MED ORDER — LOPERAMIDE HCL 2 MG PO CAPS
2.0000 mg | ORAL_CAPSULE | Freq: Once | ORAL | Status: AC
  Administered 2023-09-22: 2 mg via ORAL
  Filled 2023-09-22: qty 1

## 2023-09-22 NOTE — MAU Note (Signed)
.  Rhonda Evans is a 28 y.o. at [redacted]w[redacted]d here in MAU reporting: Patient reports contraction for several days. Patient reports ctx's, but now has had non stop diarrhea.   Onset of complaint: couple days Pain score: 5/10 back There were no vitals filed for this visit.    Lab orders placed from triage:   ua

## 2023-09-22 NOTE — Discharge Instructions (Signed)

## 2023-09-22 NOTE — MAU Provider Note (Signed)
 History     CSN: [id number]  Arrival date and time: 09/22/23 1341   Event Date/Time   First Provider Initiated Contact with Patient 09/22/23 1434      Chief Complaint  Patient presents with   Contractions   HPI Rhonda Evans is a 28 y.o. G3P2002 at [redacted]w[redacted]d who presents for abdominal cramping & diarrhea. Symptoms started this morning. Reports that she had cramping & rectal pain. Since then she has had 3 watery stools. Pain improves with bowel movements. Ate a bagel this morning. Ate seafood yesterday. Unknown sick contacts. Denies fever/chills, n/v, dysuria, vaginal bleeding, or LOF. Good fetal movement.   OB History     Gravida  3   Para  2   Term  2   Preterm      AB      Living  2      SAB      IAB      Ectopic      Multiple  0   Live Births  2           Past Medical History:  Diagnosis Date   Anxiety    Breast mass, right 11/06/2021   Depression    Hemoglobin C trait (HCC) 05/01/2023   History of LEEP (loop electrosurgical excision procedure) of cervix complicating pregnancy 04/25/2023   2023 CIN 2 negative margins  Repeat pap 04/25/2023     Will plan repeat US around 15-16 weeks to check cervical length.     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   Susceptible to varicella (non-immune), currently pregnant 04/26/2023   Vaginal Pap smear, abnormal     Past Surgical History:  Procedure Laterality Date   CERVICAL BIOPSY      Family History  Problem Relation Age of Onset   Ovarian cancer Mother    Ovarian cancer Sister    Hypertension Maternal Aunt    Diabetes Maternal Grandmother        great   Hypertension Maternal Grandmother        great   Diabetes Maternal Grandfather    Hypertension Maternal Grandfather    Cancer Maternal Grandfather    Cancer Paternal Grandfather    Hearing loss Neg Hx     Social History   Tobacco Use   Smoking status: Never   Smokeless tobacco: Never  Vaping Use   Vaping status: Former  Substance Use  Topics   Alcohol use: Not Currently   Drug use: Not Currently    Types: Marijuana    Allergies:  Allergies  Allergen Reactions   Amoxicillin Anaphylaxis and Rash    Has patient had a PCN reaction causing immediate rash, facial/tongue/throat swelling, SOB or lightheadedness with hypotension: Yes Has patient had a PCN reaction causing severe rash involving mucus membranes or skin necrosis: No Has patient had a PCN reaction that required hospitalization Yes Has patient had a PCN reaction occurring within the last 10 years: No If all of the above answers are "NO", then may proceed with Cephalosporin use.    Penicillins Anaphylaxis, Other (See Comments) and Rash    Has patient had a PCN reaction causing immediate rash, facial/tongue/throat swelling, SOB or lightheadedness with hypotension: Yes Has patient had a PCN reaction causing severe rash involving mucus membranes or skin necrosis: No Has patient had a PCN reaction that required hospitalization Yes Has patient had a PCN reaction occurring within the last 10 years: No If all of the above answers are "NO", then  may proceed with Cephalosporin use.  rash   Latex Rash    No medications prior to admission.    Review of Systems  All other systems reviewed and are negative.  Physical Exam   Blood pressure 105/64, pulse 96, temperature 97.9 F (36.6 C), resp. rate 16, last menstrual period 03/02/2023, SpO2 100%, not currently breastfeeding.  Physical Exam Vitals and nursing note reviewed.  Constitutional:      General: She is not in acute distress.    Appearance: She is well-developed. She is not ill-appearing.  HENT:     Head: Normocephalic and atraumatic.  Eyes:     General: No scleral icterus.       Right eye: No discharge.        Left eye: No discharge.     Conjunctiva/sclera: Conjunctivae normal.  Pulmonary:     Effort: Pulmonary effort is normal. No respiratory distress.  Neurological:     General: No focal deficit  present.     Mental Status: She is alert.  Psychiatric:        Mood and Affect: Mood normal.        Behavior: Behavior normal.    NST:  Baseline: 155 bpm, Variability: Good {> 6 bpm), Accelerations: Reactive, Decelerations: Absent, and Toco quiet  MAU Course  Procedures Results for orders placed or performed during the hospital encounter of 09/22/23 (from the past 24 hours)  Urinalysis, Routine w reflex microscopic -Urine, Clean Catch     Status: Abnormal   Collection Time: 09/22/23  1:55 PM  Result Value Ref Range   Color, Urine YELLOW YELLOW   APPearance HAZY (A) CLEAR   Specific Gravity, Urine 1.026 1.005 - 1.030   pH 5.0 5.0 - 8.0   Glucose, UA NEGATIVE NEGATIVE mg/dL   Hgb urine dipstick NEGATIVE NEGATIVE   Bilirubin Urine NEGATIVE NEGATIVE   Ketones, ur 20 (A) NEGATIVE mg/dL   Protein, ur NEGATIVE NEGATIVE mg/dL   Nitrite NEGATIVE NEGATIVE   Leukocytes,Ua NEGATIVE NEGATIVE    MDM   Assessment and Plan   1. Diarrhea, unspecified type   2. Abdominal cramping affecting pregnancy   3. [redacted] weeks gestation of pregnancy    -Cervix closed/thick & no contractions on monitor. Suspect cramping related to GI illness. Given Imodium in triage.  -Take imodium prn. Push fluids. Bland diet -Reviewed reasons to return to MAU  Rhonda Evans 09/22/2023, 4:13 PM

## 2023-09-30 NOTE — Progress Notes (Unsigned)
   PRENATAL VISIT NOTE  Subjective:  Rhonda Evans is a 28 y.o. G3P2002 at [redacted]w[redacted]d being seen today for ongoing prenatal care.  She is currently monitored for the following issues for this {Blank single:19197::"high-risk","low-risk"} pregnancy and has UTI (urinary tract infection) during pregnancy, first trimester; Hemoglobin C trait (HCC); and Supervision of low-risk pregnancy on their problem list.  Patient reports {sx:14538}.   .  .   . Denies leaking of fluid.   The following portions of the patient's history were reviewed and updated as appropriate: allergies, current medications, past family history, past medical history, past social history, past surgical history and problem list.   Objective:  There were no vitals filed for this visit.  Fetal Status:           General:  Alert, oriented and cooperative. Patient is in no acute distress.  Skin: Skin is warm and dry. No rash noted.   Cardiovascular: Normal heart rate noted  Respiratory: Normal respiratory effort, no problems with respiration noted  Abdomen: Soft, gravid, appropriate for gestational age.        Pelvic: {Blank single:19197::"Cervical exam performed in the presence of a chaperone","Cervical exam deferred"}        Extremities: Normal range of motion.     Mental Status: Normal mood and affect. Normal behavior. Normal judgment and thought content.   Assessment and Plan:  Pregnancy: G3P2002 at [redacted]w[redacted]d 1. Encounter for supervision of low-risk pregnancy in third trimester (Primary) ***  2. [redacted] weeks gestation of pregnancy ***  {Blank single:19197::"Term","Preterm"} labor symptoms and general obstetric precautions including but not limited to vaginal bleeding, contractions, leaking of fluid and fetal movement were reviewed in detail with the patient. Please refer to After Visit Summary for other counseling recommendations.   Return in about 2 weeks (around 10/15/2023) for LOB, with CNM please.  Future Appointments  Date  Time Provider Department Center  10/01/2023  3:15 PM Leafy Half [facility]    Richardson Landry, CNM

## 2023-10-01 ENCOUNTER — Ambulatory Visit (INDEPENDENT_AMBULATORY_CARE_PROVIDER_SITE_OTHER): Admitting: Certified Nurse Midwife

## 2023-10-01 ENCOUNTER — Other Ambulatory Visit: Payer: Self-pay

## 2023-10-01 VITALS — BP 105/71 | HR 98 | Wt 188.0 lb

## 2023-10-01 DIAGNOSIS — Z3493 Encounter for supervision of normal pregnancy, unspecified, third trimester: Secondary | ICD-10-CM

## 2023-10-01 DIAGNOSIS — Z3A30 30 weeks gestation of pregnancy: Secondary | ICD-10-CM

## 2023-10-17 ENCOUNTER — Encounter: Admitting: Advanced Practice Midwife

## 2023-10-22 ENCOUNTER — Encounter: Payer: Self-pay | Admitting: Certified Nurse Midwife

## 2023-10-24 ENCOUNTER — Encounter: Admitting: Family Medicine

## 2023-11-07 ENCOUNTER — Other Ambulatory Visit: Payer: Self-pay

## 2023-11-07 ENCOUNTER — Other Ambulatory Visit (HOSPITAL_COMMUNITY)
Admission: RE | Admit: 2023-11-07 | Discharge: 2023-11-07 | Disposition: A | Discharge status: Home / Self Care | Source: Ambulatory Visit | Attending: Obstetrics and Gynecology | Admitting: Obstetrics and Gynecology

## 2023-11-07 ENCOUNTER — Ambulatory Visit: Admitting: Family Medicine

## 2023-11-07 ENCOUNTER — Encounter: Payer: Self-pay | Admitting: Family Medicine

## 2023-11-07 VITALS — BP 117/69 | HR 108 | Wt 194.0 lb

## 2023-11-07 DIAGNOSIS — O26893 Other specified pregnancy related conditions, third trimester: Secondary | ICD-10-CM | POA: Diagnosis not present

## 2023-11-07 DIAGNOSIS — R12 Heartburn: Secondary | ICD-10-CM

## 2023-11-07 DIAGNOSIS — Z3A35 35 weeks gestation of pregnancy: Secondary | ICD-10-CM | POA: Diagnosis not present

## 2023-11-07 DIAGNOSIS — N898 Other specified noninflammatory disorders of vagina: Secondary | ICD-10-CM | POA: Insufficient documentation

## 2023-11-07 DIAGNOSIS — Z3493 Encounter for supervision of normal pregnancy, unspecified, third trimester: Secondary | ICD-10-CM

## 2023-11-07 MED ORDER — FAMOTIDINE 20 MG PO TABS: 20.0000 mg | ORAL_TABLET | Freq: Two times a day (BID) | ORAL | 3 refills | Status: DC

## 2023-11-07 NOTE — Progress Notes (Signed)
   PRENATAL VISIT NOTE  Subjective:  Rhonda Evans is a 28 y.o. G3P2002 at [redacted]w[redacted]d being seen today for ongoing prenatal care.  She is currently monitored for the following issues for this low-risk pregnancy and has Hemoglobin C trait (HCC) and Supervision of low-risk pregnancy on their problem list. She took some of her prescription for treating BV, though she admits she did not take it as prescribed and has had no improvement in symptoms.   Patient reports occasional contractions [facility]) causing lower abdominal pressure and back pain. She also had terrible heartburn but is not taking any medication. Contractions: Irritability. Vag. Bleeding: None.  Movement: Present. Denies leaking of fluid.   The following portions of the patient's history were reviewed and updated as appropriate: allergies, current medications, past family history, past medical history, past social history, past surgical history and problem list.   Objective:    Vitals:   11/07/23 1525 11/07/23 1555  BP: (!) 141/70 117/69  Pulse: (!) 104 (!) 108  Weight: 194 lb (88 kg)     Fetal Status:  Fetal Heart Rate (bpm): 159 Fundal Height: 34 cm Movement: Present    General: Alert, oriented and cooperative. Patient is in no acute distress.  Skin: Skin is warm and dry. No rash noted.   Cardiovascular: Normal heart rate noted  Respiratory: Normal respiratory effort, no problems with respiration noted  Abdomen: Soft, gravid, appropriate for gestational age.  Pain/Pressure: Present     Pelvic: Cervical exam deferred        Extremities: Normal range of motion.  Edema: Trace  Mental Status: Normal mood and affect. Normal behavior. Normal judgment and thought content.   Assessment and Plan:  Pregnancy: G3P2002 at [redacted]w[redacted]d 1. Encounter for supervision of low-risk pregnancy in third trimester (Primary) BP and FHR normal Doing well, feeling regular movement   Continue prenatal vitamin and iron  supplement.  2. Heartburn  during pregnancy in third trimester Start famotidine 20 mg twice daily. May use TUMS as needed. - famotidine (PEPCID) 20 MG tablet; Take 1 tablet (20 mg total) by mouth 2 (two) times daily.  Dispense: 60 tablet; Refill: 3  3. Vaginal discharge Rechecking swab today. If positive, send Metrogel  per pt preference. - Cervicovaginal ancillary only  4. [redacted] weeks gestation of pregnancy FH appropriate. Plan for GBS and GC/CT swab at next appointment.  Preterm labor symptoms and general obstetric precautions including but not limited to vaginal bleeding, contractions, leaking of fluid and fetal movement were reviewed in detail with the patient. Please refer to After Visit Summary for other counseling recommendations.   Return in about 1 week (around 11/14/2023) for LOB.  Future Appointments  Date Time Provider Department Center  11/15/2023  8:55 AM Rhonda Evans [facility]  11/21/2023  2:55 PM Rhonda Hidden, FNP [facility]  11/29/2023 10:55 AM Rhonda Flatten, NP [facility]    Noreene Bearded, PA

## 2023-11-07 NOTE — Patient Instructions (Addendum)
 I will get in touch with you if the test is positive for BV!  LOW IRON : Either ferrous sulfate , ferrous gluconate, or ferrous fumarate. The recommendation is 100 mg every other day.  These can all be found over-the-counter.  This can be constipating, so you can also add a daily stool softener (Miralax) if needed.  About 10% of people will have difficulty with iron  and it will cause GI upset.  To reduce the chance of GI upset, take the supplement with food. Taking it at night so you sleep through potential side effects can also be helpful. If you are having constipation, nausea or vomiting from the iron  tablets, you could try a liquid over-the-counter iron  supplement called Floradix (found at [employer] or [employer]) daily. It is not as strong but is much easier on your stomach. Also try to get as much iron  as you can through diet--meat, beans, lentils, eggs, dark green leafy vegetables.   BELLY/BACK PAIN: [email or web address] - great movements to try! Soak in a warm tub Tylenol  1000 mg by mouth every 6-8 hrs as needed for pain Slow position changes Do pelvic massages and chair stretches before getting out of bed and before getting in bed (as demonstrated to you) Laying on the affected side Maternity support belt Massage: Starting at the middle of your pubic bone, trace little circles in a wide U from your pubic bone to your hip bones on both sides.  Then starting just above your pubic bone, press in and down, alternating sides to create a gentle rocking of your uterus back and forth.  Move your hands up the sides of your belly and back down. Do this 3-5 times upon waking and before bed. Stretches: Get on hands and knees and alternate arching your back deeply while inhaling, and then rounding your back while exhaling. Modified runners lunge:  - Sit on a chair with half of your bottom on the chair and half off.  - Sit up tall, plant your front foot, and stretch your other foot out behind you.   - Breathe deeply for 5 breaths and then do the other side. Also chiropractors and massages are safe in pregnancy. [facility] in [place] specializes in pregnancy care. You can visit their website at: [email or web address] to schedule a new patient chiropractic treatment (1 hour) appointment. Please let us  know if you have any other questions or concerns. Maternity Support [employer] can purchase on [place] or through [place]. Below is an example.

## 2023-11-08 ENCOUNTER — Ambulatory Visit: Payer: Self-pay | Admitting: Family Medicine

## 2023-11-08 LAB — CERVICOVAGINAL ANCILLARY ONLY
Bacterial Vaginitis (gardnerella): NEGATIVE
Candida Glabrata: NEGATIVE
Candida Vaginitis: NEGATIVE
Comment: NEGATIVE
Comment: NEGATIVE
Comment: NEGATIVE

## 2023-11-13 DIAGNOSIS — D649 Anemia, unspecified: Secondary | ICD-10-CM | POA: Insufficient documentation

## 2023-11-13 NOTE — Progress Notes (Unsigned)
   PRENATAL VISIT NOTE  Subjective:  Rhonda Evans is a 28 y.o. G3P2002 at [redacted]w[redacted]d being seen today for ongoing prenatal care.  She is currently monitored for the following issues for this {Blank single:19197::"high-risk","low-risk"} pregnancy and has Hemoglobin C trait (HCC); Supervision of low-risk pregnancy; and Anemia on their problem list.   Hemorrhoids, sore to touch   Patient reports {sx:[id number]}.  Contractions: Irritability. Vag. Bleeding: None.  Movement: Present. Denies leaking of fluid.   The following portions of the patient's history were reviewed and updated as appropriate: allergies, current medications, past family history, past medical history, past social history, past surgical history and problem list.   Objective:    Vitals:   11/15/23 0854  BP: 116/81  Pulse: (!) 101  Weight: 195 lb 9.6 oz (88.7 kg)    Fetal Status:  Fetal Heart Rate (bpm): 161   Movement: Present    General: Alert, oriented and cooperative. Patient is in no acute distress.  Skin: Skin is warm and dry. No rash noted.   Cardiovascular: Normal heart rate noted  Respiratory: Normal respiratory effort, no problems with respiration noted  Abdomen: Soft, gravid, appropriate for gestational age.  Pain/Pressure: Present     Pelvic: Cervical exam deferred        Extremities: Normal range of motion.  Edema: Trace  Mental Status: Normal mood and affect. Normal behavior. Normal judgment and thought content.   Assessment and Plan:  Pregnancy: G3P2002 at [redacted]w[redacted]d  1. Encounter for supervision of low-risk pregnancy in third trimester (Primary) Patient doing well, feeling regular fetal movement BP, FHR, FH appropriate   2. [redacted] weeks gestation of pregnancy Anticipatory guidance about next visits/weeks of pregnancy given.   3. Anemia, unspecified type Hgb 10.3 09/17/23 Compliant on Accrufer  30 mg ***  Preterm labor symptoms and general obstetric precautions including but not limited to vaginal bleeding,  contractions, leaking of fluid and fetal movement were reviewed in detail with the patient.  Please refer to After Visit Summary for other counseling recommendations.   No follow-ups on file.  Future Appointments  Date Time Provider Department Center  11/21/2023  2:55 PM Rhonda Hidden, FNP [facility]  11/29/2023 10:55 AM Rhonda Flatten, NP [facility]    Rhonda Saha, PA-C

## 2023-11-15 ENCOUNTER — Other Ambulatory Visit: Payer: Self-pay

## 2023-11-15 ENCOUNTER — Other Ambulatory Visit (HOSPITAL_COMMUNITY)
Admission: RE | Admit: 2023-11-15 | Discharge: 2023-11-15 | Disposition: A | Discharge status: Home / Self Care | Source: Ambulatory Visit | Attending: Physician Assistant | Admitting: Physician Assistant

## 2023-11-15 ENCOUNTER — Ambulatory Visit: Admitting: Physician Assistant

## 2023-11-15 VITALS — BP 116/81 | HR 101 | Wt 195.6 lb

## 2023-11-15 DIAGNOSIS — D649 Anemia, unspecified: Secondary | ICD-10-CM

## 2023-11-15 DIAGNOSIS — O99013 Anemia complicating pregnancy, third trimester: Secondary | ICD-10-CM | POA: Diagnosis not present

## 2023-11-15 DIAGNOSIS — Z3493 Encounter for supervision of normal pregnancy, unspecified, third trimester: Secondary | ICD-10-CM

## 2023-11-15 DIAGNOSIS — Z3A36 36 weeks gestation of pregnancy: Secondary | ICD-10-CM

## 2023-11-15 DIAGNOSIS — O99613 Diseases of the digestive system complicating pregnancy, third trimester: Secondary | ICD-10-CM

## 2023-11-15 DIAGNOSIS — K648 Other hemorrhoids: Secondary | ICD-10-CM | POA: Diagnosis not present

## 2023-11-17 ENCOUNTER — Inpatient Hospital Stay (HOSPITAL_COMMUNITY)
Admission: AD | Admit: 2023-11-17 | Discharge: 2023-11-17 | Disposition: A | Discharge status: Home / Self Care | Attending: Obstetrics and Gynecology | Admitting: Obstetrics and Gynecology

## 2023-11-17 DIAGNOSIS — R102 Pelvic and perineal pain: Secondary | ICD-10-CM | POA: Diagnosis present

## 2023-11-17 DIAGNOSIS — Z3A37 37 weeks gestation of pregnancy: Secondary | ICD-10-CM | POA: Diagnosis not present

## 2023-11-17 DIAGNOSIS — O99891 Other specified diseases and conditions complicating pregnancy: Secondary | ICD-10-CM | POA: Diagnosis not present

## 2023-11-17 DIAGNOSIS — Z3689 Encounter for other specified antenatal screening: Secondary | ICD-10-CM | POA: Diagnosis not present

## 2023-11-17 DIAGNOSIS — M549 Dorsalgia, unspecified: Secondary | ICD-10-CM | POA: Diagnosis not present

## 2023-11-17 DIAGNOSIS — M5489 Other dorsalgia: Secondary | ICD-10-CM | POA: Insufficient documentation

## 2023-11-17 LAB — URINALYSIS, ROUTINE W REFLEX MICROSCOPIC
Bilirubin Urine: NEGATIVE
Glucose, UA: NEGATIVE mg/dL
Ketones, ur: NEGATIVE mg/dL
Leukocytes,Ua: NEGATIVE
Nitrite: NEGATIVE
Protein, ur: 30 mg/dL — AB
Specific Gravity, Urine: 1.021 (ref 1.005–1.030)
pH: 5 (ref 5.0–8.0)

## 2023-11-17 NOTE — MAU Provider Note (Signed)
 History     CSN: [id number]  Arrival date and time: 11/17/23 0047   Event Date/Time   First Provider Initiated Contact with Patient 11/17/23 0202      Chief Complaint  Patient presents with   Pelvic Pain   Back Pain   Rhonda Evans is a 28 y.o. G3P2002 at [redacted]w[redacted]d who receives care at [facility].  She presents today for back and pelvic pain. She reports pain started prior to arrival. She states the back pain is lower back and intermittent, but not like contractions.  She reports the pelvic pain "felt like she was trying to get out of there."  She expresses concern as she states the pain felt different. She rates the pain a 8/10, but is currently not having any. She endorses fetal movement and denies vaginal concerns.   OB History     Gravida  3   Para  2   Term  2   Preterm      AB      Living  2      SAB      IAB      Ectopic      Multiple  0   Live Births  2           Past Medical History:  Diagnosis Date   Anxiety    Breast mass, right 11/06/2021   Depression    Hemoglobin C trait (HCC) 05/01/2023   History of LEEP (loop electrosurgical excision procedure) of cervix complicating pregnancy 04/25/2023   2023 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   Susceptible to varicella (non-immune), currently pregnant 04/26/2023   UTI (urinary tract infection) during pregnancy, first trimester 07/12/2021   04/25/2023 +UTI rx for keflex sent>> Negative TOC on 07/01/2023     Vaginal Pap smear, abnormal     Past Surgical History:  Procedure Laterality Date   CERVICAL BIOPSY      Family History  Problem Relation Age of Onset   Ovarian cancer Mother    Ovarian cancer Sister    Hypertension Maternal Aunt    Diabetes Maternal Grandmother        great   Hypertension Maternal Grandmother        great   Diabetes Maternal Grandfather    Hypertension  Maternal Grandfather    Cancer Maternal Grandfather    Cancer Paternal Grandfather    Hearing loss Neg Hx     Social History   Tobacco Use   Smoking status: Never   Smokeless tobacco: Never  Vaping Use   Vaping status: Former  Substance Use Topics   Alcohol use: Not Currently   Drug use: Not Currently    Types: Marijuana    Allergies:  Allergies  Allergen Reactions   Amoxicillin Anaphylaxis and Rash    Has patient had a PCN reaction causing immediate rash, facial/tongue/throat swelling, SOB or lightheadedness with hypotension: Yes Has patient had a PCN reaction causing severe rash involving mucus membranes or skin necrosis: No Has patient had a PCN reaction that required hospitalization Yes Has patient had a PCN reaction occurring within the last 10 years: No If all of the above answers are "NO", then may proceed with Cephalosporin use.    Penicillins Anaphylaxis, Other (See Comments) and Rash    Has patient had a PCN reaction causing immediate  rash, facial/tongue/throat swelling, SOB or lightheadedness with hypotension: Yes Has patient had a PCN reaction causing severe rash involving mucus membranes or skin necrosis: No Has patient had a PCN reaction that required hospitalization Yes Has patient had a PCN reaction occurring within the last 10 years: No If all of the above answers are "NO", then may proceed with Cephalosporin use.  rash   Latex Rash    Medications Prior to Admission  Medication Sig Dispense Refill Last Dose/Taking   Blood Pressure Monitoring (BLOOD PRESSURE KIT) DEVI 1 Device by Does not apply route once a week. (Patient not taking: Reported on 07/29/2023) 1 each 0    famotidine  (PEPCID ) 20 MG tablet Take 1 tablet (20 mg total) by mouth 2 (two) times daily. 60 tablet 3    Ferric Maltol  (ACCRUFER ) 30 MG CAPS Take 1 capsule (30 mg total) by mouth daily. 30 capsule 3    ondansetron  (ZOFRAN -ODT) 4 MG disintegrating tablet Take 1 tablet (4 mg total) by mouth every 8  (eight) hours as needed for nausea or vomiting. (Patient not taking: Reported on 07/01/2023) 20 tablet 0    Prenatal 28-0.8 MG TABS Take 1 tablet by mouth daily. 30 tablet 12     Review of Systems  Gastrointestinal:  Positive for abdominal pain. Negative for nausea and vomiting.  Genitourinary:  Negative for difficulty urinating, dysuria, vaginal bleeding and vaginal discharge.  Musculoskeletal:  Positive for back pain.   Physical Exam   Blood pressure 115/70, pulse 95, temperature 98.3 F (36.8 C), temperature source Oral, resp. rate 19, weight 87.8 kg, last menstrual period 03/02/2023, SpO2 100%.  Physical Exam Vitals reviewed.  Constitutional:      Appearance: Normal appearance.  HENT:     Head: Normocephalic and atraumatic.  Eyes:     Conjunctiva/sclera: Conjunctivae normal.  Cardiovascular:     Rate and Rhythm: Normal rate.  Pulmonary:     Effort: Pulmonary effort is normal. No respiratory distress.  Abdominal:     Palpations: Abdomen is soft.  Musculoskeletal:        General: Normal range of motion.     Cervical back: Normal range of motion.  Skin:    General: Skin is warm and dry.  Neurological:     Mental Status: She is alert and oriented to person, place, and time.  Psychiatric:        Mood and Affect: Mood normal.        Behavior: Behavior normal.     Fetal Assessment 155 bpm, Mod Var, -Decels, +Accels Toco: Irritability graphed  MAU Course   Results for orders placed or performed during the hospital encounter of 11/17/23 (from the past 24 hours)  Urinalysis, Routine w reflex microscopic -Urine, Clean Catch     Status: Abnormal   Collection Time: 11/17/23  2:08 AM  Result Value Ref Range   Color, Urine YELLOW YELLOW   APPearance HAZY (A) CLEAR   Specific Gravity, Urine 1.021 1.005 - 1.030   pH 5.0 5.0 - 8.0   Glucose, UA NEGATIVE NEGATIVE mg/dL   Hgb urine dipstick SMALL (A) NEGATIVE   Bilirubin Urine NEGATIVE NEGATIVE   Ketones, ur NEGATIVE NEGATIVE  mg/dL   Protein, ur 30 (A) NEGATIVE mg/dL   Nitrite NEGATIVE NEGATIVE   Leukocytes,Ua NEGATIVE NEGATIVE   RBC / HPF 0-5 0 - 5 RBC/hpf   WBC, UA 0-5 0 - 5 WBC/hpf   Bacteria, UA RARE (A) NONE SEEN   Squamous Epithelial / HPF 11-20 0 - 5 /  HPF   Mucus PRESENT    No results found.  MDM PE Labs: UA EFM  Assessment and Plan  28 year old G3P2002  SIUP at 37.1 weeks Cat I FT Back Pain Pelvic Pain  -POC Reviewed. -Exam performed. -Patient offered and declines pain medication. -Reviewed recent labs and CV swab negative for BV and yeast which could contribute to feelings of pelvic pressure/pain.  -Reviewed cervical exam. Informed that pain likely from contraction or normal anticipated discomforts of pregnancy. -Discussed obtaining UA and sending results via mychart while treating if necessary. -Return precautions reviewed.  -Patient agreeable and without questions. -UA ordered. -Nurse updated on plan.  -Discharged to home in stable condition.  Kraig Peru MSN, CNM 11/17/2023, 2:03 AM

## 2023-11-17 NOTE — MAU Note (Signed)
 Patient presents with vaginal pressure that she states "is more than it has been". Onset at 0000 this morning. Patient rating pain 8/10. Patient denies vaginal bleeding or loss of fluid.  Coy Ditty, RN

## 2023-11-17 NOTE — MAU Note (Signed)
 MAU Triage Note:  .Rhonda Evans is a 28 y.o. at [redacted]w[redacted]d here in MAU reporting: sudden onset pelvic pressure and back pressure that is intermittent. Denies VB or LOF. Reports +FM.   Patient complaint: lower abd pain, nausea  Pain Score: 8  Pain Location: Pelvis Pain Score: 8 Pain Location: Back   Onset of complaint: Today LMP: Patient's last menstrual period was 03/02/2023 (approximate).  Vitals:   11/17/23 0057  BP: (!) 119/56  Pulse: (!) 121  Resp: 18  Temp: 98.5 F (36.9 C)  SpO2: 100%    FHT:  Fetal Heart Rate Mode: External Baseline Rate (A): 121 bpm Lab orders placed from triage: N/A

## 2023-11-18 MED ORDER — HYDROCORTISONE (PERIANAL) 2.5 % EX CREA: TOPICAL_CREAM | Freq: Two times a day (BID) | CUTANEOUS | 2 refills | Status: DC

## 2023-11-19 ENCOUNTER — Telehealth: Payer: Self-pay | Admitting: Family Medicine

## 2023-11-19 LAB — CERVICOVAGINAL ANCILLARY ONLY
Chlamydia: NEGATIVE
Comment: NEGATIVE
Comment: NEGATIVE
Comment: NORMAL
Neisseria Gonorrhea: NEGATIVE
Trichomonas: NEGATIVE

## 2023-11-19 LAB — STREP GP B CULTURE+RFLX: Strep Gp B Culture+Rflx: NEGATIVE

## 2023-11-19 NOTE — Telephone Encounter (Signed)
 Patient is calling because she ordered a breast pump through her insurance and they told her they were waiting on us  to send over something to them. They faxed over a prescription request but has yet to receive a fax back.

## 2023-11-20 ENCOUNTER — Ambulatory Visit: Payer: Self-pay | Admitting: Physician Assistant

## 2023-11-21 ENCOUNTER — Other Ambulatory Visit: Payer: Self-pay

## 2023-11-21 ENCOUNTER — Ambulatory Visit: Payer: Self-pay | Admitting: Obstetrics and Gynecology

## 2023-11-21 VITALS — BP 112/84 | HR 101 | Wt 195.7 lb

## 2023-11-21 DIAGNOSIS — O99013 Anemia complicating pregnancy, third trimester: Secondary | ICD-10-CM

## 2023-11-21 DIAGNOSIS — Z3493 Encounter for supervision of normal pregnancy, unspecified, third trimester: Secondary | ICD-10-CM

## 2023-11-21 DIAGNOSIS — Z3A37 37 weeks gestation of pregnancy: Secondary | ICD-10-CM

## 2023-11-21 DIAGNOSIS — D649 Anemia, unspecified: Secondary | ICD-10-CM | POA: Diagnosis not present

## 2023-11-21 NOTE — Progress Notes (Signed)
   PRENATAL VISIT NOTE  Subjective:  Rhonda Evans is a 28 y.o. G3P2002 at [redacted]w[redacted]d being seen today for ongoing prenatal care.  She is currently monitored for the following issues for this low-risk pregnancy and has Hemoglobin C trait (HCC); Supervision of low-risk pregnancy; and Anemia on their problem list.  Patient reports no complaints.  Contractions: Irritability. Vag. Bleeding: None.  Movement: Present. Denies leaking of fluid.   The following portions of the patient's history were reviewed and updated as appropriate: allergies, current medications, past family history, past medical history, past social history, past surgical history and problem list.   Objective:    Vitals:   11/21/23 1508  BP: 112/84  Pulse: (!) 101  Weight: 195 lb 11.2 oz (88.8 kg)    Fetal Status:  Fetal Heart Rate (bpm): 160 Fundal Height: 38 cm Movement: Present Presentation: Vertex  General: Alert, oriented and cooperative. Patient is in no acute distress.  Skin: Skin is warm and dry. No rash noted.   Cardiovascular: Normal heart rate noted  Respiratory: Normal respiratory effort, no problems with respiration noted  Abdomen: Soft, gravid, appropriate for gestational age.  Pain/Pressure: Present     Pelvic: Cervical exam performed in the presence of a chaperone Dilation: 1 Effacement (%): 50 Station: -3  Extremities: Normal range of motion.  Edema: None  Mental Status: Normal mood and affect. Normal behavior. Normal judgment and thought content.   Assessment and Plan:  Pregnancy: G3P2002 at [redacted]w[redacted]d 1. Encounter for supervision of low-risk pregnancy in third trimester (Primary) BP and FHR normal Doing well, feeling regular movement    2. [redacted] weeks gestation of pregnancy Labor readiness discussed   3. Anemia, unspecified type Continue oral iron   Preterm labor symptoms and general obstetric precautions including but not limited to vaginal bleeding, contractions, leaking of fluid and fetal movement were  reviewed in detail with the patient. Please refer to After Visit Summary for other counseling recommendations.   Return in about 1 week (around 11/28/2023) for OB VISIT (MD or APP).    Susi Eric, FNP

## 2023-11-27 ENCOUNTER — Telehealth: Payer: Self-pay | Admitting: Family Medicine

## 2023-11-27 NOTE — Telephone Encounter (Signed)
 Attempted to return call to patient. She did not answer and mailbox full and cannot accept messages.   Will send Mychart message.

## 2023-11-27 NOTE — Telephone Encounter (Signed)
 Patient is requesting a call back, she have two questions: Her Mucus Plug Rx for her Breast Pump

## 2023-11-28 ENCOUNTER — Telehealth: Payer: Self-pay | Admitting: Family Medicine

## 2023-11-28 ENCOUNTER — Encounter: Payer: Self-pay | Admitting: Lactation Services

## 2023-11-29 ENCOUNTER — Other Ambulatory Visit: Payer: Self-pay

## 2023-11-29 ENCOUNTER — Ambulatory Visit: Payer: Self-pay | Admitting: Student

## 2023-11-29 VITALS — BP 125/70 | HR 79 | Wt 193.0 lb

## 2023-11-29 DIAGNOSIS — O99013 Anemia complicating pregnancy, third trimester: Secondary | ICD-10-CM

## 2023-11-29 DIAGNOSIS — Z3A38 38 weeks gestation of pregnancy: Secondary | ICD-10-CM

## 2023-11-29 DIAGNOSIS — O99613 Diseases of the digestive system complicating pregnancy, third trimester: Secondary | ICD-10-CM | POA: Diagnosis not present

## 2023-11-29 DIAGNOSIS — R12 Heartburn: Secondary | ICD-10-CM | POA: Diagnosis not present

## 2023-11-29 DIAGNOSIS — D649 Anemia, unspecified: Secondary | ICD-10-CM

## 2023-11-29 DIAGNOSIS — Z3493 Encounter for supervision of normal pregnancy, unspecified, third trimester: Secondary | ICD-10-CM

## 2023-11-29 DIAGNOSIS — O26893 Other specified pregnancy related conditions, third trimester: Secondary | ICD-10-CM

## 2023-11-29 MED ORDER — FAMOTIDINE 20 MG PO TABS: 20.0000 mg | ORAL_TABLET | Freq: Two times a day (BID) | ORAL | 3 refills | Status: DC

## 2023-11-29 MED ORDER — ACCRUFER 30 MG PO CAPS
1.0000 | ORAL_CAPSULE | Freq: Every day | ORAL | 3 refills | Status: DC
Start: 2023-11-29 — End: 2023-12-05

## 2023-11-29 NOTE — Progress Notes (Signed)
   PRENATAL VISIT NOTE  Subjective:  Rhonda Evans is a 28 y.o. G3P2002 at [redacted]w[redacted]d being seen today for ongoing prenatal care.  She is currently monitored for the following issues for this low-risk pregnancy and has Hemoglobin C trait (HCC); Supervision of low-risk pregnancy; and Anemia on their problem list.  Patient reports no complaints.  Contractions: Irritability. Vag. Bleeding: None.  Movement: Present. Denies leaking of fluid.   The following portions of the patient's history were reviewed and updated as appropriate: allergies, current medications, past family history, past medical history, past social history, past surgical history and problem list.   Objective:    Vitals:   11/29/23 1106  BP: 125/70  Pulse: 79  Weight: 193 lb (87.5 kg)    Fetal Status:  Fetal Heart Rate (bpm): 156   Movement: Present    General: Alert, oriented and cooperative. Patient is in no acute distress.  Skin: Skin is warm and dry. No rash noted.   Cardiovascular: Normal heart rate noted  Respiratory: Normal respiratory effort, no problems with respiration noted  Abdomen: Soft, gravid, appropriate for gestational age.  Pain/Pressure: Present (pressure)     Pelvic: Cervical exam performed in the presence of a chaperone Dilation: 1.5 Effacement (%): 50 Station: -3  Extremities: Normal range of motion.  Edema: None  Mental Status: Normal mood and affect. Normal behavior. Normal judgment and thought content.   Assessment and Plan:  Pregnancy: G3P2002 at [redacted]w[redacted]d 1. Encounter for supervision of low-risk pregnancy in third trimester (Primary) - routine care - frequent fetal movement   2. [redacted] weeks gestation of pregnancy   3. Heartburn during pregnancy in third trimester - famotidine  (PEPCID ) 20 MG tablet; Take 1 tablet (20 mg total) by mouth 2 (two) times daily.  Dispense: 60 tablet; Refill: 3  4. Anemia, unspecified type - Ferric Maltol  (ACCRUFER ) 30 MG CAPS; Take 1 capsule (30 mg total) by mouth  daily.  Dispense: 30 capsule; Refill: 3  Term labor symptoms and general obstetric precautions including but not limited to vaginal bleeding, contractions, leaking of fluid and fetal movement were reviewed in detail with the patient. Please refer to After Visit Summary for other counseling recommendations.   Return in about 1 week (around 12/06/2023) for LOB, IN-PERSON.  Future Appointments  Date Time Provider Department Center  12/05/2023  2:35 PM Zelma Hidden, FNP [facility]    Denzil Flatten, NP

## 2023-11-29 NOTE — Telephone Encounter (Signed)
 Please disregard this error

## 2023-12-01 ENCOUNTER — Encounter (HOSPITAL_COMMUNITY): Payer: Self-pay | Admitting: Family Medicine

## 2023-12-01 ENCOUNTER — Inpatient Hospital Stay (HOSPITAL_COMMUNITY)
Admission: AD | Admit: 2023-12-01 | Discharge: 2023-12-01 | Disposition: A | Discharge status: Home / Self Care | Attending: Family Medicine | Admitting: Family Medicine

## 2023-12-01 DIAGNOSIS — O471 False labor at or after 37 completed weeks of gestation: Secondary | ICD-10-CM

## 2023-12-01 DIAGNOSIS — Z3A39 39 weeks gestation of pregnancy: Secondary | ICD-10-CM

## 2023-12-01 DIAGNOSIS — O479 False labor, unspecified: Secondary | ICD-10-CM

## 2023-12-01 NOTE — MAU Note (Signed)
  MAU Labor Triage Note:  .Rhonda Evans is a 28 y.o. at [redacted]w[redacted]d here in MAU reporting:  Contractions every: few minutes Onset of ctx: Yesterday Pain Score: 8  Pain Location: Abdomen  ROM: intact Vaginal Bleeding: none Last SVE: unsure, had a membrane sweep yesterday Labor Pain Management Plan: Planning Waterbirth  GBS: Negative  RN Assessment:  Fetal Movement: Reports positive FM FHT: Fetal Heart Rate Mode: External Baseline Rate (A): 145 bpm  Vitals:   12/01/23 0151 12/01/23 0155  BP: 117/83   Pulse: (!) 106   Resp: 17   Temp: 98.5 F (36.9 C)   SpO2:  100%

## 2023-12-01 NOTE — MAU Provider Note (Signed)
 Labor Check     S: Ms. Rhonda Evans is a 28 y.o. G3P2002 at [redacted]w[redacted]d  who presents to MAU today complaining contractions "few minutes" since yesterday following membrane sweep. She denies vaginal bleeding. She denies LOF. She reports normal fetal movement.  Planning waterbirth  O: BP 117/83 (BP Location: Right Arm)   Pulse (!) 106   Temp 98.5 F (36.9 C) (Oral)   Resp 17   Ht 5\' 6"  (1.676 m)   Wt 89.1 kg   LMP 03/02/2023 (Approximate)   SpO2 100%   BMI 31.70 kg/m  GENERAL: Well-developed, well-nourished female in no acute distress.  HEAD: Normocephalic, atraumatic.  CHEST: Normal effort of breathing, regular heart rate ABDOMEN: Soft, nontender, gravid  Cervical exam:  Dilation: 1 Effacement (%): 70 Cervical Position: Posterior Station: -3 Presentation: Vertex Exam by:: Gae Jointer, RN   Fetal Monitoring: Baseline: 140 Variability: moderate Accelerations: + Decelerations: none Contractions:   SVE unchanged from prior Reactive NST Stable for D/C  A: SIUP at [redacted]w[redacted]d  False labor  P: Strict/usual return precautions  Ebony Goldstein, MD 12/01/2023 3:35 AM

## 2023-12-02 ENCOUNTER — Telehealth: Payer: Self-pay | Admitting: Family Medicine

## 2023-12-02 NOTE — Telephone Encounter (Signed)
 Patient called in requesting to speak with a Nurse, patient refused to say what she was needing other then " I need a nurse to call me, I have several questions"

## 2023-12-02 NOTE — Telephone Encounter (Signed)
 Attempted to return call to patient. Patient did not answer and mailbox full so not able to leave a message.

## 2023-12-03 ENCOUNTER — Encounter (HOSPITAL_COMMUNITY): Payer: Self-pay | Admitting: Obstetrics and Gynecology

## 2023-12-03 ENCOUNTER — Other Ambulatory Visit: Payer: Self-pay

## 2023-12-03 ENCOUNTER — Inpatient Hospital Stay (HOSPITAL_COMMUNITY)
Admission: AD | Admit: 2023-12-03 | Discharge: 2023-12-05 | DRG: 807 | Disposition: A | Discharge status: Home / Self Care | Attending: Family Medicine | Admitting: Family Medicine

## 2023-12-03 DIAGNOSIS — Z3490 Encounter for supervision of normal pregnancy, unspecified, unspecified trimester: Secondary | ICD-10-CM

## 2023-12-03 DIAGNOSIS — D509 Iron deficiency anemia, unspecified: Secondary | ICD-10-CM | POA: Diagnosis present

## 2023-12-03 DIAGNOSIS — O9902 Anemia complicating childbirth: Principal | ICD-10-CM | POA: Diagnosis present

## 2023-12-03 DIAGNOSIS — Z91040 Latex allergy status: Secondary | ICD-10-CM | POA: Diagnosis not present

## 2023-12-03 DIAGNOSIS — Z880 Allergy status to penicillin: Secondary | ICD-10-CM | POA: Diagnosis not present

## 2023-12-03 DIAGNOSIS — Z3A39 39 weeks gestation of pregnancy: Secondary | ICD-10-CM

## 2023-12-03 DIAGNOSIS — M940 Chondrocostal junction syndrome [Tietze]: Secondary | ICD-10-CM | POA: Diagnosis not present

## 2023-12-03 DIAGNOSIS — Z8249 Family history of ischemic heart disease and other diseases of the circulatory system: Secondary | ICD-10-CM | POA: Diagnosis not present

## 2023-12-03 DIAGNOSIS — D582 Other hemoglobinopathies: Secondary | ICD-10-CM | POA: Diagnosis present

## 2023-12-03 DIAGNOSIS — D649 Anemia, unspecified: Secondary | ICD-10-CM | POA: Diagnosis present

## 2023-12-03 DIAGNOSIS — Z833 Family history of diabetes mellitus: Secondary | ICD-10-CM | POA: Diagnosis not present

## 2023-12-03 DIAGNOSIS — Z3493 Encounter for supervision of normal pregnancy, unspecified, third trimester: Principal | ICD-10-CM

## 2023-12-03 DIAGNOSIS — O26893 Other specified pregnancy related conditions, third trimester: Secondary | ICD-10-CM | POA: Diagnosis present

## 2023-12-03 DIAGNOSIS — O99892 Other specified diseases and conditions complicating childbirth: Secondary | ICD-10-CM | POA: Diagnosis not present

## 2023-12-03 LAB — CBC
HCT: 33.8 % — ABNORMAL LOW (ref 36.0–46.0)
Hemoglobin: 10.4 g/dL — ABNORMAL LOW (ref 12.0–15.0)
MCH: 20.7 pg — ABNORMAL LOW (ref 26.0–34.0)
MCHC: 30.8 g/dL (ref 30.0–36.0)
MCV: 67.3 fL — ABNORMAL LOW (ref 80.0–100.0)
Platelets: 328 10*3/uL (ref 150–400)
RBC: 5.02 MIL/uL (ref 3.87–5.11)
RDW: 19.8 % — ABNORMAL HIGH (ref 11.5–15.5)
WBC: 10.2 10*3/uL (ref 4.0–10.5)
nRBC: 0 % (ref 0.0–0.2)

## 2023-12-03 LAB — TYPE AND SCREEN
ABO/RH(D): O POS
Antibody Screen: NEGATIVE

## 2023-12-03 LAB — COMPREHENSIVE METABOLIC PANEL WITH GFR
ALT: 11 U/L (ref 0–44)
AST: 22 U/L (ref 15–41)
Albumin: 3.1 g/dL — ABNORMAL LOW (ref 3.5–5.0)
Alkaline Phosphatase: 217 U/L — ABNORMAL HIGH (ref 38–126)
Anion gap: 13 (ref 5–15)
BUN: <5 mg/dL — ABNORMAL LOW (ref 6–20)
CO2: 18 mmol/L — ABNORMAL LOW (ref 22–32)
Calcium: 9.5 mg/dL (ref 8.9–10.3)
Chloride: 107 mmol/L (ref 98–111)
Creatinine, Ser: 0.38 mg/dL — ABNORMAL LOW (ref 0.44–1.00)
GFR, Estimated: >60 mL/min (ref >60)
Glucose, Bld: 67 mg/dL — ABNORMAL LOW (ref 70–99)
Potassium: 3.2 mmol/L — ABNORMAL LOW (ref 3.5–5.1)
Sodium: 138 mmol/L (ref 135–145)
Total Bilirubin: 1.2 mg/dL (ref 0.0–1.2)
Total Protein: 7.4 g/dL (ref 6.5–8.1)

## 2023-12-03 LAB — POCT FERN TEST: POCT Fern Test: NEGATIVE

## 2023-12-03 MED ORDER — OXYTOCIN BOLUS FROM INFUSION: 333.0000 mL | Freq: Once | INTRAVENOUS | Status: DC

## 2023-12-03 MED ORDER — PRENATAL MULTIVITAMIN CH
1.0000 | ORAL_TABLET | Freq: Every day | ORAL | Status: DC
  Administered 2023-12-04 – 2023-12-05 (×2): 1 via ORAL
  Filled 2023-12-03 (×2): qty 1

## 2023-12-03 MED ORDER — ACETAMINOPHEN 325 MG PO TABS: 650.0000 mg | ORAL_TABLET | ORAL | Status: DC | PRN

## 2023-12-03 MED ORDER — ACETAMINOPHEN 500 MG PO TABS
1000.0000 mg | ORAL_TABLET | Freq: Three times a day (TID) | ORAL | Status: DC
Start: 2023-12-03 — End: 2023-12-05
  Administered 2023-12-03 – 2023-12-05 (×3): 1000 mg via ORAL
  Filled 2023-12-03 (×3): qty 2

## 2023-12-03 MED ORDER — ONDANSETRON HCL 4 MG/2ML IJ SOLN: 4.0000 mg | INTRAMUSCULAR | Status: DC | PRN

## 2023-12-03 MED ORDER — OXYCODONE-ACETAMINOPHEN 5-325 MG PO TABS
2.0000 | ORAL_TABLET | ORAL | Status: DC | PRN
  Administered 2023-12-03: 2 via ORAL
  Filled 2023-12-03: qty 2

## 2023-12-03 MED ORDER — COCONUT OIL OIL: 1.0000 | TOPICAL_OIL | Status: DC | PRN

## 2023-12-03 MED ORDER — BENZOCAINE-MENTHOL 20-0.5 % EX AERO
1.0000 | INHALATION_SPRAY | CUTANEOUS | Status: DC | PRN
  Filled 2023-12-03: qty 56

## 2023-12-03 MED ORDER — MEDROXYPROGESTERONE ACETATE 150 MG/ML IM SUSP: 150.0000 mg | INTRAMUSCULAR | Status: DC | PRN

## 2023-12-03 MED ORDER — SOD CITRATE-CITRIC ACID 500-334 MG/5ML PO SOLN: 30.0000 mL | ORAL | Status: DC | PRN

## 2023-12-03 MED ORDER — ONDANSETRON HCL 4 MG/2ML IJ SOLN: 4.0000 mg | Freq: Four times a day (QID) | INTRAMUSCULAR | Status: DC | PRN

## 2023-12-03 MED ORDER — OXYCODONE HCL 5 MG PO TABS
10.0000 mg | ORAL_TABLET | Freq: Four times a day (QID) | ORAL | Status: DC | PRN
  Administered 2023-12-04 – 2023-12-05 (×5): 10 mg via ORAL
  Filled 2023-12-03 (×5): qty 2

## 2023-12-03 MED ORDER — DIBUCAINE (PERIANAL) 1 % EX OINT
1.0000 | TOPICAL_OINTMENT | CUTANEOUS | Status: DC | PRN
Start: 2023-12-03 — End: 2023-12-05

## 2023-12-03 MED ORDER — DIPHENHYDRAMINE HCL 25 MG PO CAPS: 25.0000 mg | ORAL_CAPSULE | Freq: Four times a day (QID) | ORAL | Status: DC | PRN

## 2023-12-03 MED ORDER — OXYTOCIN-SODIUM CHLORIDE 30-0.9 UT/500ML-% IV SOLN: 2.5000 [IU]/h | INTRAVENOUS | Status: DC

## 2023-12-03 MED ORDER — ZOLPIDEM TARTRATE 5 MG PO TABS: 5.0000 mg | ORAL_TABLET | Freq: Every evening | ORAL | Status: DC | PRN

## 2023-12-03 MED ORDER — FENTANYL CITRATE (PF) 100 MCG/2ML IJ SOLN: 50.0000 ug | INTRAMUSCULAR | Status: DC | PRN

## 2023-12-03 MED ORDER — IBUPROFEN 800 MG PO TABS
800.0000 mg | ORAL_TABLET | Freq: Three times a day (TID) | ORAL | Status: DC
Start: 2023-12-03 — End: 2023-12-05
  Administered 2023-12-03 – 2023-12-05 (×4): 800 mg via ORAL
  Filled 2023-12-03 (×6): qty 1

## 2023-12-03 MED ORDER — SENNOSIDES-DOCUSATE SODIUM 8.6-50 MG PO TABS
2.0000 | ORAL_TABLET | Freq: Every day | ORAL | Status: DC
Start: 2023-12-04 — End: 2023-12-05
  Administered 2023-12-04 – 2023-12-05 (×2): 2 via ORAL
  Filled 2023-12-03 (×2): qty 2

## 2023-12-03 MED ORDER — SIMETHICONE 80 MG PO CHEW
80.0000 mg | CHEWABLE_TABLET | ORAL | Status: DC | PRN
  Administered 2023-12-04: 80 mg via ORAL
  Filled 2023-12-03: qty 1

## 2023-12-03 MED ORDER — LACTATED RINGERS IV SOLN: INTRAVENOUS | Status: DC

## 2023-12-03 MED ORDER — ONDANSETRON HCL 4 MG PO TABS: 4.0000 mg | ORAL_TABLET | ORAL | Status: DC | PRN

## 2023-12-03 MED ORDER — WITCH HAZEL-GLYCERIN EX PADS: 1.0000 | MEDICATED_PAD | CUTANEOUS | Status: DC | PRN

## 2023-12-03 MED ORDER — LACTATED RINGERS IV SOLN: 500.0000 mL | INTRAVENOUS | Status: DC | PRN

## 2023-12-03 MED ORDER — OXYCODONE HCL 5 MG PO TABS
5.0000 mg | ORAL_TABLET | Freq: Four times a day (QID) | ORAL | Status: DC | PRN
  Administered 2023-12-04: 5 mg via ORAL
  Filled 2023-12-03: qty 1

## 2023-12-03 MED ORDER — LIDOCAINE HCL (PF) 1 % IJ SOLN
30.0000 mL | INTRAMUSCULAR | Status: DC | PRN
Start: 2023-12-03 — End: 2023-12-03

## 2023-12-03 MED ORDER — OXYCODONE-ACETAMINOPHEN 5-325 MG PO TABS: 1.0000 | ORAL_TABLET | ORAL | Status: DC | PRN

## 2023-12-03 NOTE — MAU Provider Note (Addendum)
 Event Date/Time   First Provider Initiated Contact with Patient 12/03/23 1210     S: Ms. Rhonda Evans is a 28 y.o. G3P2002 at [redacted]w[redacted]d  who presents to MAU today complaining contractions q 3 minutes since 0700. She denies vaginal bleeding. She denies LOF. She reports normal fetal movement.    O: BP 123/84 (BP Location: Right Arm)   Pulse 93   Temp 98.5 F (36.9 C) (Oral)   Resp 18   Ht 5\' 6"  (1.676 m)   Wt 87.5 kg   LMP 03/02/2023 (Approximate)   SpO2 100%   BMI 31.13 kg/m  GENERAL: Well-developed, well-nourished female in no acute distress.  HEAD: Normocephalic, atraumatic.  CHEST: Normal effort of breathing, regular heart rate ABDOMEN: Soft, nontender, gravid  Cervical exam:  Dilation: 3.5 Effacement (%): 100 Cervical Position: Anterior Station: -2 Presentation: Vertex Exam by:: F. Morris, RNC  Recheck (2:23 PM) Dilation: 5.5 Effacement (%): 100 Cervical Position: Anterior Station: -2 Presentation: Vertex Exam by:: Dr. Daisey Dryer   Fetal Monitoring: Baseline: 150 Variability: moderate Accelerations: 15x15 Decelerations: None Contractions: q 3 minutes  A: SIUP at [redacted]w[redacted]d  Active labor  P: Admit to LD Desires WB, currently medically appropriate for WB Patient reports wanting "something for the pain" and we discussed that water main take her pain down to a coping level Appears to be in transition now  Alerted Dr Cooper Denver and LD charge  Abner Ables, MD 12/03/2023 12:10 PM

## 2023-12-03 NOTE — Progress Notes (Signed)
 Pt states it feels "moist" between her leg.  RN will obtain specimen for fern slide to rule out ROM.

## 2023-12-03 NOTE — Progress Notes (Signed)
 Pt plans to ambulate in MAU unit until recheck @ 1300.  Instructed to inform this RN if SROM or onset of VB.  Pt verbalized understanding and agreeable.

## 2023-12-03 NOTE — Lactation Note (Signed)
 This note was copied from a baby's chart. Lactation Consultation Note  Patient Name: Rhonda Evans [id number] Date: 12/03/2023 Age:27 hours Reason for consult: Initial assessment;Term  P3- MOB reports that infant nurses very well so far. The RN confirmed that infant seems to latch well. Infant had recently fed, so LC encouraged MOB to call out for a latch assessment at some time. MOB requested to be set up with the hospital DEBP because she feels like it helped her supply with the last child. LC set up the DEBP with size 21 mm flanges. LC reviewed how to use the pump and how to clean it. MOB denied having any questions or concerns at this time. LC reviewed the first 24 hr birthday nap, day 2 cluster feeding, feeding infant on cue 8-12x in 24 hrs, not allowing infant to go over 3 hrs without a feeding, CDC milk storage guidelines, LC services handout and engorgement/breast care. LC encouraged MOB to call for further assistance as needed.  Maternal Data Has patient been taught Hand Expression?: Yes Does the patient have breastfeeding experience prior to this delivery?: Yes How long did the patient breastfeed?: breast fed second child for 1 year  Feeding Mother's Current Feeding Choice: Breast Milk  LATCH Score Latch: Repeated attempts needed to sustain latch, nipple held in mouth throughout feeding, stimulation needed to elicit sucking reflex.  Audible Swallowing: Spontaneous and intermittent  Type of Nipple: Everted at rest and after stimulation  Comfort (Breast/Nipple): Soft / non-tender  Hold (Positioning): Assistance needed to correctly position infant at breast and maintain latch.  LATCH Score: 8   Lactation Tools Discussed/Used Tools: Pump;Flanges Flange Size: 21 Breast pump type: Double-Electric Breast Pump;Manual Pump Education: Setup, frequency, and cleaning;Milk Storage Reason for Pumping: MOB request Pumping frequency: 15-20 min every 3  hrs  Interventions Interventions: Breast feeding basics reviewed;Hand pump;DEBP;Education;LC Services brochure  Discharge Discharge Education: Engorgement and breast care;Warning signs for feeding baby Pump: Advised to call insurance company;Manual  Consult Status Consult Status: Follow-up Date: 12/04/23 Follow-up type: In-patient    [name] BS, IBCLC 12/03/2023, 7:00 PM

## 2023-12-03 NOTE — H&P (Signed)
 OBSTETRIC ADMISSION HISTORY AND PHYSICAL  Rhonda Evans is a 28 y.o. female G3P2002 with IUP at [redacted]w[redacted]d by LMP presenting for SOL. She reports +FMs, No LOF, no VB, no blurry vision, headaches or peripheral edema, and RUQ pain.  She plans on breast feeding. She is undecided for birth control. 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   Susceptible to varicella (non-immune), currently pregnant 04/26/2023   UTI (urinary tract infection) during pregnancy, first trimester 07/12/2021   04/25/2023 +UTI rx for keflex sent>> Negative TOC on 07/01/2023     Vaginal Pap smear, abnormal     Past Surgical History: Past Surgical History:  Procedure Laterality Date   CERVICAL BIOPSY      Obstetrical History: OB History     Gravida  3   Para  2   Term  2   Preterm      AB      Living  2      SAB      IAB      Ectopic      Multiple  0   Live Births  2           Social History Social History   Socioeconomic History   Marital status: Single    Spouse name: Horticulturist, commercial   Number of children: 1   Years of education: Not on file   Highest education level: Not on file  Occupational History   Not on file  Tobacco Use    Smoking status: Never   Smokeless tobacco: Never  Vaping Use   Vaping status: Former  Substance and Sexual Activity   Alcohol use: Not Currently   Drug use: Not Currently    Types: Marijuana   Sexual activity: Not Currently    Partners: Male    Birth control/protection: None  Other Topics Concern   Not on file  Social History Narrative   Not on file   Social Drivers of Health   Financial Resource Strain: Not on file  Food Insecurity: Food Insecurity Present (03/29/2021)   Hunger Vital Sign    Worried About Running Out of Food in the Last Year: Never true    Ran Out of Food in the Last Year: Sometimes true  Transportation Needs: Unmet Transportation Needs (03/29/2021)   PRAPARE - Administrator, Civil Service (Medical): Yes    Lack of Transportation (Non-Medical): Yes  Physical Activity: Not on file  Stress: Not on file  Social Connections: Unknown (04/12/2023)   Received from [employer]   Social Network    Social Network: Not on file    Family History:  Family History  Problem Relation Age of Onset   Ovarian cancer Mother    Ovarian cancer Sister    Hypertension Maternal Aunt    Diabetes Maternal Grandmother        great   Hypertension Maternal Grandmother        great   Diabetes Maternal Grandfather    Hypertension Maternal Grandfather    Cancer Maternal Grandfather    Cancer Paternal Grandfather    Hearing loss Neg Hx     Allergies: Allergies  Allergen Reactions   Amoxicillin Anaphylaxis and Rash    Has patient had a PCN reaction causing immediate rash, facial/tongue/throat swelling, SOB or lightheadedness with hypotension: Yes Has patient had a PCN reaction causing severe rash involving mucus membranes or skin necrosis: No Has patient had a PCN reaction that required hospitalization Yes Has patient had a PCN reaction occurring within the last 10 years: No If all of the above answers are "NO", then may proceed with Cephalosporin use.     Penicillins Anaphylaxis, Other (See Comments) and Rash    Has patient had a PCN reaction causing immediate rash, facial/tongue/throat swelling, SOB or lightheadedness with hypotension: Yes Has patient had a PCN reaction causing severe rash involving mucus membranes or skin necrosis: No Has patient had a PCN reaction that required hospitalization Yes Has patient had a PCN reaction occurring within the last 10 years: No If all of the above answers are "NO", then may proceed with Cephalosporin use.  rash   Latex Rash    Medications Prior to Admission  Medication Sig Dispense Refill Last Dose/Taking   Blood Pressure Monitoring (BLOOD PRESSURE KIT) DEVI 1 Device by Does not apply route once a week. (Patient not taking: Reported on 11/29/2023) 1 each 0    famotidine  (PEPCID ) 20 MG tablet Take 1 tablet (20 mg total) by mouth 2 (two) times daily. 60 tablet 3    Ferric Maltol  (ACCRUFER ) 30 MG CAPS Take 1 capsule (30 mg total) by mouth daily. 30 capsule 3    hydrocortisone  (ANUSOL -HC) 2.5 % rectal cream Place rectally 2 (two) times daily. (Patient not taking: Reported on 11/29/2023) 30 g 2    ondansetron  (ZOFRAN -ODT) 4 MG disintegrating tablet Take 1 tablet (4 mg total) by mouth every 8 (eight) hours as needed for nausea or vomiting. (Patient not taking: Reported on 07/01/2023) 20 tablet 0    Prenatal 28-0.8 MG TABS Take 1 tablet by mouth daily. (Patient not taking: Reported on 11/29/2023) 30 tablet 12      Review of Systems   All systems reviewed and negative except as stated in HPI  Blood pressure 114/61, pulse 98, temperature 98.4 F (36.9 C), temperature source Oral, resp. rate 18, height 5\' 6"  (1.676 m), weight 87.5 kg, last menstrual period 03/02/2023, SpO2 100%. General appearance: alert, cooperative, appears stated age, and moderate distress Lungs: clear to auscultation bilaterally Heart: regular rate and rhythm Abdomen: soft, non-tender; bowel sounds normal Pelvic: adequate, proven to  3941g Extremities: Homans sign is negative, no sign of DVT DTR's 2+ Presentation: cephalic Fetal monitoringBaseline: 145 bpm, Variability: Good {> 6 bpm), Accelerations: Reactive, and Decelerations: Absent Uterine activityFrequency: Every 3-4 minutes Dilation: 5.5 Effacement (%): 100 Station: -2 Exam by:: Dr. Daisey Dryer   Prenatal labs: ABO, Rh:  O positive Antibody:  Negative  Rubella:  immune  RPR: Non Reactive (03/25 0833)  HBsAg:   Negative  HIV: Non Reactive (03/25 4098)  GBS: Negative/-- (05/23 1202)    Lab Results  Component Value  Date   GBS Negative 11/15/2023   GTT normal Genetic screening  negative Anatomy US  normal female  Immunization History  Administered Date(s) Administered   Influenza, Mdck, Trivalent,PF 6+ MOS(egg free) 04/25/2023   Influenza, Seasonal, Injecte, Preservative Fre 04/21/2014   Influenza-Unspecified 04/21/2014   Tdap 06/16/2014, 07/10/2021, 09/17/2023    Prenatal Transfer Tool  Maternal Diabetes: No Genetic Screening: Normal Maternal Ultrasounds/Referrals: Normal Fetal Ultrasounds or other Referrals:  None Maternal Substance Abuse:  No Significant Maternal Medications:  None Significant Maternal Lab Results: Group B Strep negative Number of Prenatal Visits:greater than 3 verified prenatal visits Maternal Vaccinations:TDap Other Comments:  None   Results for orders placed or performed during the hospital encounter of 12/03/23 (from the past 24 hours)  Fern Test   Collection Time: 12/03/23  1:15 PM  Result Value Ref Range   POCT Fern Test Negative = intact amniotic membranes     Patient Active Problem List   Diagnosis Date Noted   Anemia 11/13/2023   Supervision of low-risk pregnancy 07/01/2023   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 G3P2002 at [redacted]w[redacted]d here for SOL desiring water birth.   #Labor:Expectant management; AROM if desired. #Pain: Immersion #FWB: Cat I #GBS status:   negative #Feeding: Breastmilk  #Reproductive Life planning: Undecided #Circ:  not applicable  Darrow End, MD  12/03/2023, 2:29 PM

## 2023-12-03 NOTE — MAU Note (Signed)
 Rhonda Evans is a 28 y.o. at [redacted]w[redacted]d here in MAU reporting: she's having ctxs that 4-5 minutes apart that began this morning @ 0700.  Denies VB or LOF.  Reports=FM, less than usual.  LMP: 03/02/2023 Onset of complaint: today Pain score: 8 Vitals:   12/03/23 1016  BP: 123/84  Pulse: 93  Resp: 18  Temp: 98.5 F (36.9 C)  SpO2: 100%     FHT: 148 bpm  Lab orders placed from triage: None

## 2023-12-03 NOTE — Discharge Summary (Signed)
 Postpartum Discharge Summary  Date of Service updated***     Patient Name: Rhonda Evans DOB: Nov 14, 1995 MRN: [id number]  Date of admission: 12/03/2023 Delivery date:12/03/2023 Delivering provider: LEVEQUE, Evans Date of discharge: 12/03/2023  Admitting diagnosis: Normal labor [O80, Z37.9] Intrauterine pregnancy: [redacted]w[redacted]d     Secondary diagnosis:  Principal Problem:   NSVD (normal spontaneous vaginal delivery) Active Problems:   Hemoglobin C trait (HCC)   Supervision of low-risk pregnancy   Anemia   Normal labor  Additional problems: ***    Discharge diagnosis: Term Pregnancy Delivered                                              Post partum procedures:{Postpartum procedures:23558} Augmentation: N/A Complications: None  Hospital course: Onset of Labor With Vaginal Delivery      [name] at [redacted]w[redacted]d was admitted in Active Labor on 12/03/2023. Labor course was complicated by none.  Membrane Rupture Time/Date:  ,   Delivery Method:Vaginal, Spontaneous Operative Delivery:N/A Episiotomy: None Lacerations:  None Patient had a postpartum course complicated by ***.  She is ambulating, tolerating a regular diet, passing flatus, and urinating well. Patient is discharged home in stable condition on 12/03/23.  Newborn Data: Birth date:12/03/2023 Birth time:3:31 PM Gender:Female Living status:Living Apgars:9 ,9  Weight:3490 g  Magnesium Sulfate received: No BMZ received: No Rhophylac:No MMR:No T-DaP:Given prenatally Flu: N/A RSV Vaccine received: No Transfusion:{Transfusion received:30440034}  Immunizations received: Immunization History  Administered Date(s) Administered   Influenza, Mdck, Trivalent,PF 6+ MOS(egg free) 04/25/2023   Influenza, Seasonal, Injecte, Preservative Fre 04/21/2014   Influenza-Unspecified 04/21/2014   Tdap 06/16/2014, 07/10/2021, 09/17/2023    Physical exam  Vitals:   12/03/23 1615 12/03/23 1630 12/03/23 1648 12/03/23 1740  BP:  114/73 121/70 114/66 111/71  Pulse: (!) 110 97 91 86  Resp: 18 18 18 17   Temp:    98.4 F (36.9 C)  TempSrc:    Oral  SpO2:    100%  Weight:      Height:       General: {Exam; general:21111117} Lochia: {Desc; appropriate/inappropriate:30686::"appropriate"} Uterine Fundus: {Desc; firm/soft:30687} Incision: {Exam; incision:21111123} DVT Evaluation: {Exam; dvt:2111122} Labs: Lab Results  Component Value Date   WBC 10.2 12/03/2023   HGB 10.4 (L) 12/03/2023   HCT 33.8 (L) 12/03/2023   MCV 67.3 (L) 12/03/2023   PLT 328 12/03/2023      Latest Ref Rng & Units 12/03/2023    2:49 PM  CMP  Glucose 70 - 99 mg/dL 67   BUN 6 - 20 mg/dL <5   Creatinine 4.09 - 1.00 mg/dL 8.11   Sodium 914 - 782 mmol/L 138   Potassium 3.5 - 5.1 mmol/L 3.2   Chloride 98 - 111 mmol/L 107   CO2 22 - 32 mmol/L 18   Calcium 8.9 - 10.3 mg/dL 9.5   Total Protein 6.5 - 8.1 g/dL 7.4   Total Bilirubin 0.0 - 1.2 mg/dL 1.2   Alkaline Phos 38 - 126 U/L 217   AST 15 - 41 U/L 22   ALT 0 - 44 U/L 11    Edinburgh Score:    11/06/2021    8:51 AM  Edinburgh Postnatal Depression Scale Screening Tool  I have been able to laugh and see the funny side of things. 0  I have looked forward with enjoyment to things. 0  I  have blamed myself unnecessarily when things went wrong. 2  I have been anxious or worried for no good reason. 2  I have felt scared or panicky for no good reason. 2  Things have been getting on top of me. 3  I have been so unhappy that I have had difficulty sleeping. 0  I have felt sad or miserable. 2  I have been so unhappy that I have been crying. 1  The thought of harming myself has occurred to me. 0  Edinburgh Postnatal Depression Scale Total 12   No data recorded  After visit meds:  Allergies as of 12/03/2023       Reactions   Amoxicillin Anaphylaxis, Rash   Has patient had a PCN reaction causing immediate rash, facial/tongue/throat swelling, SOB or lightheadedness with hypotension: Yes Has  patient had a PCN reaction causing severe rash involving mucus membranes or skin necrosis: No Has patient had a PCN reaction that required hospitalization Yes Has patient had a PCN reaction occurring within the last 10 years: No If all of the above answers are "NO", then may proceed with Cephalosporin use.   Penicillins Anaphylaxis, Other (See Comments), Rash   Has patient had a PCN reaction causing immediate rash, facial/tongue/throat swelling, SOB or lightheadedness with hypotension: Yes Has patient had a PCN reaction causing severe rash involving mucus membranes or skin necrosis: No Has patient had a PCN reaction that required hospitalization Yes Has patient had a PCN reaction occurring within the last 10 years: No If all of the above answers are "NO", then may proceed with Cephalosporin use. rash   Latex Rash     Med Rec must be completed prior to using this [facility]***        Discharge home in stable condition Infant Feeding: {Baby feeding:23562} Infant Disposition:{CHL IP OB HOME WITH BJYNWG:95621} Discharge instruction: per After Visit Summary and Postpartum booklet. Activity: Advance as tolerated. Pelvic rest for 6 weeks.  Diet: {OB HYQM:57846962} Future Appointments: Future Appointments  Date Time Provider Department Center  12/05/2023  2:35 PM Rhonda Hidden, FNP [facility]   Follow up Visit:  Message sent to [facility] 6/10  Please schedule this patient for a In person postpartum visit in 6 weeks with the following provider: Any provider. Additional Postpartum F/U:None  Low risk pregnancy complicated by: none Delivery mode:  Vaginal, Spontaneous Anticipated Birth Control:  Unsure   12/03/2023 Rhonda Sorenson, MD

## 2023-12-04 LAB — RPR: RPR Ser Ql: NONREACTIVE

## 2023-12-04 NOTE — Progress Notes (Signed)
 POSTPARTUM PROGRESS NOTE  Post Partum Day 1  Subjective:  Rhonda Evans is a 28 y.o. G3P3003 s/p SVD at [redacted]w[redacted]d.  She reports she is doing well. No acute events overnight. She denies any problems with ambulating, voiding or po intake. Denies nausea or vomiting.  Pain is well controlled.  Lochia is about the same as a period.  Objective: Blood pressure 113/69, pulse 79, temperature 98.3 F (36.8 C), temperature source Oral, resp. rate 16, height 5' 6 (1.676 m), weight 87.5 kg, last menstrual period 03/02/2023, SpO2 100%, unknown if currently breastfeeding.  Physical Exam:  General: alert, cooperative and no distress Chest: no respiratory distress Heart:regular rate, distal pulses intact Uterine Fundus: firm, appropriately tender DVT Evaluation: No calf swelling or tenderness Extremities: Trace bilateral edema Skin: warm, dry  Recent Labs    12/03/23 1449  HGB 10.4*  HCT 33.8*    Assessment/Plan: Rhonda Evans is a 28 y.o. [phone] s/p SVD at [redacted]w[redacted]d   PPD#1 - Doing well  Routine postpartum care  Contraception: Unsure Feeding: Breast Dispo: Plan for discharge tomorrow.   LOS: 1 day   Melanie Spires, MD OB Fellow  12/04/2023, 7:18 AM

## 2023-12-04 NOTE — Progress Notes (Signed)
 Patient complaining of chest pain that radiates to her back. VSS and MD notified. Will continue to monitor.

## 2023-12-04 NOTE — Progress Notes (Signed)
 Called to patient's room to evaluate chest pain. No significant pmh, vaginal delivery last night, reports intermittent chest pain in the location of her xyphoid process radiating to the back. No dyspnea or cough or fever, in no distress, normal vitals including o2 100%, on exam significant tenderness with relatively light palpation to that area (no visible trauma), EKG unremarkable, this appears to be costochondritis perhaps exacerbated by labor position/pushing, will treat symptomatically, monitor closely.

## 2023-12-04 NOTE — Lactation Note (Signed)
 This note was copied from a baby's chart. Lactation Consultation Note  Patient Name: Rhonda Evans [id number] Date: 12/04/2023 Age:26 hours Reason for consult: Follow-up assessment;Term Mom holding baby on couch. Mom stated BF going good. Mom stated she is latching well. Sometimes she just gets on the nipple and mom can tell because it hurt so she re-latches her deeper. Answered all of mom's questions. Mom has been BF and pumping some and giving baby back her BM just to make sure she is getting enough. Experienced BF mom stated she feels like the baby is BF good w/o difficulty. Encouraged mom to call for assistance or further questions. Praised mom for doing a good job. Maternal Data    Feeding    LATCH Score                    Lactation Tools Discussed/Used    Interventions Interventions: Breast feeding basics reviewed;Position options;Education  Discharge    Consult Status Consult Status: Follow-up Date: 12/05/23 Follow-up type: In-patient    Dominie Benedick G 12/04/2023, 10:15 PM

## 2023-12-05 ENCOUNTER — Encounter: Admitting: Obstetrics and Gynecology

## 2023-12-05 ENCOUNTER — Other Ambulatory Visit (HOSPITAL_COMMUNITY): Payer: Self-pay

## 2023-12-05 MED ORDER — SENNOSIDES-DOCUSATE SODIUM 8.6-50 MG PO TABS
2.0000 | ORAL_TABLET | Freq: Every day | ORAL | 0 refills | Status: AC
  Filled 2023-12-05: qty 60, 30d supply, fill #0

## 2023-12-05 MED ORDER — IBUPROFEN 800 MG PO TABS
800.0000 mg | ORAL_TABLET | Freq: Three times a day (TID) | ORAL | 0 refills | Status: DC
Start: 2023-12-05 — End: 2024-04-30
  Filled 2023-12-05: qty 30, 10d supply, fill #0

## 2023-12-05 MED ORDER — BENZOCAINE-MENTHOL 20-0.5 % EX AERO
1.0000 | INHALATION_SPRAY | CUTANEOUS | 3 refills | Status: DC | PRN
  Filled 2023-12-05: qty 78, fill #0

## 2023-12-05 MED ORDER — OXYCODONE HCL 5 MG PO TABS
5.0000 mg | ORAL_TABLET | Freq: Four times a day (QID) | ORAL | 0 refills | Status: AC | PRN
  Filled 2023-12-05: qty 5, 2d supply, fill #0

## 2023-12-05 NOTE — Progress Notes (Signed)
   Outpatient Lactation Booking Note  Visited parent to establish outpatient lactation care. Rhonda Evans and baby are doing well. States breastfeeding and pumping is also going well. Has experience lactating with older children. Prefers to reach out if needing support. OP LC congratulated on birth, discussed outpatient lactation support options and details. Follow up as needed.   Parent declined outpatient lactation care.  Future Appointments  Date Time Provider Department Center  01/15/2024  4:15 PM Anyanwu, Kathrine Paris, MD [facility]   [name], [facility] Center for [facility]

## 2023-12-05 NOTE — Patient Instructions (Signed)
 If interested in an outpatient lactation consult in office or virtually please reach out to us  at [facility] (First Floor) [address] [place] Please call [phone] and press [phone] for lactation.    [name], [facility] Center for [facility]

## 2023-12-05 NOTE — Lactation Note (Signed)
 This note was copied from a baby's chart. Lactation Consultation Note  Patient Name: Rhonda Evans [id number] Date: 12/05/2023 Age:27 hours Reason for consult: Follow-up assessment;Term;Infant weight loss (7 % weight loss,) Per mom mentioned when the baby is latching the baby gets frustrated due to her flat nipples. LC offered to assess breast tissue and had mom compress the areola and noted it to compress well.  LC reviewed steps for latching - 1st breast - hand express, prepump with the hand pump to gently pull the nipple outward and reverse pressure exercise. Mom returned the demo of reverse pressure and noted the tissue to be perfect for latching.  LC unable to assess latch, baby asleep and per mom recently fed.  LC reviewed Breast feeding D/C teaching and the [facility]  resources.   Maternal Data Has patient been taught Hand Expression?: Yes (LC reviewed)  Feeding Mother's Current Feeding Choice: Breast Milk  LATCH Score - 8    Lactation Tools Discussed/Used Tools: Pump;Flanges Flange Size: 21 Breast pump type: Manual Pump Education: Setup, frequency, and cleaning;Milk Storage Reason for Pumping: LC recommended steps for latching, 1st braest  hand express, prepump to prime the milk ducts and reverse pressure  and work with the baby to open wide prior to latch.  Interventions Interventions: Breast feeding basics reviewed;Hand express;Reverse pressure;Breast compression;Hand pump;Education;LC Services brochure;CDC milk storage guidelines;CDC Guidelines for Breast Pump Cleaning  Discharge Discharge Education: Engorgement and breast care;Warning signs for feeding baby Pump: Manual  Consult Status Consult Status: Complete Date: 12/05/23    Richarda Chance 12/05/2023, 11:54 AM

## 2023-12-06 ENCOUNTER — Other Ambulatory Visit (HOSPITAL_COMMUNITY): Payer: Self-pay

## 2023-12-12 ENCOUNTER — Telehealth (HOSPITAL_COMMUNITY): Payer: Self-pay | Admitting: *Deleted

## 2023-12-12 NOTE — Telephone Encounter (Signed)
 Attempted hospital discharge follow-up call. Left message for patient to return RN call with any questions or concerns. Julien Odor, RN, 12/12/23, [phone]

## 2023-12-14 ENCOUNTER — Inpatient Hospital Stay (HOSPITAL_COMMUNITY)

## 2023-12-14 ENCOUNTER — Inpatient Hospital Stay (HOSPITAL_COMMUNITY): Admission: RE | Admit: 2023-12-14 | Payer: Self-pay | Source: Home / Self Care | Admitting: Family Medicine

## 2024-01-15 ENCOUNTER — Encounter: Payer: Self-pay | Admitting: Obstetrics & Gynecology

## 2024-01-15 ENCOUNTER — Ambulatory Visit: Admitting: Obstetrics & Gynecology

## 2024-01-15 ENCOUNTER — Other Ambulatory Visit: Payer: Self-pay

## 2024-01-15 NOTE — Progress Notes (Signed)
 Post Partum Visit Note  Rhonda Evans is a 28 y.o. [id number] female who presents for a postpartum visit. She is 6 weeks postpartum following a normal spontaneous vaginal delivery.  I have fully reviewed the prenatal and intrapartum course. The delivery was at 39w 3d gestational weeks.  Anesthesia: none. Postpartum course has been . Baby is doing well. Baby is feeding by both breast and bottle - Dr. Delores Richard Start. Bleeding staining only. Bowel function is normal. Bladder function is normal. Patient is not sexually active. Contraception method is undecided. Postpartum depression screening: negative.  The pregnancy intention screening data noted above was reviewed. Potential methods of contraception were discussed. The patient elected to proceed with nothing for now.   Edinburgh Postnatal Depression Scale - 01/15/24 1633       Edinburgh Postnatal Depression Scale:  In the Past 7 Days   I have been able to laugh and see the funny side of things. 0    I have looked forward with enjoyment to things. 2    I have blamed myself unnecessarily when things went wrong. 2    I have been anxious or worried for no good reason. 0    I have felt scared or panicky for no good reason. 0    Things have been getting on top of me. 0    I have been so unhappy that I have had difficulty sleeping. 2    I have felt sad or miserable. 1    I have been so unhappy that I have been crying. 1    The thought of harming myself has occurred to me. 0    Edinburgh Postnatal Depression Scale Total 8          Health Maintenance Due  Topic Date Due   HPV VACCINES (1 - 3-dose series) Never done   Hepatitis B Vaccines (1 of 3 - 19+ 3-dose series) Never done   COVID-19 Vaccine (1 - 2024-25 season) Never done    The following portions of the patient's history were reviewed and updated as appropriate: allergies, current medications, past family history, past medical history, past social history, past surgical history, and  problem list.  Review of Systems Pertinent items noted in HPI and remainder of comprehensive ROS otherwise negative.  Objective:  BP 111/74   Pulse 98   Ht 5' 6 (1.676 m)   Wt 168 lb 12.8 oz (76.6 kg)   LMP 03/02/2023 (Approximate)   Breastfeeding Yes   BMI 27.25 kg/m    General:  alert and no distress   Breasts:  not indicated  Lungs: clear to auscultation bilaterally  Heart:  regular rate and rhythm  Abdomen: soft, non-tender; bowel sounds normal; no masses,  no organomegaly   GU exam:  normal       Assessment:   Postpartum care following vaginal delivery  Plan:   Essential components of care per ACOG recommendations:  1.  Mood and well being: Patient with negative depression screening today. Reviewed local resources for support.  - Patient tobacco use? No.   - hx of drug use? No.    2. Infant care and feeding:  -Patient currently breastmilk feeding? Yes. Reviewed importance of draining breast regularly to support lactation.  -Social determinants of health (SDOH) reviewed in EPIC. No concerns.  3. Sexuality, contraception and birth spacing - Patient does not want a pregnancy in the next year.   - Reviewed reproductive life planning. Reviewed contraceptive methods based on pt  preferences and effectiveness.  Patient desired Abstinence today but wants to come back to discuss contraception at a later appointment.  Advised to use condoms for now if needed. - Discussed birth spacing of 18 months  4. Sleep and fatigue -Encouraged family/partner/community support of 4 hrs of uninterrupted sleep to help with mood and fatigue  5. Physical Recovery  - Discussed patients delivery and complications. She describes her labor as good. - Patient had a Vaginal, no problems at delivery. Patient had no laceration. Perineal healing reviewed. Patient expressed understanding - Patient has urinary incontinence? No. - Patient is safe to resume physical and sexual activity  6.  Health  Maintenance - HM due items addressed Yes - Last pap smear  04/25/2023  NIELM, negative HRHPV - Pap smear not done at today's visit.  -Breast Cancer screening indicated? No.     Shaquan Puerta, MD [facility], [facility]

## 2024-02-17 ENCOUNTER — Ambulatory Visit: Admitting: Obstetrics and Gynecology

## 2024-03-13 ENCOUNTER — Ambulatory Visit
Admission: RE | Admit: 2024-03-13 | Discharge: 2024-03-13 | Disposition: A | Discharge status: Home / Self Care | Attending: Physician Assistant | Admitting: Physician Assistant

## 2024-03-13 ENCOUNTER — Other Ambulatory Visit: Payer: Self-pay

## 2024-03-13 VITALS — BP 103/73 | HR 102 | Temp 99.3°F | Resp 18 | Ht 66.0 in

## 2024-03-13 DIAGNOSIS — R110 Nausea: Secondary | ICD-10-CM | POA: Diagnosis present

## 2024-03-13 DIAGNOSIS — Z3202 Encounter for pregnancy test, result negative: Secondary | ICD-10-CM | POA: Diagnosis present

## 2024-03-13 DIAGNOSIS — J029 Acute pharyngitis, unspecified: Secondary | ICD-10-CM | POA: Insufficient documentation

## 2024-03-13 LAB — POCT URINE DIPSTICK
Bilirubin, UA: NEGATIVE
Blood, UA: NEGATIVE
Glucose, UA: NEGATIVE mg/dL
Ketones, POC UA: NEGATIVE mg/dL
Leukocytes, UA: NEGATIVE
Nitrite, UA: NEGATIVE
POC PROTEIN,UA: NEGATIVE
Spec Grav, UA: 1.02 (ref 1.010–1.025)
Urobilinogen, UA: 1 U/dL
pH, UA: 7.5 (ref 5.0–8.0)

## 2024-03-13 LAB — POCT URINE PREGNANCY: Preg Test, Ur: NEGATIVE

## 2024-03-13 LAB — POCT RAPID STREP A (OFFICE): Rapid Strep A Screen: NEGATIVE

## 2024-03-13 LAB — POC COVID19/FLU A&B COMBO
Covid Antigen, POC: NEGATIVE
Influenza A Antigen, POC: NEGATIVE
Influenza B Antigen, POC: NEGATIVE

## 2024-03-13 MED ORDER — LIDOCAINE VISCOUS HCL 2 % MT SOLN: 15.0000 mL | Freq: Four times a day (QID) | OROMUCOSAL | 0 refills | Status: AC | PRN

## 2024-03-13 NOTE — Discharge Instructions (Addendum)
 VISIT SUMMARY:  You came in today with concerns about a possible strep throat. You have a swollen tonsil, pain in the back of your neck, chills, nausea, and body aches, but no fever or other respiratory symptoms. You also mentioned spotting after your last period and that your three-month-old daughter was recently sick with a UTI.  YOUR PLAN:  -ACUTE PHARYNGITIS WITH LEFT TONSILLAR SWELLING: Acute pharyngitis is an inflammation of the throat, often causing pain and swelling. Your rapid strep test was negative, but we are sending a culture to rule this out definitively. In the meantime, you can use viscous lidocaine  for throat pain, do warm salt water gargles, drink tea with honey, and alternate acetaminophen  and ibuprofen  for pain relief. Cough drops may also help. We will update you on the strep culture results and start antibiotics if necessary.  -NAUSEA: Nausea is a feeling of sickness with an inclination to vomit. You can use the ondansetron  that was previously prescribed to you during pregnancy to help manage your nausea.  -BODY ACHES: Body aches are general discomfort and pain in the muscles. Continue to rest and use over-the-counter pain relievers like acetaminophen  or ibuprofen  to help alleviate the discomfort.  INSTRUCTIONS:  We will contact you with the results of your strep culture. If the culture is positive, we will start you on antibiotics. In the meantime, follow the advice given for managing your symptoms. If your symptoms worsen or you develop new symptoms, please contact our office.

## 2024-03-13 NOTE — ED Triage Notes (Signed)
 Pt presents with complaints of nausea x 3 days and a sore throat/posterior neck pain that began last night. Currently rates overall pain in neck a 7/10. OTC Aleve  taken at 1330 today with some improvement/pain relief. Pt does voice concern for the nausea. Currently 3 months postpartum and did have a slip up, possibility of pregnancy.

## 2024-03-13 NOTE — ED Provider Notes (Signed)
 [facility]    CSN: [id number] Arrival date & time: 03/13/24  1531      History   Chief Complaint Chief Complaint  Patient presents with   Sore Throat   Nausea    Approximately 3 days.     HPI Rhonda Evans is a 28 y.o. female.  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   HPI  Discussed the use of AI scribe software for clinical note transcription with the patient, who gave verbal consent to proceed.  The patient presents with concerns of possible strep throat.  She has a swollen tonsil and pain in the back of the neck, raising concerns about strep throat. Chills are present, but there is no fever. No nasal congestion, runny nose, drainage, ear pain, cough, choking, or trouble breathing. Nausea is present, but there is no vomiting or diarrhea. Body aches are reported, and she experienced stomach pain, which has resolved. Aleve  was taken for pain relief.  Spotting occurred after her last period on August 31st. There is no pain during urination, vaginal pain, bleeding, or discharge.  Her three-month-old daughter was recently sick with a UTI, but no similar symptoms have been observed in others around her. She has some Zofran  left over from a previous pregnancy.   Past Medical History:  Diagnosis Date   Anxiety    Breast mass, right 11/06/2021   Depression    Hemoglobin C trait (HCC) 05/01/2023   History of LEEP (loop electrosurgical excision procedure) of cervix complicating pregnancy 04/25/2023   2023 CIN 2 negative margins  Repeat pap 04/25/2023     Will plan  repeat US  around 15-16 weeks to check cervical length.     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   Susceptible to varicella (non-immune), currently pregnant 04/26/2023   UTI (urinary tract infection) during pregnancy, first trimester 07/12/2021   04/25/2023 +UTI rx for keflex sent>> Negative TOC on 07/01/2023     Vaginal Pap smear, abnormal     Patient Active Problem List   Diagnosis Date Noted   Normal labor 12/03/2023   NSVD (normal spontaneous vaginal delivery) 12/03/2023   Anemia 11/13/2023   Supervision of low-risk pregnancy 07/01/2023   Hemoglobin C trait (HCC) 05/01/2023    Past Surgical History:  Procedure Laterality Date   CERVICAL BIOPSY      OB History     Gravida  3   Para  3   Term  3   Preterm      AB      Living  3      SAB      IAB      Ectopic      Multiple  0   Live Births  3            Home Medications    Prior to Admission medications   Medication Sig Start Date End Date Taking? Authorizing Provider  lidocaine  (XYLOCAINE ) 2 % solution Use as directed 15 mLs in the mouth or throat every 6 (six) hours as needed. 03/13/24  Yes Rhonda Osterloh E, PA-C  Blood Pressure Monitoring (BLOOD PRESSURE KIT) DEVI 1 Device by Does not apply route once a week. Patient  not taking: Reported on 01/15/2024 07/01/23   Anyanwu, Ugonna A, MD  ibuprofen  (ADVIL ) 800 MG tablet Take 1 tablet (800 mg total) by mouth every 8 (eight) hours. Patient not taking: Reported on 01/15/2024 12/05/23   Kumar, Agnijita, MD  oxyCODONE  (OXY IR/ROXICODONE ) 5 MG immediate release tablet Take 1 tablet (5 mg total) by mouth every 6 (six) hours as needed (pain scale 4-7). Patient not taking: Reported on 01/15/2024 12/05/23   Jhonny Augustin BROCKS, MD  Prenatal 28-0.8 MG TABS Take 1 tablet by mouth daily. Patient not taking: Reported on 01/15/2024 07/01/23   Anyanwu, Ugonna A, MD  senna-docusate (SENOKOT-S) 8.6-50 MG tablet Take 2 tablets by mouth daily. Patient not taking: Reported on 01/15/2024  12/05/23   Kumar, Agnijita, MD  escitalopram  (LEXAPRO ) 20 MG tablet Take 1 tablet (20 mg total) by mouth daily. Patient not taking: Reported on 06/01/2019 12/04/18 10/27/20  Arfeen, Leni DASEN, MD  norethindrone -ethinyl estradiol  (NECON 0.5/35, 28,) 0.5-35 MG-MCG tablet Take 1 tablet by mouth daily. Patient not taking: Reported on 06/01/2019 10/08/18 10/27/20  Synthia Raisin, CNM    Family History Family History  Problem Relation Age of Onset   Ovarian cancer Mother    Ovarian cancer Sister    Hypertension Maternal Aunt    Diabetes Maternal Grandmother        great   Hypertension Maternal Grandmother        great   Diabetes Maternal Grandfather    Hypertension Maternal Grandfather    Cancer Maternal Grandfather    Cancer Paternal Grandfather    Hearing loss Neg Hx     Social History Social History   Tobacco Use   Smoking status: Never   Smokeless tobacco: Never  Vaping Use   Vaping status: Former  Substance Use Topics   Alcohol use: Not Currently   Drug use: Not Currently    Types: Marijuana     Allergies   Amoxicillin, Penicillins, and Latex   Review of Systems Review of Systems  Constitutional:  Positive for chills. Negative for fever.  HENT:  Positive for sore throat. Negative for congestion, ear pain, postnasal drip and rhinorrhea.   Respiratory:  Negative for cough, choking, shortness of breath and wheezing.   Gastrointestinal:  Positive for abdominal pain (currently denies pain but states she did have right sided abdominal pain) and nausea. Negative for diarrhea and vomiting.  Genitourinary:  Positive for vaginal bleeding (spotting after period). Negative for dysuria, vaginal discharge and vaginal pain.  Musculoskeletal:  Positive for myalgias. Negative for arthralgias.     Physical Exam Triage Vital Signs ED Triage Vitals  Encounter Vitals Group     BP 03/13/24 1559 103/73     Girls Systolic BP Percentile --      Girls Diastolic BP Percentile --      Boys  Systolic BP Percentile --      Boys Diastolic BP Percentile --      Pulse Rate 03/13/24 1559 (!) 102     Resp 03/13/24 1559 18     Temp 03/13/24 1559 99.3 F (37.4 C)     Temp Source 03/13/24 1559 Oral     SpO2 03/13/24 1559 98 %     Weight --      Height 03/13/24 1601 5' 6 (1.676 m)     Head Circumference --      Peak Flow --      Pain Score 03/13/24 1601 7     Pain Loc --  Pain Education --      Exclude from Growth Chart --    No data found.  Updated Vital Signs BP 103/73 (BP Location: Right Arm)   Pulse (!) 102   Temp 99.3 F (37.4 C) (Oral)   Resp 18   Ht 5' 6 (1.676 m)   LMP 02/23/2024 (Approximate)   SpO2 98%   Breastfeeding No   BMI 27.25 kg/m   Visual Acuity Right Eye Distance:   Left Eye Distance:   Bilateral Distance:    Right Eye Near:   Left Eye Near:    Bilateral Near:     Physical Exam Vitals reviewed.  Constitutional:      General: She is awake.     Appearance: Normal appearance. She is well-developed and well-groomed.  HENT:     Head: Normocephalic and atraumatic.     Right Ear: Hearing, tympanic membrane and ear canal normal.     Left Ear: Hearing, tympanic membrane and ear canal normal.     Mouth/Throat:     Lips: Pink.     Mouth: Mucous membranes are moist.     Pharynx: Oropharynx is clear. Uvula midline. Posterior oropharyngeal erythema present. No pharyngeal swelling, oropharyngeal exudate, uvula swelling or postnasal drip.     Tonsils: No tonsillar exudate. 0 on the right. 1+ on the left.  Cardiovascular:     Rate and Rhythm: Normal rate and regular rhythm.     Pulses: Normal pulses.          Radial pulses are 2+ on the right side and 2+ on the left side.     Heart sounds: Normal heart sounds. No murmur heard.    No friction rub. No gallop.  Pulmonary:     Effort: Pulmonary effort is normal.     Breath sounds: Normal breath sounds. No decreased air movement. No decreased breath sounds, wheezing, rhonchi or rales.   Musculoskeletal:     Cervical back: Normal range of motion and neck supple.  Lymphadenopathy:     Head:     Right side of head: No submental, submandibular or preauricular adenopathy.     Left side of head: No submental, submandibular or preauricular adenopathy.     Cervical:     Right cervical: No superficial cervical adenopathy.    Left cervical: No superficial cervical adenopathy.     Upper Body:     Right upper body: No supraclavicular adenopathy.     Left upper body: No supraclavicular adenopathy.  Neurological:     General: No focal deficit present.     Mental Status: She is alert and oriented to person, place, and time.  Psychiatric:        Mood and Affect: Mood normal.        Behavior: Behavior normal. Behavior is cooperative.      UC Treatments / Results  Labs (all labs ordered are listed, but only abnormal results are displayed) Labs Reviewed  POCT URINE DIPSTICK - Abnormal; Notable for the following components:      Result Value   Color, UA straw (*)    Clarity, UA cloudy (*)    All other components within normal limits  CULTURE, GROUP A STREP [facility])  POCT URINE PREGNANCY  POC COVID19/FLU A&B COMBO  POCT RAPID STREP A (OFFICE)    EKG   Radiology No results found.  Procedures Procedures (including critical care time)  Medications Ordered in UC Medications - No data to display  Initial Impression / Assessment and  Plan / UC Course  I have reviewed the triage vital signs and the nursing notes.  Pertinent labs & imaging results that were available during my care of the patient were reviewed by me and considered in my medical decision making (see chart for details).      Final Clinical Impressions(s) / UC Diagnoses   Final diagnoses:  Sore throat  Nausea without vomiting  Pregnancy examination or test, negative result    Acute pharyngitis with left tonsillar swelling Negative rapid strep test with potential for false negative. No white patches,  but left tonsil is mildly swollen. vitals are largely reassuring. Will get strep culture for definitive rule out. COVID and flu testing negative as well. Urine dip negative for signs of UTI or hematuria.  - Send strep culture to check for traditional and other forms of strep - Prescribe viscous lidocaine  for throat pain - Advise warm salt water gargles, tea with honey, and alternating acetaminophen  and ibuprofen  for pain relief - Advise use of cough drops - Update on strep culture results and start antibiotics if necessary  Nausea Nausea without vomiting or diarrhea. Previously prescribed ondansetron  during pregnancy, which she still has available. - Advise use of previously prescribed ondansetron  for nausea     Discharge Instructions      VISIT SUMMARY:  You came in today with concerns about a possible strep throat. You have a swollen tonsil, pain in the back of your neck, chills, nausea, and body aches, but no fever or other respiratory symptoms. You also mentioned spotting after your last period and that your three-month-old daughter was recently sick with a UTI.  YOUR PLAN:  -ACUTE PHARYNGITIS WITH LEFT TONSILLAR SWELLING: Acute pharyngitis is an inflammation of the throat, often causing pain and swelling. Your rapid strep test was negative, but we are sending a culture to rule this out definitively. In the meantime, you can use viscous lidocaine  for throat pain, do warm salt water gargles, drink tea with honey, and alternate acetaminophen  and ibuprofen  for pain relief. Cough drops may also help. We will update you on the strep culture results and start antibiotics if necessary.  -NAUSEA: Nausea is a feeling of sickness with an inclination to vomit. You can use the ondansetron  that was previously prescribed to you during pregnancy to help manage your nausea.  -BODY ACHES: Body aches are general discomfort and pain in the muscles. Continue to rest and use over-the-counter pain relievers  like acetaminophen  or ibuprofen  to help alleviate the discomfort.  INSTRUCTIONS:  We will contact you with the results of your strep culture. If the culture is positive, we will start you on antibiotics. In the meantime, follow the advice given for managing your symptoms. If your symptoms worsen or you develop new symptoms, please contact our office.     ED Prescriptions     Medication Sig Dispense Auth. Provider   lidocaine  (XYLOCAINE ) 2 % solution Use as directed 15 mLs in the mouth or throat every 6 (six) hours as needed. 100 mL Kaliegh Willadsen E, PA-C      PDMP not reviewed this encounter.   Marylene Rocky BRAVO, PA-C 03/13/24 1655

## 2024-03-16 ENCOUNTER — Ambulatory Visit (HOSPITAL_COMMUNITY): Payer: Self-pay

## 2024-03-16 LAB — CULTURE, GROUP A STREP (THRC)

## 2024-03-16 MED ORDER — AZITHROMYCIN 250 MG PO TABS: ORAL_TABLET | ORAL | 0 refills | Status: AC

## 2024-04-12 ENCOUNTER — Other Ambulatory Visit: Payer: Self-pay

## 2024-04-12 ENCOUNTER — Ambulatory Visit
Admission: EM | Admit: 2024-04-12 | Discharge: 2024-04-12 | Disposition: A | Discharge status: Home / Self Care | Attending: Nurse Practitioner | Admitting: Nurse Practitioner

## 2024-04-12 DIAGNOSIS — T23209A Burn of second degree of unspecified hand, unspecified site, initial encounter: Secondary | ICD-10-CM | POA: Insufficient documentation

## 2024-04-12 DIAGNOSIS — R350 Frequency of micturition: Secondary | ICD-10-CM | POA: Insufficient documentation

## 2024-04-12 LAB — POCT URINE PREGNANCY: Preg Test, Ur: NEGATIVE

## 2024-04-12 LAB — POCT URINE DIPSTICK
Bilirubin, UA: NEGATIVE
Glucose, UA: NEGATIVE mg/dL
Leukocytes, UA: NEGATIVE
Nitrite, UA: NEGATIVE
POC PROTEIN,UA: 30 — AB
Spec Grav, UA: 1.025 (ref 1.010–1.025)
Urobilinogen, UA: 0.2 U/dL
pH, UA: 7 (ref 5.0–8.0)

## 2024-04-12 MED ORDER — PHENAZOPYRIDINE HCL 200 MG PO TABS: 200.0000 mg | ORAL_TABLET | ORAL | 0 refills | Status: AC

## 2024-04-12 MED ORDER — NITROFURANTOIN MONOHYD MACRO 100 MG PO CAPS: 100.0000 mg | ORAL_CAPSULE | Freq: Two times a day (BID) | ORAL | 0 refills | Status: AC
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
## 2024-04-12 NOTE — ED Notes (Signed)
 Wound care provided by [employer] NP.
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
## 2024-04-12 NOTE — ED Provider Notes (Signed)
 [facility]    CSN: [id number] Arrival date & time: 04/12/24  1328      History   Chief Complaint Chief Complaint  Patient presents with   Hand Burn   Urinary Frequency    HPI Rhonda Evans is a 28 y.o. female.   Discussed the use of AI scribe software for clinical note transcription with the patient, who gave verbal consent to proceed.   The patient presents with a burn on her hand and symptoms concerning for a urinary tract infection.  The patient presents for evaluation of a burn to her hand sustained on October 12th after accidentally pouring boiling water on it while preparing bottles for her baby. She immediately rinsed the area under cold water. There was no immediate blistering or peeling, but she noticed skin changes the following day along with mild numbness and decreased sensation in the affected area.  She also reports a two-week history of urinary frequency, odor, and pain with urination. She denies lower abdominal pain, vaginal itching, irritation, or discharge. She has not experienced nausea, vomiting, or fever. Her last menstrual period began on October 1st. The patient is sexually active with one female partner over the past three months and does not use condoms.  The following sections of the patient's history were reviewed and updated as appropriate: allergies, current medications, past family history, past medical history, past social history, past surgical history, and problem list.        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 Authorizing Provider  nitrofurantoin , macrocrystal-monohydrate, (MACROBID ) 100 MG capsule Take 1 capsule (100 mg total) by mouth 2 (two) times daily. 04/12/24  Yes Aurel Nguyen, FNP  phenazopyridine (PYRIDIUM) 200 MG tablet Take 1 tablet (200 mg total) by mouth 3 (three) times daily at 8am, 3pm and bedtime for 2 days. 04/12/24 04/14/24 Yes Iola Lukes, FNP  Blood Pressure Monitoring (BLOOD PRESSURE KIT) DEVI 1 Device  by Does not apply route once a week. Patient not taking: Reported on 01/15/2024 07/01/23   Anyanwu, Ugonna A, MD  ibuprofen  (ADVIL ) 800 MG tablet Take 1 tablet (800 mg total) by mouth every 8 (eight) hours. Patient not taking: Reported on 01/15/2024 12/05/23   Kumar, Agnijita, MD  lidocaine  (XYLOCAINE ) 2 % solution Use as directed 15 mLs in the mouth or throat every 6 (six) hours as needed. 03/13/24   Mecum, Erin E, PA-C  oxyCODONE  (OXY IR/ROXICODONE ) 5 MG immediate release tablet Take 1 tablet (5 mg total) by mouth every 6 (six) hours as needed (pain scale 4-7). Patient not taking: Reported on 01/15/2024 12/05/23   Jhonny Augustin BROCKS, MD  Prenatal 28-0.8 MG TABS Take 1 tablet by mouth daily. Patient not  taking: Reported on 01/15/2024 07/01/23   Anyanwu, Ugonna A, MD  senna-docusate (SENOKOT-S) 8.6-50 MG tablet Take 2 tablets by mouth daily. Patient not taking: Reported on 01/15/2024 12/05/23   Kumar, Agnijita, MD  escitalopram  (LEXAPRO ) 20 MG tablet Take 1 tablet (20 mg total) by mouth daily. Patient not taking: Reported on 06/01/2019 12/04/18 10/27/20  Arfeen, Syed T, MD  norethindrone -ethinyl estradiol  (NECON 0.5/35, 28,) 0.5-35 MG-MCG tablet Take 1 tablet by mouth daily. Patient not taking: Reported on 06/01/2019 10/08/18 10/27/20  Synthia Raisin, CNM    Family History Family History  Problem Relation Age of Onset   Ovarian cancer Mother    Ovarian cancer Sister    Hypertension Maternal Aunt    Diabetes Maternal Grandmother        great   Hypertension Maternal Grandmother        great   Diabetes Maternal Grandfather    Hypertension Maternal Grandfather    Cancer Maternal Grandfather    Cancer Paternal Grandfather    Hearing loss Neg Hx     Social History Social History   Tobacco Use   Smoking status: Never   Smokeless tobacco: Never  Vaping Use   Vaping status: Former  Substance Use Topics   Alcohol use: Not Currently   Drug use: Not Currently    Types: Marijuana     Allergies   Amoxicillin, Penicillins, and Latex   Review of Systems Review of Systems  Constitutional:  Negative for fever.  Gastrointestinal:  Positive for abdominal pain (lower, suprapubic cramping). Negative for nausea and vomiting.  Genitourinary:  Positive for frequency. Negative for dysuria, menstrual problem (LMP: 03/27/24) and vaginal discharge.       Urinary odor. No vaginal itching or irritation.   Skin:  Positive for wound.  All other systems reviewed and are negative.    Physical Exam Triage Vital Signs ED Triage Vitals  Encounter Vitals Group     BP 04/12/24 1351 115/75     Girls Systolic BP Percentile --      Girls Diastolic BP Percentile --      Boys Systolic BP Percentile --      Boys  Diastolic BP Percentile --      Pulse Rate 04/12/24 1351 91     Resp 04/12/24 1434 16     Temp 04/12/24 1434 98.3 F (36.8 C)     Temp Source 04/12/24 1434 Oral     SpO2 04/12/24 1351 98 %     Weight 04/12/24 1351 170 lb (77.1 kg)     Height 04/12/24 1351 5' 6 (1.676 m)     Head Circumference --      Peak Flow --      Pain Score 04/12/24  1432 7     Pain Loc --      Pain Education --      Exclude from Growth Chart --    No data found.  Updated Vital Signs BP 115/75 (BP Location: Right Arm)   Pulse 91   Temp 98.3 F (36.8 C) (Oral)   Resp 16   Ht 5' 6 (1.676 m)   Wt 170 lb (77.1 kg)   LMP 03/28/2024 (Approximate)   SpO2 98%   Breastfeeding No   BMI 27.44 kg/m   Visual Acuity Right Eye Distance:   Left Eye Distance:   Bilateral Distance:    Right Eye Near:   Left Eye Near:    Bilateral Near:     Physical Exam Vitals reviewed.  Constitutional:      General: She is awake. She is not in acute distress.    Appearance: Normal appearance. She is well-developed. She is not ill-appearing, toxic-appearing or diaphoretic.  HENT:     Head: Normocephalic.     Right Ear: Hearing normal.     Left Ear: Hearing normal.     Nose: Nose normal.     Mouth/Throat:     Mouth: Mucous membranes are moist.  Eyes:     General: Vision grossly intact.     Conjunctiva/sclera: Conjunctivae normal.  Cardiovascular:     Rate and Rhythm: Normal rate and regular rhythm.     Heart sounds: Normal heart sounds.  Pulmonary:     Effort: Pulmonary effort is normal.     Breath sounds: Normal breath sounds and air entry.  Musculoskeletal:        General: Normal range of motion.     Left hand: Tenderness present. No swelling, deformity, lacerations or bony tenderness. Normal range of motion. Normal strength. Normal sensation. There is no disruption of two-point discrimination. Normal capillary refill. Normal pulse.       Hands:     Cervical back: Normal range of motion and neck supple.      Comments: Left hand with burn wound. Full range of motion with normal strength and intact sensation. Capillary refill normal.    Skin:    General: Skin is warm and dry.     Findings: Burn present.     Comments: Right hand with erythema and shiny appearance over the dorsal aspect, extending to the middle of the left index finger and base of the thumb. No drainage, surrounding erythema, odor, or signs of infection noted. Skin intact without blistering.  Neurological:     General: No focal deficit present.     Mental Status: She is alert and oriented to person, place, and time.  Psychiatric:        Speech: Speech normal.        Behavior: Behavior is cooperative.         UC Treatments / Results  Labs (all labs ordered are listed, but only abnormal results are displayed) Labs Reviewed  POCT URINE DIPSTICK - Abnormal; Notable for the following components:      Result Value   Color, UA brown (*)    Clarity, UA cloudy (*)    Ketones, POC UA trace (5) (*)    Blood, UA large (*)    POC PROTEIN,UA =30 (*)    All other components within normal limits  URINE CULTURE  POCT URINE PREGNANCY    EKG   Radiology No results found.  Procedures Wound Care  Date/Time: 04/12/2024 3:10 PM  Performed by: Jveon Pound,  FNP Authorized by: Iola Lukes, FNP   Consent:    Consent obtained:  Verbal   Consent given by:  Patient   Risks, benefits, and alternatives were discussed: yes     Risks discussed:  Infection and pain Universal protocol:    Patient identity confirmed:  Verbally with patient and arm band Sedation:    Sedation type:  None Anesthesia:    Anesthesia method:  None Procedure details:    Wound age (days):  7   Debridement performed: No   Skin layer closed with:    Wound care performed: silvadene cream applied to burn site of left hand. Dressing:    Dressing applied:  Telfa pad   Wrapped with:  Coban 2 inch Post-procedure details:    Procedure completion:   Tolerated well, no immediate complications  (including critical care time)  Medications Ordered in UC Medications - No data to display  Initial Impression / Assessment and Plan / UC Course  I have reviewed the triage vital signs and the nursing notes.  Pertinent labs & imaging results that were available during my care of the patient were reviewed by me and considered in my medical decision making (see chart for details).     Patient presents with a superficial partial-thickness burn of the left hand sustained on October 12th after contact with boiling water while preparing baby bottles. She initially treated the area with cold water and noted skin changes and mild numbness the following day. Examination reveals a clean, non-infected superficial burn. Silvadene cream prescribed to apply once or twice daily in a thin layer to the affected area after gentle cleansing with mild soap and water, followed by a non-stick dressing. Expected healing time is approximately 7-21 days.  Patient also reports a two-week history of urinary frequency, odor, and suprapubic pressure without vaginal symptoms. Urinalysis shows brown, cloudy urine with large amounts of blood but no nitrites or leukocytes, suggesting a possible early urinary tract infection. Nitrofurantoin  prescribed for infection and Pyridium for symptomatic relief, with counseling that urine discoloration may occur. Urine culture sent for confirmation and antibiotic sensitivity.  Patient advised to follow up with her primary care provider if symptoms persist or worsen. Return to the emergency department for signs of infection, severe pain, fever, spreading redness, or inability to urinate.  Today's evaluation has revealed no signs of a dangerous process. Discussed diagnosis with patient and/or guardian. Patient and/or guardian aware of their diagnosis, possible red flag symptoms to watch out for and need for close follow up. Patient and/or guardian  understands verbal and written discharge instructions. Patient and/or guardian comfortable with plan and disposition.  Patient and/or guardian has a clear mental status at this time, good insight into illness (after discussion and teaching) and has clear judgment to make decisions regarding their care  Documentation was completed with the aid of voice recognition software. Transcription may contain typographical errors.   Final Clinical Impressions(s) / UC Diagnoses   Final diagnoses:  Urinary frequency  Superficial partial thickness burn of hand     Discharge Instructions      You were seen today for a burn on your left hand and possible urinary tract infection. The burn occurred on October 12th after contact with boiling water. Your exam shows a superficial burn, meaning only the top layers of skin are affected, and there are no signs of infection. Apply a thin layer of Silvadene cream once or twice a day using a clean finger or cotton swab. Gently wash  the area with mild soap and water, pat dry, and then apply the cream followed by a non-stick dressing. Keep the area clean and dry. Burns like this usually heal within one to three weeks. You may take Tylenol  or ibuprofen  as needed for pain or discomfort.  You also reported urinary frequency, pressure, and odor, which may be due to an early urinary tract infection. Your urine test did not show clear signs of infection, but a culture was sent to confirm. You have been prescribed an antibiotic (nitrofurantoin ) and a medication called Pyridium to help relieve bladder pressure or discomfort. Pyridium can turn your urine a bright orange color, which is normal and not harmful. Drink plenty of water to stay hydrated and help flush bacteria from your bladder.  Follow up with your primary care provider if your symptoms do not improve after completing the antibiotic, if your hand burn does not show signs of healing within a few weeks, or if you notice any  new concerns. Go to the emergency department immediately if you develop severe pain, spreading redness or swelling around the burn, fever, chills, or pus-like drainage, or if you experience worsening urinary symptoms, severe abdominal or back pain, blood in your urine, or fever.      ED Prescriptions     Medication Sig Dispense Auth. Provider   nitrofurantoin , macrocrystal-monohydrate, (MACROBID ) 100 MG capsule Take 1 capsule (100 mg total) by mouth 2 (two) times daily. 10 capsule Iola Lukes, FNP   phenazopyridine (PYRIDIUM) 200 MG tablet Take 1 tablet (200 mg total) by mouth 3 (three) times daily at 8am, 3pm and bedtime for 2 days. 6 tablet Iola Lukes, FNP      PDMP not reviewed this encounter.   Iola Lukes, [place] 04/12/24 [phone]

## 2024-04-14 ENCOUNTER — Ambulatory Visit (HOSPITAL_COMMUNITY): Payer: Self-pay

## 2024-04-14 LAB — URINE CULTURE: Culture: <10000 — AB

## 2024-04-29 ENCOUNTER — Ambulatory Visit (INDEPENDENT_AMBULATORY_CARE_PROVIDER_SITE_OTHER): Admitting: Clinical

## 2024-04-29 ENCOUNTER — Ambulatory Visit: Admission: EM | Admit: 2024-04-29 | Discharge: 2024-04-29 | Disposition: A | Discharge status: Home / Self Care

## 2024-04-29 ENCOUNTER — Other Ambulatory Visit: Payer: Self-pay

## 2024-04-29 ENCOUNTER — Encounter (HOSPITAL_COMMUNITY): Payer: Self-pay

## 2024-04-29 DIAGNOSIS — Z3201 Encounter for pregnancy test, result positive: Secondary | ICD-10-CM | POA: Insufficient documentation

## 2024-04-29 DIAGNOSIS — F259 Schizoaffective disorder, unspecified: Secondary | ICD-10-CM

## 2024-04-29 DIAGNOSIS — N898 Other specified noninflammatory disorders of vagina: Secondary | ICD-10-CM | POA: Insufficient documentation

## 2024-04-29 LAB — POCT URINE PREGNANCY: Preg Test, Ur: POSITIVE — AB

## 2024-04-29 LAB — POCT URINE DIPSTICK
Bilirubin, UA: NEGATIVE
Glucose, UA: NEGATIVE mg/dL
Ketones, POC UA: NEGATIVE mg/dL
Nitrite, UA: NEGATIVE
POC PROTEIN,UA: NEGATIVE
Spec Grav, UA: 1.025 (ref 1.010–1.025)
Urobilinogen, UA: 0.2 U/dL
pH, UA: 6.5 (ref 5.0–8.0)

## 2024-04-29 NOTE — ED Triage Notes (Signed)
 Pt presents to urgent care for follow-up visit. States she is still having vaginal odor. Took Macrobid  + Pyridium prescriptions. Took the first day and then skipped for several days. Resumed each medication, are now finished.   Has been feeling off and sick for approximately two weeks. Would like a pregnancy test. LMP 10/2.

## 2024-04-29 NOTE — Discharge Instructions (Addendum)
  1. Vaginal odor (Primary) - Cervicovaginal collected in UC and sent to lab for further testing results should be available in 2 to 3 days. - POCT URINE DIPSTICK completed in UC shows trace leukocytes, no nitrite, trace blood, these findings are not strongly indicative of urinary infection.  Will send urine culture for confirmation - Urine Culture collected in UC and sent to lab for further testing results should be available in 2 to 3 days.  2. Urine pregnancy test positive - POCT urine pregnancy completed in UC is positive for pregnancy - hCG, serum, qualitative collected in UC for confirmation of positive urine pregnancy. - Recommend follow-up with gynecology if serum hCG is confirmed positive.  -Continue to monitor symptoms for any change in severity if there is any escalation of current symptoms or development of new symptoms follow-up in ER for further evaluation and management.

## 2024-04-29 NOTE — ED Provider Notes (Addendum)
 [facility]  Note:  This document was prepared using Dragon voice recognition software and may include unintentional dictation errors.  MRN: [id number] DOB: 11-24-1995  Subjective:   Rhonda Evans is a 28 y.o. female presenting for follow-up evaluation after previous treatment for urinary tract infection.  Patient reports that she was seen on October 19 for increased urinary frequency and vaginal odor.  Patient states that she was treated with Pyridium and Macrobid , finished both medications but patient is still having vaginal odor and increased frequency.  Patient also reports that she has not had a menstrual cycle since 10/2 and would like pregnancy testing today in urgent care.  Patient reports that she has a 63-month-old at home but is not currently breast-feeding.  Patient states that she has not felt herself for the past couple of weeks.  Patient denies any shortness of breath, chest pain, weakness, dizziness, nausea/vomiting, syncope.  No current facility-administered medications for this encounter.  Current Outpatient Medications:    Blood Pressure Monitoring (BLOOD PRESSURE KIT) DEVI, 1 Device by Does not apply route once a week. (Patient not taking: Reported on 01/15/2024), Disp: 1 each, Rfl: 0   ibuprofen  (ADVIL ) 800 MG tablet, Take 1 tablet (800 mg total) by mouth every 8 (eight) hours. (Patient not taking: Reported on 01/15/2024), Disp: 30 tablet, Rfl: 0   lidocaine  (XYLOCAINE ) 2 % solution, Use as directed 15 mLs in the mouth or throat every 6 (six) hours as needed., Disp: 100 mL, Rfl: 0   nitrofurantoin , macrocrystal-monohydrate, (MACROBID ) 100 MG capsule, Take 1 capsule (100 mg total) by mouth 2 (two) times daily., Disp: 10 capsule, Rfl: 0   oxyCODONE  (OXY IR/ROXICODONE ) 5 MG immediate release tablet, Take 1 tablet (5 mg total) by mouth every 6 (six) hours as needed (pain scale 4-7). (Patient not taking: Reported on 01/15/2024), Disp: 5 tablet, Rfl: 0   Prenatal  28-0.8 MG TABS, Take 1 tablet by mouth daily. (Patient not taking: Reported on 01/15/2024), Disp: 30 tablet, Rfl: 12   senna-docusate (SENOKOT-S) 8.6-50 MG tablet, Take 2 tablets by mouth daily. (Patient not taking: Reported on 01/15/2024), Disp: 60 tablet, Rfl: 0   Allergies  Allergen Reactions   Amoxicillin Anaphylaxis and Rash    Has patient had a PCN reaction causing immediate rash, facial/tongue/throat swelling, SOB or lightheadedness with hypotension: Yes Has patient had a PCN reaction causing severe rash involving mucus membranes or skin necrosis: No Has patient had a PCN reaction that required hospitalization Yes Has patient had a PCN reaction occurring within the last 10 years: No If all of the above answers are NO, then may proceed with Cephalosporin use.    Penicillins Anaphylaxis, Other (See Comments) and Rash    Has patient had a PCN reaction causing immediate rash, facial/tongue/throat swelling, SOB or lightheadedness with hypotension: Yes Has patient had a PCN reaction causing severe rash involving mucus membranes or skin necrosis: No Has patient had a PCN reaction that required hospitalization Yes Has patient had a PCN reaction occurring within the last 10 years: No If all of the above answers are NO, then may proceed with Cephalosporin use.  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   Susceptible to varicella (non-immune), currently pregnant 04/26/2023   UTI (urinary tract infection) during pregnancy, first trimester 07/12/2021   04/25/2023 +UTI rx for keflex sent>> Negative TOC on 07/01/2023      Vaginal Pap smear, abnormal      Past Surgical History:  Procedure Laterality Date   CERVICAL BIOPSY      Family History  Problem Relation Age of Onset   Ovarian cancer Mother    Ovarian cancer Sister    Hypertension Maternal Aunt    Diabetes Maternal Grandmother        great   Hypertension Maternal Grandmother        great   Diabetes Maternal Grandfather    Hypertension Maternal Grandfather    Cancer Maternal Grandfather    Cancer Paternal Grandfather    Hearing loss Neg Hx     Social History   Tobacco Use   Smoking status: Never   Smokeless tobacco: Never  Vaping Use   Vaping status: Former  Substance Use Topics   Alcohol use: Not Currently   Drug use: Not Currently    Types: Marijuana    ROS Refer to HPI for ROS details.  Objective:   Vitals: BP 119/75 (BP Location: Right Arm)   Pulse 96   Temp 98.7 F (37.1 C) (Oral)   Resp 17   Ht 5' 6 (1.676 m)   Wt 169 lb 15.6 oz (77.1 kg)   LMP 03/26/2024 (Approximate)   SpO2 98%   Breastfeeding No   BMI 27.43 kg/m   Physical Exam Vitals and nursing note reviewed.  Constitutional:      General: She is not in acute distress.    Appearance: Normal appearance. She is well-developed. She is not ill-appearing or toxic-appearing.  HENT:     Head: Normocephalic and atraumatic.  Cardiovascular:     Rate and Rhythm: Normal rate.  Pulmonary:     Effort: Pulmonary effort is normal. No respiratory distress.  Abdominal:     General: There is no distension.     Palpations: Abdomen is soft.     Tenderness: There is no abdominal tenderness. There is no right CVA tenderness or left CVA tenderness.  Genitourinary:    Vagina: No vaginal discharge.  Skin:    General: Skin is warm and dry.  Neurological:     General: No focal deficit present.     Mental Status: She is alert and oriented to person, place, and time.     Cranial Nerves: No cranial nerve deficit.     Sensory: No sensory deficit.     Motor: No weakness.      Coordination: Coordination normal.     Gait: Gait normal.  Psychiatric:        Mood and Affect: Mood normal.        Behavior: Behavior normal.     Procedures  Results for orders placed or performed during the hospital encounter of 04/29/24 (from the past 24 hours)  POCT urine pregnancy     Status: Abnormal   Collection Time: 04/29/24 11:37 AM  Result Value Ref Range   Preg Test, Ur Positive (A) Negative  POCT URINE DIPSTICK     Status: Abnormal   Collection Time: 04/29/24 11:58 AM  Result Value Ref Range   Color, UA yellow yellow   Clarity, UA cloudy (A) clear   Glucose,  UA negative negative mg/dL   Bilirubin, UA negative negative   Ketones, POC UA negative negative mg/dL   Spec Grav, UA 8.974 8.989 - 1.025   Blood, UA trace-intact (A) negative   pH, UA 6.5 5.0 - 8.0   POC PROTEIN,UA negative negative, trace   Urobilinogen, UA 0.2 0.2 or 1.0 E.U./dL   Nitrite, UA Negative Negative   Leukocytes, UA Trace (A) Negative    No results found.   Assessment and Plan :     Discharge Instructions       1. Vaginal odor (Primary) - Cervicovaginal collected in UC and sent to lab for further testing results should be available in 2 to 3 days. - POCT URINE DIPSTICK completed in UC shows trace leukocytes, no nitrite, trace blood, these findings are not strongly indicative of urinary infection.  Will send urine culture for confirmation - Urine Culture collected in UC and sent to lab for further testing results should be available in 2 to 3 days.  2. Urine pregnancy test positive - POCT urine pregnancy completed in UC is positive for pregnancy - hCG, serum, qualitative collected in UC for confirmation of positive urine pregnancy. - Recommend follow-up with gynecology if serum hCG is confirmed positive.  -Continue to monitor symptoms for any change in severity if there is any escalation of current symptoms or development of new symptoms follow-up in ER for further evaluation and  management.      Duran Ohern B Kaydn Kumpf   Ted Leonhart, [place] B, NP 04/29/24 1211    Ameli Sangiovanni B, NP 04/29/24 1213
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
## 2024-04-30 ENCOUNTER — Inpatient Hospital Stay (HOSPITAL_COMMUNITY)

## 2024-04-30 ENCOUNTER — Encounter (HOSPITAL_COMMUNITY): Payer: Self-pay | Admitting: Obstetrics & Gynecology

## 2024-04-30 ENCOUNTER — Ambulatory Visit (INDEPENDENT_AMBULATORY_CARE_PROVIDER_SITE_OTHER): Admitting: Psychiatry

## 2024-04-30 ENCOUNTER — Inpatient Hospital Stay (HOSPITAL_COMMUNITY)
Admission: AD | Admit: 2024-04-30 | Discharge: 2024-05-01 | Disposition: A | Discharge status: Home / Self Care | Attending: Obstetrics & Gynecology | Admitting: Obstetrics & Gynecology

## 2024-04-30 DIAGNOSIS — O3680X0 Pregnancy with inconclusive fetal viability, not applicable or unspecified: Secondary | ICD-10-CM | POA: Insufficient documentation

## 2024-04-30 DIAGNOSIS — R109 Unspecified abdominal pain: Secondary | ICD-10-CM | POA: Insufficient documentation

## 2024-04-30 DIAGNOSIS — F411 Generalized anxiety disorder: Secondary | ICD-10-CM | POA: Insufficient documentation

## 2024-04-30 DIAGNOSIS — F331 Major depressive disorder, recurrent, moderate: Secondary | ICD-10-CM | POA: Diagnosis not present

## 2024-04-30 DIAGNOSIS — Z3A01 Less than 8 weeks gestation of pregnancy: Secondary | ICD-10-CM | POA: Insufficient documentation

## 2024-04-30 DIAGNOSIS — F4310 Post-traumatic stress disorder, unspecified: Secondary | ICD-10-CM | POA: Diagnosis not present

## 2024-04-30 DIAGNOSIS — O26891 Other specified pregnancy related conditions, first trimester: Secondary | ICD-10-CM | POA: Diagnosis not present

## 2024-04-30 DIAGNOSIS — O209 Hemorrhage in early pregnancy, unspecified: Secondary | ICD-10-CM | POA: Insufficient documentation

## 2024-04-30 DIAGNOSIS — O26899 Other specified pregnancy related conditions, unspecified trimester: Secondary | ICD-10-CM

## 2024-04-30 LAB — URINALYSIS, ROUTINE W REFLEX MICROSCOPIC
Bilirubin Urine: NEGATIVE
Glucose, UA: NEGATIVE mg/dL
Hgb urine dipstick: NEGATIVE
Ketones, ur: NEGATIVE mg/dL
Leukocytes,Ua: NEGATIVE
Nitrite: NEGATIVE
Protein, ur: NEGATIVE mg/dL
Specific Gravity, Urine: 1.028 (ref 1.005–1.030)
pH: 5 (ref 5.0–8.0)

## 2024-04-30 LAB — WET PREP, GENITAL
Sperm: NONE SEEN
Trich, Wet Prep: NONE SEEN
WBC, Wet Prep HPF POC: <10 (ref <10)
Yeast Wet Prep HPF POC: NONE SEEN

## 2024-04-30 LAB — CBC
HCT: 34.4 % — ABNORMAL LOW (ref 36.0–46.0)
Hemoglobin: 11.6 g/dL — ABNORMAL LOW (ref 12.0–15.0)
MCH: 24 pg — ABNORMAL LOW (ref 26.0–34.0)
MCHC: 33.7 g/dL (ref 30.0–36.0)
MCV: 71.2 fL — ABNORMAL LOW (ref 80.0–100.0)
Platelets: 497 K/uL — ABNORMAL HIGH (ref 150–400)
RBC: 4.83 MIL/uL (ref 3.87–5.11)
RDW: 16.1 % — ABNORMAL HIGH (ref 11.5–15.5)
WBC: 8.4 K/uL (ref 4.0–10.5)
nRBC: 0 % (ref 0.0–0.2)

## 2024-04-30 LAB — ABO/RH: ABO/RH(D): O POS

## 2024-04-30 MED ORDER — SERTRALINE HCL 25 MG PO TABS: 25.0000 mg | ORAL_TABLET | Freq: Every day | ORAL | 1 refills | Status: DC

## 2024-04-30 NOTE — MAU Note (Addendum)
 Rhonda Evans is a 28 y.o. at [redacted]w[redacted]d here in MAU reporting having abdominal cramps all day - like period cramps. Had bright red spotting one time this am. No recent intercourse. Also having some lower back pain. Mentioned wanting an u/s to be sure everything is ok  LMP: 03/28/24 Onset of complaint: today Pain score: 7 Vitals:   04/30/24 2249 04/30/24 2253  BP:  125/82  Pulse: 95   Resp: 18   Temp: 98.8 F (37.1 C)   SpO2: 100%      FHT: na  Lab orders placed from triage: u/a, wet prep, gc/chlam

## 2024-05-01 ENCOUNTER — Ambulatory Visit (HOSPITAL_COMMUNITY): Payer: Self-pay

## 2024-05-01 DIAGNOSIS — O209 Hemorrhage in early pregnancy, unspecified: Secondary | ICD-10-CM

## 2024-05-01 DIAGNOSIS — Z3A01 Less than 8 weeks gestation of pregnancy: Secondary | ICD-10-CM

## 2024-05-01 LAB — CERVICOVAGINAL ANCILLARY ONLY
Bacterial Vaginitis (gardnerella): POSITIVE — AB
Candida Glabrata: NEGATIVE
Candida Vaginitis: NEGATIVE
Chlamydia: NEGATIVE
Comment: NEGATIVE
Comment: NEGATIVE
Comment: NEGATIVE
Comment: NEGATIVE
Comment: NEGATIVE
Comment: NORMAL
Neisseria Gonorrhea: NEGATIVE
Trichomonas: NEGATIVE

## 2024-05-01 LAB — HCG, QUANTITATIVE, PREGNANCY: hCG, Beta Chain, Quant, S: 14 m[IU]/mL — ABNORMAL HIGH (ref <5)

## 2024-05-01 LAB — GC/CHLAMYDIA PROBE AMP (~~LOC~~) NOT AT ARMC
Chlamydia: NEGATIVE
Comment: NEGATIVE
Comment: NORMAL
Neisseria Gonorrhea: NEGATIVE

## 2024-05-01 LAB — URINE CULTURE
Culture: <10000 — AB
Special Requests: NORMAL

## 2024-05-01 LAB — HCG, SERUM, QUALITATIVE: hCG,Beta Subunit,Qual,Serum: POSITIVE m[IU]/mL — AB (ref <6)

## 2024-05-01 MED ORDER — METRONIDAZOLE 0.75 % VA GEL: 1.0000 | Freq: Every day | VAGINAL | 0 refills | Status: AC

## 2024-05-01 NOTE — MAU Provider Note (Signed)
 History     CSN: [id number]  Arrival date and time: 04/30/24 2240   Event Date/Time   First Provider Initiated Contact with Patient 05/01/24 0140      Chief Complaint  Patient presents with   Abdominal Pain   Abdominal Pain Pertinent negatives include no diarrhea, dysuria, fever, nausea or vomiting.     28 y.o. [id number] [redacted]w[redacted]d here with complaints of vaginal bleeding and cramping that are like period cramps. She reports one episode of bright red bleeding. She reports pain is 7/10 but has not taken medication.     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   Susceptible to varicella (non-immune), currently pregnant 04/26/2023   UTI (urinary tract infection) during pregnancy, first trimester 07/12/2021   04/25/2023 +UTI rx for keflex sent>> Negative TOC on 07/01/2023     Vaginal Pap smear, abnormal     Past Surgical History:  Procedure Laterality Date   CERVICAL BIOPSY      Family History  Problem Relation Age of Onset   Ovarian cancer Mother    Ovarian cancer Sister    Hypertension Maternal Aunt    Diabetes Maternal Grandmother        great   Hypertension Maternal Grandmother        great   Diabetes Maternal Grandfather    Hypertension Maternal Grandfather    Cancer Maternal Grandfather    Cancer Paternal Grandfather    Hearing loss Neg Hx     Social History   Tobacco Use   Smoking status: Never   Smokeless tobacco: Never  Vaping  Use   Vaping status: Former  Substance Use Topics   Alcohol use: Not Currently   Drug use: Not Currently    Types: Marijuana    Allergies:  Allergies  Allergen Reactions   Amoxicillin Anaphylaxis and Rash    Has patient had a PCN reaction causing immediate rash, facial/tongue/throat swelling, SOB or lightheadedness with hypotension: Yes Has patient had a PCN reaction causing severe rash involving mucus membranes or skin necrosis: No Has patient had a PCN reaction that required hospitalization Yes Has patient had a PCN reaction occurring within the last 10 years: No If all of the above answers are NO, then may proceed with Cephalosporin use.    Penicillins Anaphylaxis, Other (See Comments) and Rash    Has patient had a PCN reaction causing immediate rash, facial/tongue/throat swelling, SOB or lightheadedness with hypotension: Yes Has patient had a PCN reaction causing severe rash involving mucus membranes or skin necrosis: No Has patient had a PCN reaction that required hospitalization Yes Has patient had a PCN reaction occurring within the  last 10 years: No If all of the above answers are NO, then may proceed with Cephalosporin use.  rash   Latex Rash    Medications Prior to Admission  Medication Sig Dispense Refill Last Dose/Taking   Blood Pressure Monitoring (BLOOD PRESSURE KIT) DEVI 1 Device by Does not apply route once a week. (Patient not taking: Reported on 01/15/2024) 1 each 0    lidocaine  (XYLOCAINE ) 2 % solution Use as directed 15 mLs in the mouth or throat every 6 (six) hours as needed. 100 mL 0    nitrofurantoin , macrocrystal-monohydrate, (MACROBID ) 100 MG capsule Take 1 capsule (100 mg total) by mouth 2 (two) times daily. 10 capsule 0    oxyCODONE  (OXY IR/ROXICODONE ) 5 MG immediate release tablet Take 1 tablet (5 mg total) by mouth every 6 (six) hours as needed (pain scale 4-7). (Patient not taking: Reported on 01/15/2024) 5 tablet 0    Prenatal 28-0.8 MG TABS Take 1  tablet by mouth daily. (Patient not taking: Reported on 01/15/2024) 30 tablet 12    senna-docusate (SENOKOT-S) 8.6-50 MG tablet Take 2 tablets by mouth daily. (Patient not taking: Reported on 01/15/2024) 60 tablet 0    sertraline (ZOLOFT) 25 MG tablet Take 1 tablet (25 mg total) by mouth daily. 30 tablet 1     Review of Systems  Constitutional:  Negative for chills and fever.  HENT:  Negative for congestion and sore throat.   Eyes:  Negative for pain and visual disturbance.  Respiratory:  Negative for cough, chest tightness and shortness of breath.   Cardiovascular:  Negative for chest pain.  Gastrointestinal:  Positive for abdominal pain. Negative for diarrhea, nausea and vomiting.  Endocrine: Negative for cold intolerance and heat intolerance.  Genitourinary:  Positive for vaginal bleeding. Negative for dysuria and flank pain.  Musculoskeletal:  Negative for back pain.  Skin:  Negative for rash.  Allergic/Immunologic: Negative for food allergies.  Neurological:  Negative for dizziness and light-headedness.  Psychiatric/Behavioral:  Negative for agitation.    Physical Exam   Blood pressure 125/82, pulse 95, temperature 98.8 F (37.1 C), resp. rate 18, height 5' 5 (1.651 m), weight 79.4 kg, last menstrual period 03/26/2024, SpO2 100%, not currently breastfeeding.  Physical Exam Vitals and nursing note reviewed.  Constitutional:      Appearance: Normal appearance.  HENT:     Head: Normocephalic and atraumatic.     Nose: Nose normal.     Mouth/Throat:     Mouth: Mucous membranes are moist.  Eyes:     Conjunctiva/sclera: Conjunctivae normal.  Cardiovascular:     Rate and Rhythm: Normal rate.  Pulmonary:     Effort: Pulmonary effort is normal.  Abdominal:     General: Abdomen is flat.     Palpations: Abdomen is soft.  Musculoskeletal:     Cervical back: Normal range of motion.  Skin:    General: Skin is warm.     Capillary Refill: Capillary refill takes less than 2 seconds.   Neurological:     General: No focal deficit present.     Mental Status: She is alert.  Psychiatric:        Mood and Affect: Mood normal.     MAU Course  Procedures  MDM: moderate  This patient presents to the ED for concern of   Chief Complaint  Patient presents with   Abdominal Pain     These complains involves an extensive number of treatment options, and is a complaint that carries with it  a high risk of complications and morbidity.  The differential diagnosis for  1.vaginal bleeding in early pregnancy INCLUDES threatened miscarriage, ectopic pregnancy (unless IUP confirmed), normal variant bleeding with live IUP-mostly likely subchorionic hemorrhage in this case. Most likely for this patient is normal variant with such a low HCG.   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  The pertinent results include:   Results for orders placed or performed during the hospital encounter of 04/30/24 (from the past 24 hours)  Urinalysis, Routine w reflex microscopic -Urine, Clean Catch     Status: Abnormal   Collection Time: 04/30/24 10:59 PM  Result Value Ref Range   Color, Urine YELLOW YELLOW   APPearance HAZY (A) CLEAR   Specific Gravity, Urine 1.028 1.005 - 1.030   pH 5.0 5.0 - 8.0   Glucose, UA NEGATIVE NEGATIVE mg/dL   Hgb urine dipstick NEGATIVE NEGATIVE   Bilirubin Urine NEGATIVE  NEGATIVE   Ketones, ur NEGATIVE NEGATIVE mg/dL   Protein, ur NEGATIVE NEGATIVE mg/dL   Nitrite NEGATIVE NEGATIVE   Leukocytes,Ua NEGATIVE NEGATIVE   RBC / HPF 0-5 0 - 5 RBC/hpf   WBC, UA 0-5 0 - 5 WBC/hpf   Bacteria, UA RARE (A) NONE SEEN   Squamous Epithelial / HPF 6-10 0 - 5 /HPF   Mucus PRESENT   Wet prep, genital     Status: Abnormal   Collection Time: 04/30/24 11:10 PM   Specimen: Urine, Clean Catch  Result Value Ref Range   Yeast Wet Prep HPF POC NONE SEEN NONE SEEN   Trich, Wet Prep NONE SEEN NONE SEEN   Clue Cells Wet Prep HPF POC PRESENT (A) NONE SEEN   WBC, Wet Prep HPF POC <10 <10   Sperm NONE SEEN   ABO/Rh     Status: None   Collection Time: 04/30/24 11:21 PM  Result Value Ref Range   ABO/RH(D) O POS    No rh immune globuloin      NOT A RH IMMUNE GLOBULIN CANDIDATE, PT RH POSITIVE Performed at [facility], [address] [place], [place] [address]   CBC     Status: Abnormal   Collection Time: 04/30/24 11:23 PM  Result Value Ref Range   WBC 8.4 4.0 - 10.5 K/uL   RBC 4.83 3.87 - 5.11 MIL/uL   Hemoglobin 11.6 (L) 12.0 - 15.0 g/dL   HCT 65.5 (L) 63.9 - 53.9 %   MCV 71.2 (L) 80.0 - 100.0 fL   MCH 24.0 (L) 26.0 - 34.0 pg   MCHC 33.7 30.0 - 36.0 g/dL   RDW 83.8 (H) 88.4 - 84.4 %   Platelets 497 (H) 150 - 400 K/uL   nRBC 0.0 0.0 - 0.2 %  hCG, quantitative, pregnancy     Status: Abnormal   Collection Time: 04/30/24 11:23 PM  Result Value Ref Range   hCG, Beta Chain, Quant, S 14 (H) <5 mIU/mL     Imaging Studies ordered:  I ordered  imaging studies includingTransvaginal US  I independently visualized and interpreted imaging which showed no IUP I agree with the radiologist interpretation   MAU Course:  Updated on results in family room.   After the interventions noted above, I reevaluated the patient and found that they have :improved  Dispostion: discharged   Assessment and Plan   1. Bleeding in early pregnancy   2. Pregnancy of unknown  anatomic location   3. Cramping affecting pregnancy, antepartum   4. [redacted] weeks gestation of pregnancy     - Repeat bHCG in 48 hours - Discharged home stable condition - Ectopic precautions reviewed - If rising bHCG would need repeat US  in 14 days for viability  Suzen Maryan Masters 05/01/2024, 1:40 AM

## 2024-05-01 NOTE — Progress Notes (Signed)
 Dr Eldonna in with pt to discuss test results and d/c plan. Written and verbal d/c instructions given and pt voiced understanding and then d/c home by MD.

## 2024-05-01 NOTE — Discharge Instructions (Signed)
 You were seen for bleeding in early pregnancy. Your ultrasound did not show a pregnancy yet but this is likely because you are very early pregnant. Your pregnancy hormone was barely positive at 14.  We need to repeat this value in approximately 48 hours to see if it is rising.  If your pregnancy hormone level is doubling then we will recommend a repeat ultrasound in the next 14 days.  You should return to the maternity assessment unit for Heavy vaginal bleeding  Severe abdominal pain Dizziness or lightheadedness Or any other pregnancy related concern

## 2024-05-04 ENCOUNTER — Encounter: Payer: Self-pay | Admitting: *Deleted

## 2024-05-04 ENCOUNTER — Other Ambulatory Visit: Payer: Self-pay

## 2024-05-04 ENCOUNTER — Ambulatory Visit (INDEPENDENT_AMBULATORY_CARE_PROVIDER_SITE_OTHER): Payer: Self-pay | Admitting: *Deleted

## 2024-05-04 VITALS — BP 116/77 | HR 79 | Ht 65.0 in | Wt 172.9 lb

## 2024-05-04 DIAGNOSIS — Z3A01 Less than 8 weeks gestation of pregnancy: Secondary | ICD-10-CM

## 2024-05-04 DIAGNOSIS — O3680X0 Pregnancy with inconclusive fetal viability, not applicable or unspecified: Secondary | ICD-10-CM

## 2024-05-04 LAB — BETA HCG QUANT (REF LAB): hCG Quant: 3 m[IU]/mL

## 2024-05-04 NOTE — Progress Notes (Signed)
 Here for stat bhcg. Denies pain. Denies any bleeding today or since left MAU.  Explained we will draw stat bhcg and have her leave office. She will be called with results in a few hours after results received and reviewed by provider. She voices understanding.   [place] [facility] PEAK 4:00 results reviewed with Dr. Nicholaus who advises is a miscarriage and no further tests needed; may offer sab follow up with provider. I called Rhonda Evans and informed her of results and offered sab follow up and information on support group.  She would like appointment. I explained I will send message to front office who will reach out to her about an appointment. She also requested the information on support group which I sent via Mychart. Rock Skip PEAK

## 2024-05-06 ENCOUNTER — Ambulatory Visit: Payer: Self-pay | Admitting: Obstetrics and Gynecology

## 2024-05-23 ENCOUNTER — Ambulatory Visit

## 2024-05-27 ENCOUNTER — Other Ambulatory Visit: Payer: Self-pay | Admitting: Medical Genetics

## 2024-05-29 ENCOUNTER — Ambulatory Visit: Admitting: Certified Nurse Midwife

## 2024-06-02 ENCOUNTER — Ambulatory Visit: Admitting: Obstetrics & Gynecology

## 2024-06-03 ENCOUNTER — Telehealth

## 2024-06-08 ENCOUNTER — Ambulatory Visit: Admitting: Obstetrics and Gynecology
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
## 2024-06-09 ENCOUNTER — Telehealth (INDEPENDENT_AMBULATORY_CARE_PROVIDER_SITE_OTHER): Admitting: Psychiatry

## 2024-06-09 DIAGNOSIS — F4310 Post-traumatic stress disorder, unspecified: Secondary | ICD-10-CM

## 2024-06-09 DIAGNOSIS — F331 Major depressive disorder, recurrent, moderate: Secondary | ICD-10-CM

## 2024-06-09 DIAGNOSIS — F411 Generalized anxiety disorder: Secondary | ICD-10-CM

## 2024-06-09 MED ORDER — ESCITALOPRAM OXALATE 10 MG PO TABS: 10.0000 mg | ORAL_TABLET | Freq: Every day | ORAL | 0 refills | Status: DC

## 2024-06-09 MED ORDER — TRAZODONE HCL 50 MG PO TABS: 50.0000 mg | ORAL_TABLET | Freq: Every evening | ORAL | 0 refills | Status: DC | PRN

## 2024-06-10 ENCOUNTER — Encounter: Admitting: Obstetrics and Gynecology

## 2024-07-03 ENCOUNTER — Telehealth: Payer: Self-pay | Admitting: Family Medicine

## 2024-07-03 NOTE — Telephone Encounter (Signed)
 Patient want to know who can she contact to get a breast reduction

## 2024-07-06 ENCOUNTER — Other Ambulatory Visit: Payer: Self-pay | Admitting: Medical Genetics

## 2024-07-06 DIAGNOSIS — Z006 Encounter for examination for normal comparison and control in clinical research program: Secondary | ICD-10-CM

## 2024-07-06 NOTE — Telephone Encounter (Signed)
 Attempted to return patient call.  Unable to leave message due to mailbox being full.    [place], RN

## 2024-07-09 ENCOUNTER — Ambulatory Visit: Payer: MEDICAID | Admitting: Obstetrics and Gynecology

## 2024-07-23 NOTE — Progress Notes (Unsigned)
 Called patient regarding scheduled appointment today at [phone]. Patient answered. She states she has not been sleeping well with the trazodone  and reports she lost the lexapro . Discussed could send another refill of the lexapro  and she can stop the trazodone  if ineffective. Discussed we can trial hydroxyzine  instead. She was agreeable to this. Rescheduled appointment for March 5th at 10:30am virtual.

## 2024-07-27 ENCOUNTER — Ambulatory Visit (HOSPITAL_COMMUNITY): Admitting: Clinical

## 2024-07-30 ENCOUNTER — Telehealth (HOSPITAL_COMMUNITY): Payer: Self-pay | Admitting: Psychiatry

## 2024-07-30 ENCOUNTER — Encounter (HOSPITAL_COMMUNITY): Payer: Self-pay | Admitting: Psychiatry

## 2024-07-30 DIAGNOSIS — F331 Major depressive disorder, recurrent, moderate: Secondary | ICD-10-CM

## 2024-07-30 DIAGNOSIS — F411 Generalized anxiety disorder: Secondary | ICD-10-CM

## 2024-07-30 MED ORDER — ESCITALOPRAM OXALATE 10 MG PO TABS: 10.0000 mg | ORAL_TABLET | Freq: Every day | ORAL | 0 refills | Status: AC

## 2024-07-30 MED ORDER — HYDROXYZINE HCL 25 MG PO TABS: 25.0000 mg | ORAL_TABLET | Freq: Every evening | ORAL | 0 refills | Status: AC | PRN

## 2024-07-30 NOTE — Telephone Encounter (Signed)
 Called patient regarding scheduled appointment today at [phone]. Patient answered. She states she has not been sleeping well with the trazodone  and reports she lost the lexapro . Discussed could send another refill of the lexapro  and she can stop the trazodone  if ineffective. Discussed we can trial hydroxyzine  instead. She was agreeable to this. Rescheduled appointment for March 5th at 10:30am virtual.

## 2024-08-17 ENCOUNTER — Ambulatory Visit: Payer: Self-pay

## 2024-08-27 ENCOUNTER — Telehealth (HOSPITAL_COMMUNITY): Payer: MEDICAID | Admitting: Psychiatry
